# Patient Record
Sex: Female | Born: 1945
Health system: Southern US, Community
[De-identification: ages and names within clinical notes are randomized; demographics above are authoritative.]

## PROBLEM LIST (undated history)

## (undated) DIAGNOSIS — M109 Gout, unspecified: Secondary | ICD-10-CM

## (undated) DIAGNOSIS — K219 Gastro-esophageal reflux disease without esophagitis: Secondary | ICD-10-CM

## (undated) DIAGNOSIS — G629 Polyneuropathy, unspecified: Secondary | ICD-10-CM

## (undated) DIAGNOSIS — M797 Fibromyalgia: Secondary | ICD-10-CM

## (undated) DIAGNOSIS — F329 Major depressive disorder, single episode, unspecified: Secondary | ICD-10-CM

## (undated) DIAGNOSIS — E785 Hyperlipidemia, unspecified: Secondary | ICD-10-CM

## (undated) DIAGNOSIS — N289 Disorder of kidney and ureter, unspecified: Secondary | ICD-10-CM

## (undated) DIAGNOSIS — M4800 Spinal stenosis, site unspecified: Secondary | ICD-10-CM

## (undated) DIAGNOSIS — E039 Hypothyroidism, unspecified: Secondary | ICD-10-CM

## (undated) DIAGNOSIS — F32A Depression, unspecified: Secondary | ICD-10-CM

## (undated) DIAGNOSIS — I10 Essential (primary) hypertension: Secondary | ICD-10-CM

## (undated) DIAGNOSIS — M1990 Unspecified osteoarthritis, unspecified site: Secondary | ICD-10-CM

## (undated) DIAGNOSIS — F419 Anxiety disorder, unspecified: Secondary | ICD-10-CM

## (undated) DIAGNOSIS — K760 Fatty (change of) liver, not elsewhere classified: Secondary | ICD-10-CM

## (undated) HISTORY — PX: LUMBAR FUSION: SHX111

## (undated) HISTORY — DX: Anxiety disorder, unspecified: F41.9

## (undated) HISTORY — DX: Gout, unspecified: M10.9

## (undated) HISTORY — DX: Disorder of kidney and ureter, unspecified: N28.9

## (undated) HISTORY — PX: COLONOSCOPY: SHX174

## (undated) HISTORY — DX: Spinal stenosis, site unspecified: M48.00

## (undated) HISTORY — DX: Gastro-esophageal reflux disease without esophagitis: K21.9

## (undated) HISTORY — DX: Hyperlipidemia, unspecified: E78.5

## (undated) HISTORY — DX: Hypothyroidism, unspecified: E03.9

---

## 1997-04-12 ENCOUNTER — Ambulatory Visit (HOSPITAL_COMMUNITY): Admission: RE | Admit: 1997-04-12 | Discharge: 1997-04-12 | Payer: Self-pay | Admitting: Obstetrics and Gynecology

## 1997-10-18 ENCOUNTER — Ambulatory Visit (HOSPITAL_COMMUNITY): Admission: RE | Admit: 1997-10-18 | Discharge: 1997-10-18 | Payer: Self-pay | Admitting: *Deleted

## 1998-03-23 ENCOUNTER — Other Ambulatory Visit: Admission: RE | Admit: 1998-03-23 | Discharge: 1998-03-23 | Payer: Self-pay | Admitting: *Deleted

## 1998-07-12 ENCOUNTER — Ambulatory Visit (HOSPITAL_COMMUNITY): Admission: RE | Admit: 1998-07-12 | Discharge: 1998-07-12 | Payer: Self-pay | Admitting: *Deleted

## 1998-07-12 ENCOUNTER — Encounter: Payer: Self-pay | Admitting: *Deleted

## 1999-04-25 ENCOUNTER — Other Ambulatory Visit: Admission: RE | Admit: 1999-04-25 | Discharge: 1999-04-25 | Payer: Self-pay | Admitting: Obstetrics and Gynecology

## 1999-07-31 ENCOUNTER — Encounter: Payer: Self-pay | Admitting: *Deleted

## 1999-07-31 ENCOUNTER — Ambulatory Visit (HOSPITAL_COMMUNITY): Admission: RE | Admit: 1999-07-31 | Discharge: 1999-07-31 | Payer: Self-pay | Admitting: *Deleted

## 2000-03-15 ENCOUNTER — Other Ambulatory Visit: Admission: RE | Admit: 2000-03-15 | Discharge: 2000-03-15 | Payer: Self-pay | Admitting: *Deleted

## 2000-06-11 ENCOUNTER — Ambulatory Visit (HOSPITAL_COMMUNITY): Admission: RE | Admit: 2000-06-11 | Discharge: 2000-06-11 | Payer: Self-pay | Admitting: *Deleted

## 2000-08-07 ENCOUNTER — Encounter: Payer: Self-pay | Admitting: *Deleted

## 2000-08-07 ENCOUNTER — Ambulatory Visit (HOSPITAL_COMMUNITY): Admission: RE | Admit: 2000-08-07 | Discharge: 2000-08-07 | Payer: Self-pay | Admitting: *Deleted

## 2001-04-09 ENCOUNTER — Other Ambulatory Visit: Admission: RE | Admit: 2001-04-09 | Discharge: 2001-04-09 | Payer: Self-pay | Admitting: *Deleted

## 2001-09-11 ENCOUNTER — Ambulatory Visit (HOSPITAL_COMMUNITY): Admission: RE | Admit: 2001-09-11 | Discharge: 2001-09-11 | Payer: Self-pay | Admitting: *Deleted

## 2001-09-11 ENCOUNTER — Encounter: Payer: Self-pay | Admitting: *Deleted

## 2001-10-07 ENCOUNTER — Encounter: Payer: Self-pay | Admitting: Family Medicine

## 2001-10-07 ENCOUNTER — Ambulatory Visit (HOSPITAL_COMMUNITY): Admission: RE | Admit: 2001-10-07 | Discharge: 2001-10-07 | Payer: Self-pay | Admitting: Family Medicine

## 2002-05-27 ENCOUNTER — Other Ambulatory Visit: Admission: RE | Admit: 2002-05-27 | Discharge: 2002-05-27 | Payer: Self-pay | Admitting: *Deleted

## 2002-09-28 ENCOUNTER — Encounter: Payer: Self-pay | Admitting: *Deleted

## 2002-09-28 ENCOUNTER — Ambulatory Visit (HOSPITAL_COMMUNITY): Admission: RE | Admit: 2002-09-28 | Discharge: 2002-09-28 | Payer: Self-pay | Admitting: *Deleted

## 2003-06-16 ENCOUNTER — Other Ambulatory Visit: Admission: RE | Admit: 2003-06-16 | Discharge: 2003-06-16 | Payer: Self-pay | Admitting: *Deleted

## 2003-10-08 ENCOUNTER — Ambulatory Visit (HOSPITAL_COMMUNITY): Admission: RE | Admit: 2003-10-08 | Discharge: 2003-10-08 | Payer: Self-pay | Admitting: *Deleted

## 2004-07-18 ENCOUNTER — Other Ambulatory Visit: Admission: RE | Admit: 2004-07-18 | Discharge: 2004-07-18 | Payer: Self-pay | Admitting: *Deleted

## 2004-07-20 ENCOUNTER — Encounter: Admission: RE | Admit: 2004-07-20 | Discharge: 2004-07-20 | Payer: Self-pay | Admitting: Orthopedic Surgery

## 2004-12-07 ENCOUNTER — Ambulatory Visit (HOSPITAL_COMMUNITY): Admission: RE | Admit: 2004-12-07 | Discharge: 2004-12-07 | Payer: Self-pay | Admitting: *Deleted

## 2005-08-09 ENCOUNTER — Other Ambulatory Visit: Admission: RE | Admit: 2005-08-09 | Discharge: 2005-08-09 | Payer: Self-pay | Admitting: Obstetrics & Gynecology

## 2005-08-31 ENCOUNTER — Ambulatory Visit (HOSPITAL_COMMUNITY): Admission: RE | Admit: 2005-08-31 | Discharge: 2005-08-31 | Payer: Self-pay | Admitting: Obstetrics & Gynecology

## 2005-10-05 LAB — HM COLONOSCOPY

## 2005-12-20 ENCOUNTER — Ambulatory Visit (HOSPITAL_COMMUNITY): Admission: RE | Admit: 2005-12-20 | Discharge: 2005-12-20 | Payer: Self-pay | Admitting: Obstetrics & Gynecology

## 2006-08-22 ENCOUNTER — Other Ambulatory Visit: Admission: RE | Admit: 2006-08-22 | Discharge: 2006-08-22 | Payer: Self-pay | Admitting: Obstetrics & Gynecology

## 2006-12-24 ENCOUNTER — Ambulatory Visit (HOSPITAL_COMMUNITY): Admission: RE | Admit: 2006-12-24 | Discharge: 2006-12-24 | Payer: Self-pay | Admitting: Obstetrics & Gynecology

## 2007-08-28 ENCOUNTER — Other Ambulatory Visit: Admission: RE | Admit: 2007-08-28 | Discharge: 2007-08-28 | Payer: Self-pay | Admitting: Obstetrics & Gynecology

## 2007-10-27 ENCOUNTER — Other Ambulatory Visit: Admission: RE | Admit: 2007-10-27 | Discharge: 2007-10-27 | Payer: Self-pay | Admitting: Obstetrics & Gynecology

## 2007-12-25 ENCOUNTER — Ambulatory Visit (HOSPITAL_COMMUNITY): Admission: RE | Admit: 2007-12-25 | Discharge: 2007-12-25 | Payer: Self-pay | Admitting: Family Medicine

## 2008-02-16 ENCOUNTER — Encounter: Admission: RE | Admit: 2008-02-16 | Discharge: 2008-02-16 | Payer: Self-pay | Admitting: Specialist

## 2008-02-19 ENCOUNTER — Encounter: Admission: RE | Admit: 2008-02-19 | Discharge: 2008-02-19 | Payer: Self-pay | Admitting: Specialist

## 2008-03-19 ENCOUNTER — Other Ambulatory Visit: Admission: RE | Admit: 2008-03-19 | Discharge: 2008-03-19 | Payer: Self-pay | Admitting: Obstetrics & Gynecology

## 2008-06-16 ENCOUNTER — Other Ambulatory Visit: Admission: RE | Admit: 2008-06-16 | Discharge: 2008-06-16 | Payer: Self-pay | Admitting: Obstetrics & Gynecology

## 2008-07-23 ENCOUNTER — Encounter: Admission: RE | Admit: 2008-07-23 | Discharge: 2008-07-23 | Payer: Self-pay | Admitting: Orthopedic Surgery

## 2008-09-27 ENCOUNTER — Observation Stay (HOSPITAL_COMMUNITY): Admission: EM | Admit: 2008-09-27 | Discharge: 2008-09-27 | Payer: Self-pay | Admitting: Emergency Medicine

## 2008-12-03 HISTORY — PX: CERVICAL BIOPSY  W/ LOOP ELECTRODE EXCISION: SUR135

## 2008-12-17 ENCOUNTER — Ambulatory Visit (HOSPITAL_COMMUNITY): Admission: RE | Admit: 2008-12-17 | Discharge: 2008-12-17 | Payer: Self-pay | Admitting: Obstetrics & Gynecology

## 2008-12-28 ENCOUNTER — Ambulatory Visit (HOSPITAL_COMMUNITY): Admission: RE | Admit: 2008-12-28 | Discharge: 2008-12-28 | Payer: Self-pay | Admitting: Obstetrics & Gynecology

## 2009-08-24 ENCOUNTER — Encounter: Admission: RE | Admit: 2009-08-24 | Discharge: 2009-08-24 | Payer: Self-pay | Admitting: Nurse Practitioner

## 2009-11-11 ENCOUNTER — Ambulatory Visit (HOSPITAL_COMMUNITY): Admission: RE | Admit: 2009-11-11 | Discharge: 2009-11-11 | Payer: Self-pay | Admitting: Endocrinology

## 2009-11-23 ENCOUNTER — Encounter: Admission: RE | Admit: 2009-11-23 | Discharge: 2009-11-23 | Payer: Self-pay | Admitting: Endocrinology

## 2009-11-23 ENCOUNTER — Other Ambulatory Visit: Admission: RE | Admit: 2009-11-23 | Discharge: 2009-11-23 | Payer: Self-pay | Admitting: Interventional Radiology

## 2009-12-29 ENCOUNTER — Ambulatory Visit (HOSPITAL_COMMUNITY): Admission: RE | Admit: 2009-12-29 | Discharge: 2009-12-29 | Payer: Self-pay | Admitting: Obstetrics & Gynecology

## 2010-03-25 ENCOUNTER — Other Ambulatory Visit: Payer: Self-pay | Admitting: Endocrinology

## 2010-03-25 DIAGNOSIS — E041 Nontoxic single thyroid nodule: Secondary | ICD-10-CM

## 2010-06-11 LAB — URINALYSIS, ROUTINE W REFLEX MICROSCOPIC
Bilirubin Urine: NEGATIVE
Glucose, UA: NEGATIVE mg/dL
Hgb urine dipstick: NEGATIVE
Ketones, ur: NEGATIVE mg/dL
Nitrite: NEGATIVE
Protein, ur: NEGATIVE mg/dL
Specific Gravity, Urine: 1.019 (ref 1.005–1.030)
Urobilinogen, UA: 0.2 mg/dL (ref 0.0–1.0)
pH: 5.5 (ref 5.0–8.0)

## 2010-06-11 LAB — CBC
HCT: 36.4 % (ref 36.0–46.0)
Hemoglobin: 12.8 g/dL (ref 12.0–15.0)
MCHC: 35.1 g/dL (ref 30.0–36.0)
MCV: 92.4 fL (ref 78.0–100.0)
Platelets: 200 10*3/uL (ref 150–400)
RBC: 3.94 MIL/uL (ref 3.87–5.11)
RDW: 13.2 % (ref 11.5–15.5)
WBC: 3.3 10*3/uL — ABNORMAL LOW (ref 4.0–10.5)

## 2010-06-11 LAB — URINE MICROSCOPIC-ADD ON

## 2010-06-11 LAB — DIFFERENTIAL
Basophils Absolute: 0 10*3/uL (ref 0.0–0.1)
Basophils Relative: 1 % (ref 0–1)
Eosinophils Absolute: 0.3 10*3/uL (ref 0.0–0.7)
Eosinophils Relative: 8 % — ABNORMAL HIGH (ref 0–5)
Lymphocytes Relative: 25 % (ref 12–46)
Lymphs Abs: 0.8 10*3/uL (ref 0.7–4.0)
Monocytes Absolute: 0.4 10*3/uL (ref 0.1–1.0)
Monocytes Relative: 12 % (ref 3–12)
Neutro Abs: 1.8 10*3/uL (ref 1.7–7.7)
Neutrophils Relative %: 54 % (ref 43–77)

## 2010-06-11 LAB — POCT I-STAT, CHEM 8
BUN: 15 mg/dL (ref 6–23)
Calcium, Ion: 1.24 mmol/L (ref 1.12–1.32)
Chloride: 103 mEq/L (ref 96–112)
Creatinine, Ser: 1.2 mg/dL (ref 0.4–1.2)
Glucose, Bld: 69 mg/dL — ABNORMAL LOW (ref 70–99)
HCT: 37 % (ref 36.0–46.0)
Hemoglobin: 12.6 g/dL (ref 12.0–15.0)
Potassium: 3.9 mEq/L (ref 3.5–5.1)
Sodium: 139 mEq/L (ref 135–145)
TCO2: 26 mmol/L (ref 0–100)

## 2010-06-11 LAB — CULTURE, BLOOD (ROUTINE X 2)
Culture: NO GROWTH
Culture: NO GROWTH

## 2010-06-11 LAB — URINE CULTURE

## 2010-06-11 LAB — POCT CARDIAC MARKERS
CKMB, poc: <1 ng/mL — ABNORMAL LOW (ref 1.0–8.0)
CKMB, poc: <1 ng/mL — ABNORMAL LOW (ref 1.0–8.0)
Myoglobin, poc: 66.9 ng/mL (ref 12–200)
Myoglobin, poc: 72.3 ng/mL (ref 12–200)
Troponin i, poc: <0.05 ng/mL (ref 0.00–0.09)
Troponin i, poc: <0.05 ng/mL (ref 0.00–0.09)

## 2010-06-11 LAB — D-DIMER, QUANTITATIVE: D-Dimer, Quant: 0.36 ug/mL-FEU (ref 0.00–0.48)

## 2010-06-11 LAB — CK TOTAL AND CKMB (NOT AT ARMC)
CK, MB: 1.6 ng/mL (ref 0.3–4.0)
CK, MB: 1.7 ng/mL (ref 0.3–4.0)
Relative Index: INVALID (ref 0.0–2.5)
Relative Index: INVALID (ref 0.0–2.5)
Total CK: 67 U/L (ref 7–177)
Total CK: 70 U/L (ref 7–177)

## 2010-06-11 LAB — TROPONIN I
Troponin I: 0.01 ng/mL (ref 0.00–0.06)
Troponin I: <0.01 ng/mL (ref 0.00–0.06)

## 2010-06-11 LAB — HEPATIC FUNCTION PANEL
ALT: 48 U/L — ABNORMAL HIGH (ref 0–35)
AST: 48 U/L — ABNORMAL HIGH (ref 0–37)
Albumin: 4 g/dL (ref 3.5–5.2)
Alkaline Phosphatase: 50 U/L (ref 39–117)
Bilirubin, Direct: 0.2 mg/dL (ref 0.0–0.3)
Indirect Bilirubin: 0.8 mg/dL (ref 0.3–0.9)
Total Bilirubin: 1 mg/dL (ref 0.3–1.2)
Total Protein: 6.3 g/dL (ref 6.0–8.3)

## 2010-06-11 LAB — CARDIAC PANEL(CRET KIN+CKTOT+MB+TROPI)
CK, MB: 1.3 ng/mL (ref 0.3–4.0)
Relative Index: INVALID (ref 0.0–2.5)
Total CK: 55 U/L (ref 7–177)
Troponin I: 0.01 ng/mL (ref 0.00–0.06)

## 2010-06-11 LAB — TSH: TSH: 1.886 u[IU]/mL (ref 0.350–4.500)

## 2010-06-11 LAB — GLUCOSE, CAPILLARY: Glucose-Capillary: 101 mg/dL — ABNORMAL HIGH (ref 70–99)

## 2010-07-18 NOTE — H&P (Signed)
NAME:  Patricia Parks, Patricia Parks NO.:  [id number]   MEDICAL RECORD NO.:  [id number]          PATIENT TYPE:  EMS   LOCATION:  [facility]                         FACILITY:  [facility]   PHYSICIAN:  Hollice Espy, M.D.DATE OF BIRTH:  [birth year]   DATE OF ADMISSION:  09/27/2008  DATE OF DISCHARGE:                              HISTORY & PHYSICAL   PRIMARY CARE PHYSICIAN:  Robert A. Nicholos Johns, MD   CHIEF COMPLAINT:  Chest pain.   HISTORY OF PRESENT ILLNESS:  Patricia Parks is a 65 year old white female  with past medical history of hypertension, fibromyalgia, and depression  who presented to her primary care physician's office this morning with  complaints of chest pain.  The patient reports she awoke this morning  with dull pressure-like sensation in the midsternal region.  Pain was  worse with deep inspiration.  Pain was also associated with shortness of  breath and diaphoresis which has now resolved.  The patient denies any  nausea.  She does report fatigue and myalgias x1 week which she has  attributed to her fibromyalgia.  The patient's chest pressure did mildly  improve with sublingual nitroglycerin given in the emergency room.  The  patient will be admitted at this time for further evaluation and  treatment.   PAST MEDICAL HISTORY:  1. Fibromyalgia.  2. Depression, followed by Dr. Evelene Croon.  3. Hypertension.  4. Spinal stenosis followed by Dr. Jillyn Hidden.   MEDICATIONS:  1. Wellbutrin SR 150 mg tablets 3 times p.o. q.a.m.  2. Cymbalta 60 mg p.o. q.a.m.  3. Lyrica 100 mg p.o. b.i.d.  4. Toprol-XL 50 mg p.o. daily.  5. Maxzide 37.5-25 mg p.o. daily.   ALLERGIES:  No known drug allergies.   FAMILY HISTORY:  The patient's mother is alive and 40 years old with  history of hypertension.  The patient's father deceased at 21 years old  with severe Alzheimer dementia.  No family history of CAD per patient  report.   SOCIAL HISTORY:  No history of tobacco use.  The patient reports  occasional EtOH.  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   REVIEW OF SYSTEMS:  As stated in HPI, otherwise negative.   PHYSICAL EXAMINATION:  VITAL SIGNS:  Blood pressure 109/60, heart rate  67, respirations 20, temperature 97.5, and O2 sat is 99% on 2 L.  GENERAL:  This is a well-nourished, well-developed white female in no  acute distress.  HEAD:  Normocephalic and atraumatic.  EYES:  Pupils are equal, round, and reactive to light.  Extraocular  movements are intact.  No scleral icterus or injection.  EARS, NOSE, AND THROAT:  Mucous membranes are moist.  No oropharyngeal  lesions.  NECK:  Supple without any thyromegaly or lymphadenopathy.  No JVD or  carotid bruits.  CHEST:  Symmetrical movement.  Nontender to palpation.  Note, unable to  reproduce midsternal chest pain.  CARDIOVASCULAR:  S1-S2.  Regular rate and rhythm.  No murmurs, rubs, or  gallops.  No lower extremity edema.  RESPIRATORY:  Lung sounds are clear to auscultation bilaterally.  No  wheezes, rales, or crackles.  No increased work of breathing.  GI:  Abdomen is obese, soft, nontender, and nondistended with positive  bowel sounds.  No appreciated masses or hepatosplenomegaly.  MUSCULOSKELETAL:  No joint deformity or swelling.  NEUROLOGIC:  The patient is able to move all extremities x4 without any  motor or sensory deficits.  PSYCHOLOGIC:  The patient is alert and oriented x3 with normal mood and  affect.  SKIN:  Without any suspicious rashes or lesions.   LABORATORY WORK AND X-RAYS:  CBC and BMET within normal limits.  Urinalysis pending at time of admission.  Cardiac point-of-care markers  are negative x2.  D-dimer within normal limits.  Chest x-ray with no  acute findings.   EKG:  Normal sinus rhythm at 66 beats per minute with no signs of  ischemia, however, there are no old available EKGs for comparison.   IMPRESSION AND PLAN:  1. Chest pain.  The patient with both typical and atypical features,       however, given the patient's age and history of hypertension, we      will admit overnight to Telemetry Unit to rule out myocardial      infarction.  We will cycle cardiac enzymes x3, check TSH, and we      will check the patient's fasting lipid profile in the morning for      risk stratification.  If inpatient workup is negative, we would      arrange for outpatient Cardiolite study per primary care physician.      As stated above, the patient's D-dimer, chest x-ray, and EKG are      all within normal limits.  2. Fibromyalgia.  We will continue the patient's home medications,      possible contributing factor to chest pain.  3. Depression.  Stable.  We will continue home medications.  4. Hypertension.  Stable.  We will continue home medications.  5. Prophylaxis.  We will order for PPI and DVT prophylaxis with      unfractionated heparin.      Cordelia Pen, NP      Hollice Espy, M.D.  Electronically Signed    LE/MEDQ  D:  09/27/2008  T:  09/28/2008  Job:  [id number]   cc:   Molly Maduro A. Nicholos Johns, M.D.

## 2010-08-16 ENCOUNTER — Ambulatory Visit
Admission: RE | Admit: 2010-08-16 | Discharge: 2010-08-16 | Disposition: A | Discharge status: Home / Self Care | Payer: PRIVATE HEALTH INSURANCE | Source: Ambulatory Visit | Attending: Endocrinology | Admitting: Endocrinology

## 2010-08-16 DIAGNOSIS — E041 Nontoxic single thyroid nodule: Secondary | ICD-10-CM

## 2010-08-21 ENCOUNTER — Other Ambulatory Visit: Payer: Self-pay | Admitting: Family Medicine

## 2010-08-21 DIAGNOSIS — R071 Chest pain on breathing: Secondary | ICD-10-CM

## 2010-08-28 ENCOUNTER — Ambulatory Visit
Admission: RE | Admit: 2010-08-28 | Discharge: 2010-08-28 | Disposition: A | Discharge status: Home / Self Care | Payer: PRIVATE HEALTH INSURANCE | Source: Ambulatory Visit | Attending: Family Medicine | Admitting: Family Medicine

## 2010-08-28 DIAGNOSIS — R071 Chest pain on breathing: Secondary | ICD-10-CM

## 2010-12-26 ENCOUNTER — Other Ambulatory Visit: Payer: Self-pay | Admitting: Neurology

## 2010-12-26 DIAGNOSIS — F441 Dissociative fugue: Secondary | ICD-10-CM

## 2011-01-01 ENCOUNTER — Other Ambulatory Visit: Payer: Self-pay | Admitting: Obstetrics & Gynecology

## 2011-01-01 DIAGNOSIS — Z1231 Encounter for screening mammogram for malignant neoplasm of breast: Secondary | ICD-10-CM

## 2011-01-03 ENCOUNTER — Ambulatory Visit
Admission: RE | Admit: 2011-01-03 | Discharge: 2011-01-03 | Disposition: A | Discharge status: Home / Self Care | Payer: PRIVATE HEALTH INSURANCE | Source: Ambulatory Visit | Attending: Neurology | Admitting: Neurology

## 2011-01-03 DIAGNOSIS — F441 Dissociative fugue: Secondary | ICD-10-CM

## 2011-01-03 MED ORDER — GADOBENATE DIMEGLUMINE 529 MG/ML IV SOLN
20.0000 mL | Freq: Once | INTRAVENOUS | Status: AC | PRN
  Administered 2011-01-03: 20 mL via INTRAVENOUS

## 2011-01-26 ENCOUNTER — Ambulatory Visit (HOSPITAL_COMMUNITY)
Admission: RE | Admit: 2011-01-26 | Discharge: 2011-01-26 | Disposition: A | Discharge status: Home / Self Care | Payer: PRIVATE HEALTH INSURANCE | Source: Ambulatory Visit | Attending: Obstetrics & Gynecology | Admitting: Obstetrics & Gynecology

## 2011-01-26 DIAGNOSIS — Z1231 Encounter for screening mammogram for malignant neoplasm of breast: Secondary | ICD-10-CM

## 2011-05-18 ENCOUNTER — Emergency Department (HOSPITAL_COMMUNITY)
Admission: EM | Admit: 2011-05-18 | Discharge: 2011-05-19 | Disposition: A | Discharge status: Home / Self Care | Payer: PRIVATE HEALTH INSURANCE | Attending: Emergency Medicine | Admitting: Emergency Medicine

## 2011-05-18 ENCOUNTER — Encounter (HOSPITAL_COMMUNITY): Payer: Self-pay | Admitting: *Deleted

## 2011-05-18 DIAGNOSIS — F3289 Other specified depressive episodes: Secondary | ICD-10-CM | POA: Insufficient documentation

## 2011-05-18 DIAGNOSIS — Z79899 Other long term (current) drug therapy: Secondary | ICD-10-CM | POA: Insufficient documentation

## 2011-05-18 DIAGNOSIS — I10 Essential (primary) hypertension: Secondary | ICD-10-CM | POA: Insufficient documentation

## 2011-05-18 DIAGNOSIS — E039 Hypothyroidism, unspecified: Secondary | ICD-10-CM | POA: Insufficient documentation

## 2011-05-18 DIAGNOSIS — F329 Major depressive disorder, single episode, unspecified: Secondary | ICD-10-CM | POA: Insufficient documentation

## 2011-05-18 DIAGNOSIS — IMO0001 Reserved for inherently not codable concepts without codable children: Secondary | ICD-10-CM | POA: Insufficient documentation

## 2011-05-18 DIAGNOSIS — M7989 Other specified soft tissue disorders: Secondary | ICD-10-CM

## 2011-05-18 HISTORY — DX: Essential (primary) hypertension: I10

## 2011-05-18 HISTORY — DX: Fibromyalgia: M79.7

## 2011-05-18 HISTORY — DX: Depression, unspecified: F32.A

## 2011-05-18 HISTORY — DX: Major depressive disorder, single episode, unspecified: F32.9

## 2011-05-18 NOTE — ED Notes (Signed)
The patient awoke this morning with swelling in her right toes that has turned into right leg tingling through the course of the day.  Pt went to [facility] after hours clinic and was instructed to come to [facility] for possible blood clot.  Pt has no history of the same and upon palpation, her legs are of equal circumference and temperature.

## 2011-05-18 NOTE — Progress Notes (Signed)
Pt here with c/o right foot and right leg swelling. Pt says to the best of her knowledge, both leg and foot were normal yesterday. Today she noticed tingling and swelling to the top of her toes on the right foot, and that her right leg was slightly bigger that the Left. Pt report no pain with symptoms.

## 2011-05-18 NOTE — Discharge Instructions (Signed)
Peripheral Edema You have swelling in your legs (peripheral edema). This swelling is due to excess accumulation of salt and water in your body. Edema may be a sign of heart, kidney or liver disease, or a side effect of a medication. It may also be due to problems in the leg veins. Elevating your legs and using special support stockings may be very helpful, if the cause of the swelling is due to poor venous circulation. Avoid long periods of standing, whatever the cause. Treatment of edema depends on identifying the cause. Chips, pretzels, pickles and other salty foods should be avoided. Restricting salt in your diet is almost always needed. Water pills (diuretics) are often used to remove the excess salt and water from your body via urine. These medicines prevent the kidney from reabsorbing sodium. This increases urine flow. Diuretic treatment may also result in lowering of potassium levels in your body. Potassium supplements may be needed if you have to use diuretics daily. Daily weights can help you keep track of your progress in clearing your edema. You should call your caregiver for follow up care as recommended. SEEK IMMEDIATE MEDICAL CARE IF:   You have increased swelling, pain, redness, or heat in your legs.   You develop shortness of breath, especially when lying down.   You develop chest or abdominal pain, weakness, or fainting.   You have a fever.  Document Released: 03/29/2004 Document Revised: 02/08/2011 Document Reviewed: 03/09/2009 ExitCare Patient Information 2012 [employer]. 

## 2011-05-18 NOTE — ED Provider Notes (Signed)
History     CSN: [id number]  Arrival date & time 05/18/11  2039   First MD Initiated Contact with Patient 05/18/11 2300      Chief Complaint  Patient presents with  . Leg Swelling    possible blood clot    (Consider location/radiation/quality/duration/timing/severity/associated sxs/prior treatment) HPI Patient is a 66 year old female who presents today complaining of right lower extremity swelling.  Patient was seen at an urgent care clinic due to concerns over a sinus infection and was then referred here. Apparently at that visit the patient had a right calf that measured 2 cm greater in diameter the left calf. Patient has no history of recent immobilization. She is normally able to worry. She's not a smoker and is not on any exogenous hormones. She has no history of cancer. Here she and her husband both feel that the right lower extremity is swollen but there is no pitting edema and the right lower extremity is warm and well perfused. Patient has no pain with this and no calf tenderness. She feels that the swelling is notable over the dorsum of the right foot and at the ankle only. I appreciate a minimal difference which may be physiologic between the legs. She has no history of DVT or PE. She has no family history of this. Patient feels that the swelling may be better at this time. There's been a recent injury. There are no other associated or modifying factors.   Past Medical History  Diagnosis Date  . Thyroid disease     hypothyroidism  . Hypertension   . Depression   . Fibromyalgia     History reviewed. No pertinent past surgical history.  History reviewed. No pertinent family history.  History  Substance Use Topics  . Smoking status: Not on file  . Smokeless tobacco: Not on file  . Alcohol Use: No    OB History    Grav Para Term Preterm Abortions TAB SAB Ect Mult Living                  Review of Systems  Constitutional: Negative.   HENT: Negative.   Eyes:  Negative.   Respiratory: Negative.   Cardiovascular: Positive for leg swelling.  Gastrointestinal: Negative.   Genitourinary: Negative.   Musculoskeletal: Negative.   Skin: Negative.   Neurological: Negative.   Hematological: Negative.   Psychiatric/Behavioral: Negative.   All other systems reviewed and are negative.    Allergies  Review of patient's allergies indicates no known allergies.  Home Medications   Current Outpatient Rx  Name Route Sig Dispense Refill  . DULOXETINE HCL 60 MG PO CPEP Oral Take 60 mg by mouth daily.    Marland Kitchen HYDROCODONE-ACETAMINOPHEN 5-325 MG PO TABS Oral Take 1 tablet by mouth every 6 (six) hours as needed. PAIN    . LEVOTHYROXINE SODIUM 75 MCG PO TABS Oral Take 75 mcg by mouth daily.    Marland Kitchen METOPROLOL TARTRATE 50 MG PO TABS Oral Take 50 mg by mouth daily.     Marland Kitchen PREGABALIN 100 MG PO CAPS Oral Take 100 mg by mouth 2 (two) times daily.    . TRIAMTERENE-HCTZ 37.5-25 MG PO CAPS Oral Take 1 capsule by mouth every morning.    Marland Kitchen VITAMIN D (ERGOCALCIFEROL) 50000 UNITS PO CAPS Oral Take 50,000 Units by mouth every 7 (seven) days. SUNDAYS      BP 148/91  Pulse 77  Temp(Src) 98.1 F (36.7 C) (Oral)  Resp 19  SpO2 100%  Physical Exam  Nursing note and vitals reviewed. GEN: Well-developed, well-nourished female in no distress HEENT: Atraumatic, normocephalic.  EYES: PERRLA BL, no scleral icterus. NECK: Trachea midline, full ROM CV: regular rate and rhythm.  PULM: No respiratory distress.   Neuro: cranial nerves 2-12 intact, no abnormalities of strength or sensation, A and O x 3 MSK: Patient moves all 4 extremities symmetrically, no deformity, edema, or injury noted Skin: No rashes petechiae, purpura, or jaundice Psych: no abnormality of mood   ED Course  Procedures (including critical care time)  Labs Reviewed - No data to display No results found.   1. Leg swelling       MDM  Patient was evaluated by myself. Patient had no risks for DVT via  Well's DVT decision rule.  She also did not have exam consistent with DVT on my valuation. Patient is hemodynamically stable. She has really no chest pain or shortness of breath. At this time there was no ultrasound available for lower extremity Doppler. Given the very low risk of the patient's having this diagnosis I did not feel that a d-dimer was appropriate. My reasoning for this was that if it had been positive I did not think the benefit of being on Lovenox overnight outweigh the risks. The patient was offered to be scheduled for an ultrasound in the morning if she and her husband felt that she needed this. I also offered the possibility of plain film if patient felt she may have had an injury that she cannot recall. Patient and family declined both of these and stated that there was nothing else that they needed this evening. Patient was discharged home in good condition and told to followup with her primary care physician if her symptoms persist or change.        Cyndra Numbers, MD 05/18/11 [phone]

## 2011-12-18 LAB — HM PAP SMEAR: HM Pap smear: NEGATIVE

## 2012-02-11 ENCOUNTER — Other Ambulatory Visit: Payer: Self-pay | Admitting: Obstetrics & Gynecology

## 2012-02-11 DIAGNOSIS — Z1231 Encounter for screening mammogram for malignant neoplasm of breast: Secondary | ICD-10-CM

## 2012-03-04 ENCOUNTER — Ambulatory Visit (HOSPITAL_COMMUNITY)
Admission: RE | Admit: 2012-03-04 | Discharge: 2012-03-04 | Disposition: A | Discharge status: Home / Self Care | Payer: PRIVATE HEALTH INSURANCE | Source: Ambulatory Visit | Attending: Obstetrics & Gynecology | Admitting: Obstetrics & Gynecology

## 2012-03-04 DIAGNOSIS — Z1231 Encounter for screening mammogram for malignant neoplasm of breast: Secondary | ICD-10-CM

## 2012-03-11 ENCOUNTER — Other Ambulatory Visit: Payer: Self-pay | Admitting: Obstetrics & Gynecology

## 2012-03-11 DIAGNOSIS — R928 Other abnormal and inconclusive findings on diagnostic imaging of breast: Secondary | ICD-10-CM

## 2012-03-17 ENCOUNTER — Ambulatory Visit
Admission: RE | Admit: 2012-03-17 | Discharge: 2012-03-17 | Disposition: A | Discharge status: Home / Self Care | Payer: PRIVATE HEALTH INSURANCE | Source: Ambulatory Visit | Attending: Obstetrics & Gynecology | Admitting: Obstetrics & Gynecology

## 2012-03-17 DIAGNOSIS — R928 Other abnormal and inconclusive findings on diagnostic imaging of breast: Secondary | ICD-10-CM

## 2012-03-17 LAB — HM MAMMOGRAPHY: HM Mammogram: ABNORMAL

## 2012-05-14 ENCOUNTER — Other Ambulatory Visit: Payer: Self-pay | Admitting: Endocrinology

## 2012-05-14 DIAGNOSIS — E049 Nontoxic goiter, unspecified: Secondary | ICD-10-CM

## 2012-05-20 ENCOUNTER — Other Ambulatory Visit: Payer: PRIVATE HEALTH INSURANCE

## 2012-07-14 ENCOUNTER — Ambulatory Visit
Admission: RE | Admit: 2012-07-14 | Discharge: 2012-07-14 | Disposition: A | Discharge status: Home / Self Care | Payer: PRIVATE HEALTH INSURANCE | Source: Ambulatory Visit | Attending: Endocrinology | Admitting: Endocrinology

## 2012-07-14 DIAGNOSIS — E049 Nontoxic goiter, unspecified: Secondary | ICD-10-CM

## 2012-08-11 ENCOUNTER — Other Ambulatory Visit: Payer: Self-pay | Admitting: Obstetrics & Gynecology

## 2012-08-11 ENCOUNTER — Ambulatory Visit (INDEPENDENT_AMBULATORY_CARE_PROVIDER_SITE_OTHER): Payer: PRIVATE HEALTH INSURANCE | Admitting: Nurse Practitioner

## 2012-08-11 ENCOUNTER — Encounter: Payer: Self-pay | Admitting: Nurse Practitioner

## 2012-08-11 VITALS — BP 130/60 | HR 74 | Temp 97.8°F | Resp 14 | Ht 65.0 in | Wt 208.8 lb

## 2012-08-11 DIAGNOSIS — N390 Urinary tract infection, site not specified: Secondary | ICD-10-CM

## 2012-08-11 DIAGNOSIS — N63 Unspecified lump in unspecified breast: Secondary | ICD-10-CM

## 2012-08-11 LAB — POCT URINALYSIS DIPSTICK
Leukocytes, UA: NEGATIVE
Spec Grav, UA: 1.02
Urobilinogen, UA: NEGATIVE
pH, UA: 6

## 2012-08-11 MED ORDER — CIPROFLOXACIN HCL 500 MG PO TABS: 500.0000 mg | ORAL_TABLET | Freq: Two times a day (BID) | ORAL | Status: DC

## 2012-08-11 NOTE — Progress Notes (Signed)
Subjective:     [name], female   DOB: 1945/11/17, 67 y.o.   MRN: [id number]  HPI  This 67 yo WM Fe complains of strong foul odor to urine on and off for past several days.  She denies vaginal discharge. Some suprapubic pressure but no flank pain. No fever or chills.  She had a bad case of diarrhea a few weeks ago related to flare of IBS.  No association with sexual activity. She feels well otherwise.   Review of Systems  Constitutional: Negative for appetite change and fatigue.  Respiratory: Negative.   Cardiovascular: Negative.   Gastrointestinal:       Diarrhea a few weeks ago. History of IBS.  Genitourinary: Positive for urgency and frequency. Negative for dysuria, flank pain, vaginal bleeding, vaginal discharge, vaginal pain and dyspareunia.       Strong urinary odor. No vaginal discharge. Not sexually active.  Musculoskeletal: Positive for arthralgias.       History of fibromyalgia.  Neurological: Positive for dizziness.       Ongoing problem.  Hematological: Negative.   Psychiatric/Behavioral: Negative.        Objective:   Physical Exam  Constitutional: She is oriented to person, place, and time. She appears well-developed and well-nourished.  Cardiovascular: Normal rate.   Pulmonary/Chest: Effort normal.  Abdominal: Soft. She exhibits no distension and no mass. There is no rebound and no guarding.  Genitourinary:  No vaginal discharge or odor. Atrophic vaginal changes. Urethra is normal.  Still on Vagifem.  Neurological: She is alert and oriented to person, place, and time.  Psychiatric: She has a normal mood and affect. Her behavior is normal. Judgment and thought content normal.       Assessment:     R/O UTI History of same in September 2013 and did not do well on Macrobid and repeat C & S changed to Cipro.     Plan:     Cipro 500 mg bid pending urine culture Increase po fluids If symptoms worsens to CB Increase po fluids

## 2012-08-11 NOTE — Patient Instructions (Signed)
Urinary Tract Infection  Urinary tract infections (UTIs) can develop anywhere along your urinary tract. Your urinary tract is your body's drainage system for removing wastes and extra water. Your urinary tract includes two kidneys, two ureters, a bladder, and a urethra. Your kidneys are a pair of bean-shaped organs. Each kidney is about the size of your fist. They are located below your ribs, one on each side of your spine.  CAUSES  Infections are caused by microbes, which are microscopic organisms, including fungi, viruses, and bacteria. These organisms are so small that they can only be seen through a microscope. Bacteria are the microbes that most commonly cause UTIs.  SYMPTOMS   Symptoms of UTIs may vary by age and gender of the patient and by the location of the infection. Symptoms in young women typically include a frequent and intense urge to urinate and a painful, burning feeling in the bladder or urethra during urination. Older women and men are more likely to be tired, shaky, and weak and have muscle aches and abdominal pain. A fever may mean the infection is in your kidneys. Other symptoms of a kidney infection include pain in your back or sides below the ribs, nausea, and vomiting.  DIAGNOSIS  To diagnose a UTI, your caregiver will ask you about your symptoms. Your caregiver also will ask to provide a urine sample. The urine sample will be tested for bacteria and white blood cells. White blood cells are made by your body to help fight infection.  TREATMENT   Typically, UTIs can be treated with medication. Because most UTIs are caused by a bacterial infection, they usually can be treated with the use of antibiotics. The choice of antibiotic and length of treatment depend on your symptoms and the type of bacteria causing your infection.  HOME CARE INSTRUCTIONS   If you were prescribed antibiotics, take them exactly as your caregiver instructs you. Finish the medication even if you feel better after you  have only taken some of the medication.   Drink enough water and fluids to keep your urine clear or pale yellow.   Avoid caffeine, tea, and carbonated beverages. They tend to irritate your bladder.   Empty your bladder often. Avoid holding urine for long periods of time.   Empty your bladder before and after sexual intercourse.   After a bowel movement, women should cleanse from front to back. Use each tissue only once.  SEEK MEDICAL CARE IF:    You have back pain.   You develop a fever.   Your symptoms do not begin to resolve within 3 days.  SEEK IMMEDIATE MEDICAL CARE IF:    You have severe back pain or lower abdominal pain.   You develop chills.   You have nausea or vomiting.   You have continued burning or discomfort with urination.  MAKE SURE YOU:    Understand these instructions.   Will watch your condition.   Will get help right away if you are not doing well or get worse.  Document Released: 11/29/2004 Document Revised: 08/21/2011 Document Reviewed: 03/30/2011  ExitCare Patient Information 2014 [employer].

## 2012-08-12 NOTE — Progress Notes (Signed)
Reviewed personally.  M. Suzanne Dennison Mcdaid, MD.  

## 2012-08-13 LAB — URINE CULTURE
Colony Count: NO GROWTH
Organism ID, Bacteria: NO GROWTH

## 2012-08-14 ENCOUNTER — Telehealth: Payer: Self-pay | Admitting: *Deleted

## 2012-08-14 NOTE — Telephone Encounter (Signed)
Message copied by Osie Bond on Thu Aug 14, 2012 10:19 AM ------      Message from: Ria Comment R      Created: Wed Aug 13, 2012  6:13 PM       Let patient know urine culture is negative.  Finish 3 days of med's then stop. No TOC is needed.  Have her to still increase po fluids. ------

## 2012-08-14 NOTE — Telephone Encounter (Signed)
LVM for pt to return my call in regards to urine culture results.

## 2012-08-18 ENCOUNTER — Telehealth: Payer: Self-pay | Admitting: *Deleted

## 2012-08-18 NOTE — Telephone Encounter (Signed)
Pt is aware of negative urine culture results and is agreeable to continue 3 days of meds and then stop. Pt states she feels good and deny's and other symptoms.

## 2012-08-18 NOTE — Telephone Encounter (Signed)
Patient returning Tiffany's call.

## 2012-08-18 NOTE — Telephone Encounter (Signed)
Message copied by Osie Bond on Mon Aug 18, 2012  2:56 PM ------      Message from: Ria Comment R      Created: Wed Aug 13, 2012  6:13 PM       Let patient know urine culture is negative.  Finish 3 days of med's then stop. No TOC is needed.  Have her to still increase po fluids. ------

## 2012-09-15 ENCOUNTER — Ambulatory Visit
Admission: RE | Admit: 2012-09-15 | Discharge: 2012-09-15 | Disposition: A | Discharge status: Home / Self Care | Payer: PRIVATE HEALTH INSURANCE | Source: Ambulatory Visit | Attending: Obstetrics & Gynecology | Admitting: Obstetrics & Gynecology

## 2012-09-15 DIAGNOSIS — N63 Unspecified lump in unspecified breast: Secondary | ICD-10-CM

## 2012-09-16 NOTE — Progress Notes (Signed)
Athena recall completed and new recall in EPIC for 03-05-2013.

## 2012-12-25 ENCOUNTER — Other Ambulatory Visit: Payer: Self-pay | Admitting: Obstetrics & Gynecology

## 2012-12-25 ENCOUNTER — Encounter: Payer: Self-pay | Admitting: Obstetrics & Gynecology

## 2012-12-25 ENCOUNTER — Ambulatory Visit (INDEPENDENT_AMBULATORY_CARE_PROVIDER_SITE_OTHER): Payer: PRIVATE HEALTH INSURANCE | Admitting: Obstetrics & Gynecology

## 2012-12-25 VITALS — BP 138/80 | HR 64 | Resp 12 | Ht 65.5 in | Wt 213.2 lb

## 2012-12-25 DIAGNOSIS — N63 Unspecified lump in unspecified breast: Secondary | ICD-10-CM

## 2012-12-25 DIAGNOSIS — Z01419 Encounter for gynecological examination (general) (routine) without abnormal findings: Secondary | ICD-10-CM

## 2012-12-25 DIAGNOSIS — M899 Disorder of bone, unspecified: Secondary | ICD-10-CM

## 2012-12-25 DIAGNOSIS — M8580 Other specified disorders of bone density and structure, unspecified site: Secondary | ICD-10-CM

## 2012-12-25 DIAGNOSIS — Z124 Encounter for screening for malignant neoplasm of cervix: Secondary | ICD-10-CM

## 2012-12-25 DIAGNOSIS — Z0000 Encounter for general adult medical examination without abnormal findings: Secondary | ICD-10-CM

## 2012-12-25 LAB — POCT URINALYSIS DIPSTICK
Bilirubin, UA: NEGATIVE
Blood, UA: NEGATIVE
Glucose, UA: NEGATIVE
Ketones, UA: NEGATIVE
Nitrite, UA: NEGATIVE
Protein, UA: NEGATIVE
Urobilinogen, UA: NEGATIVE
pH, UA: 7

## 2012-12-25 NOTE — Patient Instructions (Signed)

## 2012-12-25 NOTE — Addendum Note (Signed)
Addended by: Jerene Bears on: 12/25/2012 11:41 AM   Modules accepted: Orders

## 2012-12-25 NOTE — Progress Notes (Signed)
67 y.o. [id number] MarriedCaucasianF here for annual exam.  No vaginal bleeding.  Needs scheduling for MMG.  Being followed for 5mm nodule on right.  Also needs BMD.  Patient saw Dr. Nicholos Johns recently.  Newly on cholesterol medication.  Has appt again in December.     Patient's last menstrual period was 03/05/2000.          Sexually active: no  The current method of family planning is none.    Exercising: yes  cardio machines and floor stretching Smoker:  no  Health Maintenance: Pap:  12/18/11 WNL/negative HR HPV History of abnormal Pap:  yes MMG:  03/04/12-MMG, 03/17/12 right diag and [place], 09/15/12 6 month f/u right diag and us-bilateral diag due 1/15 Colonoscopy:  10/05/05 repeat in 10 years BMD:   10/10 TDaP:  9/10 Screening Labs: PCP, Hb today: PCP, Urine today: WBC-1+, PH-7.0   reports that she has never smoked. She has never used smokeless tobacco. She reports that she drinks about 0.5 ounces of alcohol per week. She reports that she does not use illicit drugs.  Past Medical History  Diagnosis Date  . Thyroid disease     hypothyroidism  . Hypertension   . Depression   . Fibromyalgia   . Anxiety   . GERD (gastroesophageal reflux disease)   . Spinal stenosis     Past Surgical History  Procedure Laterality Date  . Cervical biopsy  w/ loop electrode excision  12/2008    hx CIN II    Current Outpatient Prescriptions  Medication Sig Dispense Refill  . clonazePAM (KLONOPIN) 1 MG tablet 1 mg as needed.      . DULoxetine (CYMBALTA) 30 MG capsule Take 30 mg by mouth. 4 times daily      . fish oil-omega-3 fatty acids 1000 MG capsule Take 2 g by mouth daily.      Marland Kitchen levothyroxine (SYNTHROID, LEVOTHROID) 75 MCG tablet Take 75 mcg by mouth daily.      . metoprolol (LOPRESSOR) 50 MG tablet Take 50 mg by mouth daily.       . NON FORMULARY Sockeye salmon 50mg       . pravastatin (PRAVACHOL) 40 MG tablet 40 mg daily.      . pregabalin (LYRICA) 100 MG capsule Take 100 mg by mouth 2 (two) times  daily.      Marland Kitchen triamterene-hydrochlorothiazide (DYAZIDE) 37.5-25 MG per capsule Take 1 capsule by mouth every morning.       No current facility-administered medications for this visit.    Family History  Problem Relation Age of Onset  . Osteoporosis Mother   . Hypertension Mother   . Uterine cancer Mother   . Breast cancer Sister   . Osteoporosis Sister   . Diabetes Father     ROS:  Pertinent items are noted in HPI.  Otherwise, a comprehensive ROS was negative.  Exam:   BP 138/80  Pulse 64  Resp 12  Ht 5' 5.5" (1.664 m)  Wt 213 lb 3.2 oz (96.707 kg)  BMI 34.93 kg/m2  LMP 03/05/2000    Height: 5' 5.5" (166.4 cm)  Ht Readings from Last 3 Encounters:  12/25/12 5' 5.5" (1.664 m)  08/11/12 5\' 5"  (1.651 m)    General appearance: alert, cooperative and appears stated age Head: Normocephalic, without obvious abnormality, atraumatic Neck: no adenopathy, supple, symmetrical, trachea midline and thyroid normal to inspection and palpation Lungs: clear to auscultation bilaterally Breasts: normal appearance, no masses or tenderness Heart: regular rate and rhythm Abdomen: soft,  non-tender; bowel sounds normal; no masses,  no organomegaly Extremities: extremities normal, atraumatic, no cyanosis or edema Skin: Skin color, texture, turgor normal. No rashes or lesions Lymph nodes: Cervical, supraclavicular, and axillary nodes normal. No abnormal inguinal nodes palpated Neurologic: Grossly normal   Pelvic: External genitalia:  no lesions              Urethra:  normal appearing urethra with no masses, tenderness or lesions              Bartholins and Skenes: normal                 Vagina: normal appearing vagina with normal color and discharge, no lesions              Cervix: no lesions              Pap taken: yes Bimanual Exam:  Uterus:  normal size, contour, position, consistency, mobility, non-tender              Adnexa: normal adnexa and no mass, fullness, tenderness                Rectovaginal: Confirms               Anus:  normal sphincter tone, no lesions  A:  Well Woman with normal exam H/o CIN 2, LEEP 2010.  Yearly pap smears indicated. Hypertension Elevated lipids  P:   Mammogram yearly.  Scheduled January 2015 follow-up. pap smear today.  H/O CIN 2. Sees Dr. Nicholos Johns.  Labs with him.  Has follow-up in December after newly starting cholesterol medication. return annually or prn  An After Visit Summary was printed and given to the patient.

## 2012-12-26 LAB — IPS PAP SMEAR ONLY

## 2012-12-29 ENCOUNTER — Ambulatory Visit (HOSPITAL_COMMUNITY)
Admission: RE | Admit: 2012-12-29 | Discharge: 2012-12-29 | Disposition: A | Discharge status: Home / Self Care | Payer: PRIVATE HEALTH INSURANCE | Source: Ambulatory Visit | Attending: Obstetrics & Gynecology | Admitting: Obstetrics & Gynecology

## 2012-12-29 DIAGNOSIS — Z13820 Encounter for screening for osteoporosis: Secondary | ICD-10-CM | POA: Insufficient documentation

## 2012-12-29 DIAGNOSIS — Z780 Asymptomatic menopausal state: Secondary | ICD-10-CM | POA: Insufficient documentation

## 2012-12-29 DIAGNOSIS — M8580 Other specified disorders of bone density and structure, unspecified site: Secondary | ICD-10-CM

## 2013-01-11 ENCOUNTER — Other Ambulatory Visit: Payer: Self-pay | Admitting: Gynecology

## 2013-01-12 ENCOUNTER — Other Ambulatory Visit: Payer: Self-pay

## 2013-01-12 ENCOUNTER — Telehealth: Payer: Self-pay

## 2013-01-12 NOTE — Telephone Encounter (Signed)
lmtcb-to discuss BMD results. 

## 2013-02-23 ENCOUNTER — Telehealth: Payer: Self-pay | Admitting: Obstetrics & Gynecology

## 2013-02-23 NOTE — Telephone Encounter (Signed)
Patient left message Friday trying to call kelly back. No message in system.

## 2013-03-16 NOTE — Telephone Encounter (Signed)
Patient notified of BMD results.

## 2013-04-13 ENCOUNTER — Other Ambulatory Visit: Payer: Self-pay | Admitting: Obstetrics & Gynecology

## 2013-04-13 ENCOUNTER — Ambulatory Visit
Admission: RE | Admit: 2013-04-13 | Discharge: 2013-04-13 | Disposition: A | Discharge status: Home / Self Care | Payer: Medicare Other | Source: Ambulatory Visit | Attending: Obstetrics & Gynecology | Admitting: Obstetrics & Gynecology

## 2013-04-13 DIAGNOSIS — N63 Unspecified lump in unspecified breast: Secondary | ICD-10-CM

## 2013-08-20 ENCOUNTER — Other Ambulatory Visit: Payer: Self-pay | Admitting: Endocrinology

## 2013-08-20 DIAGNOSIS — E049 Nontoxic goiter, unspecified: Secondary | ICD-10-CM

## 2013-08-21 ENCOUNTER — Ambulatory Visit
Admission: RE | Admit: 2013-08-21 | Discharge: 2013-08-21 | Disposition: A | Discharge status: Home / Self Care | Payer: Medicare Other | Source: Ambulatory Visit | Attending: Endocrinology | Admitting: Endocrinology

## 2013-08-21 DIAGNOSIS — E049 Nontoxic goiter, unspecified: Secondary | ICD-10-CM

## 2013-08-28 ENCOUNTER — Emergency Department (HOSPITAL_COMMUNITY): Payer: Medicare Other

## 2013-08-28 ENCOUNTER — Observation Stay (HOSPITAL_COMMUNITY)
Admission: EM | Admit: 2013-08-28 | Discharge: 2013-08-29 | Disposition: A | Discharge status: Home / Self Care | Payer: Medicare Other | Attending: Internal Medicine | Admitting: Internal Medicine

## 2013-08-28 ENCOUNTER — Encounter (HOSPITAL_COMMUNITY): Payer: Self-pay | Admitting: Emergency Medicine

## 2013-08-28 DIAGNOSIS — E785 Hyperlipidemia, unspecified: Secondary | ICD-10-CM | POA: Insufficient documentation

## 2013-08-28 DIAGNOSIS — F329 Major depressive disorder, single episode, unspecified: Secondary | ICD-10-CM | POA: Insufficient documentation

## 2013-08-28 DIAGNOSIS — R0789 Other chest pain: Principal | ICD-10-CM | POA: Insufficient documentation

## 2013-08-28 DIAGNOSIS — Z79899 Other long term (current) drug therapy: Secondary | ICD-10-CM | POA: Insufficient documentation

## 2013-08-28 DIAGNOSIS — F3289 Other specified depressive episodes: Secondary | ICD-10-CM | POA: Insufficient documentation

## 2013-08-28 DIAGNOSIS — R079 Chest pain, unspecified: Secondary | ICD-10-CM | POA: Diagnosis present

## 2013-08-28 DIAGNOSIS — F411 Generalized anxiety disorder: Secondary | ICD-10-CM

## 2013-08-28 DIAGNOSIS — I10 Essential (primary) hypertension: Secondary | ICD-10-CM | POA: Insufficient documentation

## 2013-08-28 DIAGNOSIS — IMO0001 Reserved for inherently not codable concepts without codable children: Secondary | ICD-10-CM | POA: Insufficient documentation

## 2013-08-28 DIAGNOSIS — E039 Hypothyroidism, unspecified: Secondary | ICD-10-CM | POA: Insufficient documentation

## 2013-08-28 DIAGNOSIS — F419 Anxiety disorder, unspecified: Secondary | ICD-10-CM

## 2013-08-28 DIAGNOSIS — R9431 Abnormal electrocardiogram [ECG] [EKG]: Secondary | ICD-10-CM | POA: Insufficient documentation

## 2013-08-28 LAB — CBC WITH DIFFERENTIAL/PLATELET
Basophils Absolute: 0.1 10*3/uL (ref 0.0–0.1)
Basophils Relative: 1 % (ref 0–1)
Eosinophils Absolute: 0.5 10*3/uL (ref 0.0–0.7)
Eosinophils Relative: 7 % — ABNORMAL HIGH (ref 0–5)
HCT: 43.4 % (ref 36.0–46.0)
Hemoglobin: 15.3 g/dL — ABNORMAL HIGH (ref 12.0–15.0)
Lymphocytes Relative: 35 % (ref 12–46)
Lymphs Abs: 2.3 10*3/uL (ref 0.7–4.0)
MCH: 31 pg (ref 26.0–34.0)
MCHC: 35.3 g/dL (ref 30.0–36.0)
MCV: 88 fL (ref 78.0–100.0)
Monocytes Absolute: 0.5 10*3/uL (ref 0.1–1.0)
Monocytes Relative: 7 % (ref 3–12)
Neutro Abs: 3.4 10*3/uL (ref 1.7–7.7)
Neutrophils Relative %: 50 % (ref 43–77)
Platelets: 296 10*3/uL (ref 150–400)
RBC: 4.93 MIL/uL (ref 3.87–5.11)
RDW: 13.5 % (ref 11.5–15.5)
WBC: 6.7 10*3/uL (ref 4.0–10.5)

## 2013-08-28 LAB — CBC
HCT: 41.7 % (ref 36.0–46.0)
Hemoglobin: 14.7 g/dL (ref 12.0–15.0)
MCH: 31 pg (ref 26.0–34.0)
MCHC: 35.3 g/dL (ref 30.0–36.0)
MCV: 88 fL (ref 78.0–100.0)
Platelets: 239 10*3/uL (ref 150–400)
RBC: 4.74 MIL/uL (ref 3.87–5.11)
RDW: 13.5 % (ref 11.5–15.5)
WBC: 5.6 10*3/uL (ref 4.0–10.5)

## 2013-08-28 LAB — TROPONIN I
Troponin I: <0.3 ng/mL (ref <0.30)
Troponin I: <0.3 ng/mL (ref <0.30)

## 2013-08-28 LAB — CREATININE, SERUM
Creatinine, Ser: 1.01 mg/dL (ref 0.50–1.10)
GFR calc Af Amer: 66 mL/min — ABNORMAL LOW (ref >90)
GFR calc non Af Amer: 57 mL/min — ABNORMAL LOW (ref >90)

## 2013-08-28 LAB — I-STAT TROPONIN, ED: Troponin i, poc: 0 ng/mL (ref 0.00–0.08)

## 2013-08-28 LAB — COMPREHENSIVE METABOLIC PANEL
ALT: 31 U/L (ref 0–35)
AST: 25 U/L (ref 0–37)
Albumin: 4.3 g/dL (ref 3.5–5.2)
Alkaline Phosphatase: 73 U/L (ref 39–117)
BUN: 26 mg/dL — ABNORMAL HIGH (ref 6–23)
CO2: 23 mEq/L (ref 19–32)
Calcium: 9.9 mg/dL (ref 8.4–10.5)
Chloride: 99 mEq/L (ref 96–112)
Creatinine, Ser: 1.03 mg/dL (ref 0.50–1.10)
GFR calc Af Amer: 64 mL/min — ABNORMAL LOW (ref >90)
GFR calc non Af Amer: 55 mL/min — ABNORMAL LOW (ref >90)
Glucose, Bld: 100 mg/dL — ABNORMAL HIGH (ref 70–99)
Potassium: 3.8 mEq/L (ref 3.7–5.3)
Sodium: 139 mEq/L (ref 137–147)
Total Bilirubin: 0.8 mg/dL (ref 0.3–1.2)
Total Protein: 7.4 g/dL (ref 6.0–8.3)

## 2013-08-28 MED ORDER — MORPHINE SULFATE 4 MG/ML IJ SOLN
4.0000 mg | Freq: Once | INTRAMUSCULAR | Status: AC
  Administered 2013-08-28: 4 mg via INTRAVENOUS
  Filled 2013-08-28: qty 1

## 2013-08-28 MED ORDER — ENOXAPARIN SODIUM 30 MG/0.3ML ~~LOC~~ SOLN
30.0000 mg | SUBCUTANEOUS | Status: DC
  Filled 2013-08-28: qty 0.3

## 2013-08-28 MED ORDER — TRIAMTERENE-HCTZ 37.5-25 MG PO CAPS
1.0000 | ORAL_CAPSULE | Freq: Every day | ORAL | Status: DC
  Filled 2013-08-28: qty 1

## 2013-08-28 MED ORDER — PREGABALIN 50 MG PO CAPS
100.0000 mg | ORAL_CAPSULE | Freq: Every day | ORAL | Status: DC
  Administered 2013-08-28 – 2013-08-29 (×2): 100 mg via ORAL
  Filled 2013-08-28 (×2): qty 2

## 2013-08-28 MED ORDER — LEVOTHYROXINE SODIUM 150 MCG PO TABS
150.0000 ug | ORAL_TABLET | Freq: Every day | ORAL | Status: DC
  Administered 2013-08-29: 150 ug via ORAL
  Filled 2013-08-28 (×2): qty 1

## 2013-08-28 MED ORDER — ENOXAPARIN SODIUM 40 MG/0.4ML ~~LOC~~ SOLN
40.0000 mg | SUBCUTANEOUS | Status: DC
  Filled 2013-08-28: qty 0.4

## 2013-08-28 MED ORDER — ONDANSETRON HCL 4 MG/2ML IJ SOLN: 4.0000 mg | Freq: Four times a day (QID) | INTRAMUSCULAR | Status: DC | PRN

## 2013-08-28 MED ORDER — TRAZODONE HCL 50 MG PO TABS
50.0000 mg | ORAL_TABLET | Freq: Every evening | ORAL | Status: DC | PRN
  Administered 2013-08-28: 50 mg via ORAL
  Filled 2013-08-28: qty 1

## 2013-08-28 MED ORDER — MORPHINE SULFATE 2 MG/ML IJ SOLN: 2.0000 mg | INTRAMUSCULAR | Status: DC | PRN

## 2013-08-28 MED ORDER — DULOXETINE HCL 60 MG PO CPEP
90.0000 mg | ORAL_CAPSULE | Freq: Every day | ORAL | Status: DC
  Administered 2013-08-28 – 2013-08-29 (×2): 90 mg via ORAL
  Filled 2013-08-28 (×2): qty 1

## 2013-08-28 MED ORDER — METOPROLOL SUCCINATE ER 50 MG PO TB24
50.0000 mg | ORAL_TABLET | Freq: Every day | ORAL | Status: DC
  Administered 2013-08-28 – 2013-08-29 (×2): 50 mg via ORAL
  Filled 2013-08-28 (×2): qty 1

## 2013-08-28 MED ORDER — HISTAMINE DIHYDROCHLORIDE 0.025 % EX CREA
1.0000 | TOPICAL_CREAM | Freq: Every day | CUTANEOUS | Status: DC | PRN
Start: 2013-08-28 — End: 2013-08-29
  Filled 2013-08-28: qty 59

## 2013-08-28 MED ORDER — ASPIRIN 325 MG PO TABS
325.0000 mg | ORAL_TABLET | Freq: Once | ORAL | Status: AC
  Administered 2013-08-28: 325 mg via ORAL
  Filled 2013-08-28: qty 1

## 2013-08-28 MED ORDER — ONDANSETRON HCL 4 MG/2ML IJ SOLN
4.0000 mg | Freq: Once | INTRAMUSCULAR | Status: AC
  Administered 2013-08-28: 4 mg via INTRAVENOUS
  Filled 2013-08-28: qty 2

## 2013-08-28 MED ORDER — TRIAMTERENE-HCTZ 37.5-25 MG PO TABS
1.0000 | ORAL_TABLET | Freq: Every day | ORAL | Status: DC
  Administered 2013-08-28 – 2013-08-29 (×2): 1 via ORAL
  Filled 2013-08-28 (×2): qty 1

## 2013-08-28 MED ORDER — ACETAMINOPHEN 325 MG PO TABS: 650.0000 mg | ORAL_TABLET | ORAL | Status: DC | PRN

## 2013-08-28 MED ORDER — ASPIRIN EC 81 MG PO TBEC
81.0000 mg | DELAYED_RELEASE_TABLET | Freq: Every day | ORAL | Status: DC
  Administered 2013-08-28 – 2013-08-29 (×2): 81 mg via ORAL
  Filled 2013-08-28 (×2): qty 1

## 2013-08-28 NOTE — Progress Notes (Signed)
Agree with previous RN's assessment. Will continue to monitor patient. Setzer, Patricia Parks  

## 2013-08-28 NOTE — ED Notes (Signed)
Difficulty with EKG capture.  Delayed processing to computer.  Pt on monitor within 5 min of arrival.

## 2013-08-28 NOTE — ED Notes (Signed)
Pt got up this morning and took meds as normal.  Pt went back to bed and woke up with chest pain at around 1 hour ago..  Pain in center of chest.  No pain in jaw or arm.  No cardiac hx.  No reflux hx.

## 2013-08-28 NOTE — ED Notes (Signed)
Pt reports "thyroid level really low", so per her endocrinologist she took double dose of her thyroid meds this morning. Pt reports she then started feeling chest pain and "feeling kinda weird"

## 2013-08-28 NOTE — ED Provider Notes (Signed)
CSN: [id number]     Arrival date & time 08/28/13  0930 History   First MD Initiated Contact with Patient 08/28/13 [phone]     Chief Complaint  Patient presents with  . Chest Pain     (Consider location/radiation/quality/duration/timing/severity/associated sxs/prior Treatment) HPI Comments: Patient is a 68 year old female with history of hypothyroidism, hypertension, fibromyalgia, hyperlipidemia who presents today with chest pain. She reports that the chest pain began one hour ago. It is in her left and central chest. It feels like "I got kicked in the chest". She denies associated shortness of breath, nausea, vomiting. She had an episode of diaphoresis last night which was not associated with chest pain. She saw a cardiologist to 5 years ago when she was having palpitations. She reports that she was told she was fine and that she did not need any medication. She was feeling well when she woke up this morning. Today was the first day that she doubled her dose of levothyroxine. She does not smoke. She denies family history of early heart disease.  The history is provided by the patient. No language interpreter was used.    Past Medical History  Diagnosis Date  . Hypothyroidism        . Hypertension   . Depression   . Fibromyalgia   . Anxiety   . GERD (gastroesophageal reflux disease)   . Spinal stenosis   . Hyperlipidemia    Past Surgical History  Procedure Laterality Date  . Cervical biopsy  w/ loop electrode excision  12/2008    hx CIN II   Family History  Problem Relation Age of Onset  . Osteoporosis Mother   . Hypertension Mother   . Uterine cancer Mother   . Breast cancer Sister   . Osteoporosis Sister   . Diabetes Father    History  Substance Use Topics  . Smoking status: Never Smoker   . Smokeless tobacco: Never Used  . Alcohol Use: 0.5 oz/week    1 drink(s) per week   OB History   Grav Para Term Preterm Abortions TAB SAB Ect Mult Living   2 2 2       2       Review of Systems  Constitutional: Positive for diaphoresis. Negative for fever and chills.  Cardiovascular: Positive for chest pain.  Gastrointestinal: Negative for nausea, vomiting and abdominal pain.  All other systems reviewed and are negative.     Allergies  Review of patient's allergies indicates no known allergies.  Home Medications   Prior to Admission medications   Medication Sig Start Date End Date Taking? Authorizing Provider  clonazePAM (KLONOPIN) 1 MG tablet 1 mg as needed. 11/25/12   Historical Provider, MD  DULoxetine (CYMBALTA) 30 MG capsule Take 30 mg by mouth. 4 times daily    Historical Provider, MD  fish oil-omega-3 fatty acids 1000 MG capsule Take 2 g by mouth daily.    Historical Provider, MD  levothyroxine (SYNTHROID, LEVOTHROID) 75 MCG tablet Take 75 mcg by mouth daily.    Historical Provider, MD  metoprolol (LOPRESSOR) 50 MG tablet Take 50 mg by mouth daily.     Historical Provider, MD  NON FORMULARY Sockeye salmon 50mg     Historical Provider, MD  pravastatin (PRAVACHOL) 40 MG tablet 40 mg daily. 12/05/12   Historical Provider, MD  pregabalin (LYRICA) 100 MG capsule Take 100 mg by mouth 2 (two) times daily.    Historical Provider, MD  triamterene-hydrochlorothiazide (DYAZIDE) 37.5-25 MG per capsule Take 1 capsule by  mouth every morning.    Historical Provider, MD   BP 171/96  Pulse 79  Temp(Src) 97.9 F (36.6 C) (Oral)  Resp 12  SpO2 97%  LMP 03/05/2000 Physical Exam  Nursing note and vitals reviewed. Constitutional: She is oriented to person, place, and time. She appears well-developed and well-nourished. No distress.  HENT:  Head: Normocephalic and atraumatic.  Right Ear: External ear normal.  Left Ear: External ear normal.  Nose: Nose normal.  Mouth/Throat: Oropharynx is clear and moist.  Eyes: Conjunctivae are normal.  Neck: Normal range of motion.  Cardiovascular: Normal rate, regular rhythm, normal heart sounds, intact distal pulses and  normal pulses.   Pulses:      Radial pulses are 2+ on the right side, and 2+ on the left side.  No leg swelling  Pulmonary/Chest: Effort normal and breath sounds normal. No stridor. No respiratory distress. She has no wheezes. She has no rales.  Abdominal: Soft. She exhibits no distension.  Musculoskeletal: Normal range of motion.  Neurological: She is alert and oriented to person, place, and time. She has normal strength.  Skin: Skin is warm and dry. She is not diaphoretic. No erythema.  Psychiatric: She has a normal mood and affect. Her behavior is normal.    ED Course  Procedures (including critical care time) Labs Review Labs Reviewed  CBC WITH DIFFERENTIAL - Abnormal; Notable for the following:    Hemoglobin 15.3 (*)    Eosinophils Relative 7 (*)    All other components within normal limits  COMPREHENSIVE METABOLIC PANEL - Abnormal; Notable for the following:    Glucose, Bld 100 (*)    BUN 26 (*)    GFR calc non Af Amer 55 (*)    GFR calc Af Amer 64 (*)    All other components within normal limits  Randolm Idol, ED    Imaging Review Dg Chest 2 View  08/28/2013   CLINICAL DATA:  Chest pain.  EXAM: CHEST  2 VIEW  COMPARISON:  None.  FINDINGS: The heart size and mediastinal contours are within normal limits. Both lungs are clear. Remainder the exam is unchanged.  IMPRESSION: No active cardiopulmonary disease.   Electronically Signed   By: Marin Olp M.D.   On: 08/28/2013 10:53     EKG Interpretation   Date/Time:  Friday August 28 2013 09:50:03 EDT Ventricular Rate:  80 PR Interval:  155 QRS Duration: 80 QT Interval:  373 QTC Calculation: 430 R Axis:   -39 Text Interpretation:  Sinus rhythm Left atrial enlargement Left axis  deviation Abnormal R-wave progression, late transition Nonspecific T wave  abnormality Lateral leads Baseline wander When compared with ECG of  09/27/2008 Nonspecific T wave abnormality is now Present Confirmed by  [facility]  MD, Nunzio Cory  [phone]) on 08/28/2013 10:04:03 AM      MDM   Final diagnoses:  Chest pain, unspecified chest pain type    Concern for cardiac etiology of Chest Pain. Pt does not meet criteria for CP protocol and a further evaluation is recommended. Pt has been re-evaluated prior to consult and VSS, NAD, heart RRR, lungs CTAB. First round of cardiac enzymes negative. EKG shows nonspecific T wave abnormality.  This case was discussed with Dr. Thurnell Garbe who has seen the patient and agrees with plan to admit.     Elwyn Lade, PA-C 08/28/13 1240

## 2013-08-28 NOTE — H&P (Signed)
[facility] History and Physical  ARTA STUMP IWP:[id number] DOB: [birth year] DOA: 08/28/2013  Referring physician: ER physician PCP: Vena Austria, MD   Chief Complaint: chest pain   HPI:  68 year old female with past medical history of hypothyroidism, hypertension, fibromyalgia, dyslipidemia but stopped taking statins because of muscle aches. Patient presented to [facility] ED 08/28/2013 with reports of midsternal chest pain, 7/10 in intensity, nonradiating, started about one hour prior to this admission. Patient reported chest pain is sharp and feels as if someone has "punched her in the chest". She reported she tube extra dose of Synthroid this morning because she was told by her doctor her thyroid hormone level was low. No reports of shortness of breath, nausea or vomiting. No blood in the stool or urine. No lightheadedness or dizziness or loss of consciousness.  In ED, vital signs are stable. Blood work is essentially unremarkable. Chest x-ray showed no active cardiopulmonary disease. 12-lead EKG showed normal sinus rhythm.  Assessment & Plan    Active Problems:   Chest pain - Rule out ACS. Cycle cardiac enzymes for total of 3 sets. Heart score 4. - 12-lead EKG showed normal sinus rhythm - Obtain 2-D echo, lipid panel   Hypertension - Continue metoprolol 50 mg daily and Maxide daily   Hypothyroidism - Check TSH. Continue Synthroid.  DVT prophylaxis: Lovenox subcutaneous while patient is in hospital  Radiological Exams on Admission: Dg Chest 2 View 08/28/2013   IMPRESSION: No active cardiopulmonary disease.   Electronically Signed   By: Marin Olp M.D.   On: 08/28/2013 10:53    Code Status: Full Family Communication: Plan of care discussed with the patient  Disposition Plan: Admit for further evaluation  Leisa Lenz, MD  [facility] Pager [phone]  Review of Systems:  Constitutional: Negative for fever, chills and malaise/fatigue. Negative for  diaphoresis.  HENT: Negative for hearing loss, ear pain, nosebleeds, congestion, sore throat, neck pain, tinnitus and ear discharge.   Eyes: Negative for blurred vision, double vision, photophobia, pain, discharge and redness.  Respiratory: Negative for cough, hemoptysis, sputum production, shortness of breath, wheezing and stridor.   Cardiovascular: per HPI.  Gastrointestinal: Negative for nausea, vomiting and abdominal pain. Negative for heartburn, constipation, blood in stool and melena.  Genitourinary: Negative for dysuria, urgency, frequency, hematuria and flank pain.  Musculoskeletal: Negative for myalgias, back pain, joint pain and falls.  Skin: Negative for itching and rash.  Neurological: Negative for dizziness and weakness. Negative for tingling, tremors, sensory change, speech change, focal weakness, loss of consciousness and headaches.  Endo/Heme/Allergies: Negative for environmental allergies and polydipsia. Does not bruise/bleed easily.  Psychiatric/Behavioral: Negative for suicidal ideas. The patient is not nervous/anxious.      Past Medical History  Diagnosis Date  . Hypothyroidism        . Hypertension   . Depression   . Fibromyalgia   . Anxiety   . GERD (gastroesophageal reflux disease)   . Spinal stenosis   . Hyperlipidemia    Past Surgical History  Procedure Laterality Date  . Cervical biopsy  w/ loop electrode excision  12/2008    hx CIN II   Social History:  reports that she has never smoked. She has never used smokeless tobacco. She reports that she drinks about .5 ounces of alcohol per week. She reports that she does not use illicit drugs.  No Known Allergies  Family History:  Family History  Problem Relation Age of Onset  . Osteoporosis Mother   . Hypertension Mother   .  Uterine cancer Mother   . Breast cancer Sister   . Osteoporosis Sister   . Diabetes Father      Prior to Admission medications   Medication Sig Start Date End Date Taking?  Authorizing Provider  DULoxetine (CYMBALTA) 30 MG capsule Take 90 mg by mouth daily.    Yes Historical Provider, MD  Histamine Dihydrochloride (AUSTRALIAN DREAM ARTHRITIS) 0.025 % CREA Apply 1 application topically daily as needed (for pain).   Yes Historical Provider, MD  levothyroxine (SYNTHROID, LEVOTHROID) 75 MCG tablet Take 150 mcg by mouth daily after breakfast.    Yes Historical Provider, MD  metoprolol succinate (TOPROL-XL) 50 MG 24 hr tablet Take 50 mg by mouth daily. Take with or immediately following a meal.   Yes Historical Provider, MD  OVER THE COUNTER MEDICATION Take 50 mg by mouth 2 (two) times daily. Sockeye Salmon Oil Tablets   Yes Historical Provider, MD  predniSONE (DELTASONE) 50 MG tablet Take 50 mg by mouth daily with breakfast. For 5 days 08/25/13  Yes Historical Provider, MD  pregabalin (LYRICA) 100 MG capsule Take 100 mg by mouth daily.    Yes Historical Provider, MD  triamterene-hydrochlorothiazide (DYAZIDE) 37.5-25 MG per capsule Take 1 capsule by mouth every morning.   Yes Historical Provider, MD   Physical Exam: Filed Vitals:   08/28/13 0939 08/28/13 1215  BP: 171/96 138/71  Pulse: 79 59  Temp: 97.9 F (36.6 C)   TempSrc: Oral   Resp: 12 11  SpO2: 97% 96%    Physical Exam  Constitutional: Appears well-developed and well-nourished. No distress.  HENT: Normocephalic. No tonsillar erythema or exudates Eyes: Conjunctivae and EOM are normal. PERRLA, no scleral icterus.  Neck: Normal ROM. Neck supple. No JVD. No tracheal deviation. No thyromegaly.  CVS: RRR, S1/S2 +, no murmurs, no gallops, no carotid bruit.  Pulmonary: Effort and breath sounds normal, no stridor, rhonchi, wheezes, rales.  Abdominal: Soft. BS +,  no distension, tenderness, rebound or guarding.  Musculoskeletal: Normal range of motion. No edema and no tenderness.  Lymphadenopathy: No lymphadenopathy noted, cervical, inguinal. Neuro: Alert. Normal reflexes, muscle tone coordination. No focal  neurologic deficits. Skin: Skin is warm and dry. No rash noted. Not diaphoretic. No erythema. No pallor.  Psychiatric: Normal mood and affect. Behavior, judgment, thought content normal.   Labs on Admission:  Basic Metabolic Panel:  Recent Labs Lab 08/28/13 1008  NA 139  K 3.8  CL 99  CO2 23  GLUCOSE 100*  BUN 26*  CREATININE 1.03  CALCIUM 9.9   Liver Function Tests:  Recent Labs Lab 08/28/13 1008  AST 25  ALT 31  ALKPHOS 73  BILITOT 0.8  PROT 7.4  ALBUMIN 4.3   No results found for this basename: LIPASE, AMYLASE,  in the last 168 hours No results found for this basename: AMMONIA,  in the last 168 hours CBC:  Recent Labs Lab 08/28/13 1008  WBC 6.7  NEUTROABS 3.4  HGB 15.3*  HCT 43.4  MCV 88.0  PLT 296   Cardiac Enzymes: No results found for this basename: CKTOTAL, CKMB, CKMBINDEX, TROPONINI,  in the last 168 hours BNP: No components found with this basename: POCBNP,  CBG: No results found for this basename: GLUCAP,  in the last 168 hours  If 7PM-7AM, please contact night-coverage [email or web address] Password [facility] 08/28/2013, 12:57 PM

## 2013-08-29 LAB — LIPID PANEL
Cholesterol: 219 mg/dL — ABNORMAL HIGH (ref 0–200)
HDL: 36 mg/dL — ABNORMAL LOW (ref >39)
LDL Cholesterol: UNDETERMINED mg/dL (ref 0–99)
Total CHOL/HDL Ratio: 6.1 RATIO
Triglycerides: 490 mg/dL — ABNORMAL HIGH (ref <150)
VLDL: UNDETERMINED mg/dL (ref 0–40)

## 2013-08-29 LAB — TROPONIN I: Troponin I: <0.3 ng/mL (ref <0.30)

## 2013-08-29 LAB — TSH: TSH: 3.37 u[IU]/mL (ref 0.350–4.500)

## 2013-08-29 MED ORDER — GEMFIBROZIL 600 MG PO TABS: 600.0000 mg | ORAL_TABLET | Freq: Two times a day (BID) | ORAL | Status: DC

## 2013-08-29 MED ORDER — ASPIRIN 81 MG PO TBEC: 81.0000 mg | DELAYED_RELEASE_TABLET | Freq: Every day | ORAL | Status: DC

## 2013-08-29 MED ORDER — GEMFIBROZIL 600 MG PO TABS
600.0000 mg | ORAL_TABLET | Freq: Two times a day (BID) | ORAL | Status: DC
  Filled 2013-08-29 (×2): qty 1

## 2013-08-29 NOTE — Progress Notes (Signed)
Echocardiogram 2D Echocardiogram has been performed.  Patricia Parks 08/29/2013, 8:36 AM

## 2013-08-29 NOTE — Discharge Summary (Signed)
Physician Discharge Summary  DRU LAUREL [id number]:[id number] DOB: 1945-05-31 DOA: 08/28/2013  PCP: Vena Austria, MD  Admit date: 08/28/2013 Discharge date: 08/29/2013  Time spent: 30 minutes  Recommendations for Outpatient Follow-up:  1. Follow up with PCP in one week.   Discharge Diagnoses:  Active Problems:   Chest pain  Hypertension Hyperlipidemia  Discharge Condition: improved.   Diet recommendation: lowsodium diet.   Filed Weights   08/28/13 1314  Weight: 96.616 kg (213 lb)    History of present illness:  68 year old female with past medical history of hypothyroidism, hypertension, fibromyalgia, dyslipidemia but stopped taking statins because of muscle aches. Patient presented to [facility] ED 08/28/2013 with reports of midsternal chest pain, 7/10 in intensity, nonradiating, started about one hour prior to this admission. Patient reported chest pain is sharp and feels as if someone has "punched her in the chest". She reported she tube extra dose of Synthroid this morning because she was told by her doctor her thyroid hormone level was low. No reports of shortness of breath, nausea or vomiting. No blood in the stool or urine. No lightheadedness or dizziness or loss of consciousness.  In ED, vital signs are stable. Blood work is essentially unremarkable. Chest x-ray showed no active cardiopulmonary disease. 12-lead EKG showed normal sinus rhythm   Hospital Course:  Atypical Chest pain : Admitted to telemetry and ACS ruled out.  - echocardiogram done.  - 12-lead EKG showed normal sinus rhythm  - she is chest pain free.  - recommend outpatient follow up with cardiology for possible stress test.  Hypertension  - Continue metoprolol 50 mg daily and Maxide daily  Hypothyroidism   Continue Synthroid.   Procedures:  echo  Consultations:  none  Discharge Exam: Filed Vitals:   08/29/13 1300  BP: 155/80  Pulse: 67  Temp: 97.7 F (36.5 C)  Resp: 18    General: alert  afebrile comfortable Cardiovascular: s1s2 Respiratory: ctab  Discharge Instructions You were cared for by a hospitalist during your hospital stay. If you have any questions about your discharge medications or the care you received while you were in the hospital after you are discharged, you can call the unit and asked to speak with the hospitalist on call if the hospitalist that took care of you is not available. Once you are discharged, your primary care physician will handle any further medical issues. Please note that NO REFILLS for any discharge medications will be authorized once you are discharged, as it is imperative that you return to your primary care physician (or establish a relationship with a primary care physician if you do not have one) for your aftercare needs so that they can reassess your need for medications and monitor your lab values.  Discharge Instructions   Diet - low sodium heart healthy    Complete by:  As directed      Discharge instructions    Complete by:  As directed   Follow up with cardiology as recommended.            Medication List         aspirin 81 MG EC tablet  Take 1 tablet (81 mg total) by mouth daily.     AUSTRALIAN DREAM ARTHRITIS 0.025 % Crea  Generic drug:  Histamine Dihydrochloride  Apply 1 application topically daily as needed (for pain).     DULoxetine 30 MG capsule  Commonly known as:  CYMBALTA  Take 90 mg by mouth daily.     gemfibrozil 600  MG tablet  Commonly known as:  LOPID  Take 1 tablet (600 mg total) by mouth 2 (two) times daily before a meal.     levothyroxine 75 MCG tablet  Commonly known as:  SYNTHROID, LEVOTHROID  Take 150 mcg by mouth daily after breakfast.     metoprolol succinate 50 MG 24 hr tablet  Commonly known as:  TOPROL-XL  Take 50 mg by mouth daily. Take with or immediately following a meal.     OVER THE COUNTER MEDICATION  Take 50 mg by mouth 2 (two) times daily. Sockeye Salmon Oil Tablets      predniSONE 50 MG tablet  Commonly known as:  DELTASONE  Take 50 mg by mouth daily with breakfast. For 5 days     pregabalin 100 MG capsule  Commonly known as:  LYRICA  Take 100 mg by mouth daily.     triamterene-hydrochlorothiazide 37.5-25 MG per capsule  Commonly known as:  DYAZIDE  Take 1 capsule by mouth every morning.       No Known Allergies     Follow-up Information   Follow up with Vena Austria, MD. Schedule an appointment as soon as possible for a visit in 1 week.   Specialty:  Family Medicine   Contact information:   [address] [place] [address] [phone]       Follow up with [facility]. Schedule an appointment as soon as possible for a visit in 1 week.   Specialty:  Cardiology   Contact information:   [address] [place] [address] [phone]       The results of significant diagnostics from this hospitalization (including imaging, microbiology, ancillary and laboratory) are listed below for reference.    Significant Diagnostic Studies: Dg Chest 2 View  08/28/2013   CLINICAL DATA:  Chest pain.  EXAM: CHEST  2 VIEW  COMPARISON:  None.  FINDINGS: The heart size and mediastinal contours are within normal limits. Both lungs are clear. Remainder the exam is unchanged.  IMPRESSION: No active cardiopulmonary disease.   Electronically Signed   By: Marin Olp M.D.   On: 08/28/2013 10:53   US Soft Tissue Head/neck  08/21/2013   CLINICAL DATA:  GOITER, previous FNA biopsy of right isthmic nodule 11/23/2009.  EXAM: THYROID ULTRASOUND  TECHNIQUE: Ultrasound examination of the thyroid gland and adjacent soft tissues was performed.  COMPARISON:  07/14/2012  FINDINGS: Right thyroid lobe  Measurements: 45 x 26 x 19 mm. There is a 13 x 13 x 11 mm hypoechoic solid nodule, inferior pole (previously 12 x 11 x 14). There is a more superficial 12 x 11 x 10 mm poorly marginated nodule medially.  Left  thyroid lobe  Measurements: 39 x 19 x 14 mm. 14 x 11 x 12 mm hypoechoic solid nodule, mid lobe (previously 12 x8 x 12). Smaller 5 x 4 x 4 mm hypoechoic nodule, inferior pole.  Isthmus  Thickness: 2 mm.  No nodules visualized.  Lymphadenopathy  None visualized.  IMPRESSION: 1. Normal-sized thyroid with bilateral small nodules. Findings do not meet current consensus criteria for biopsy. Follow-up by clinical exam is recommended. If patient has known risk factors for thyroid carcinoma, consider follow-up ultrasound in 12 months. If patient is clinically hyperthyroid, consider nuclear medicine thyroid uptake and scan. This recommendation follows the consensus statement: Management of Thyroid Nodules Detected as [place]: Society of Radiologists in [place]. Radiology 2005; [id number].   Electronically  Signed   By: Arne Cleveland M.D.   On: 08/21/2013 15:36    Microbiology: No results found for this or any previous visit (from the past 240 hour(s)).   Labs: Basic Metabolic Panel:  Recent Labs Lab 08/28/13 1008 08/28/13 1424  NA 139  --   K 3.8  --   CL 99  --   CO2 23  --   GLUCOSE 100*  --   BUN 26*  --   CREATININE 1.03 1.01  CALCIUM 9.9  --    Liver Function Tests:  Recent Labs Lab 08/28/13 1008  AST 25  ALT 31  ALKPHOS 73  BILITOT 0.8  PROT 7.4  ALBUMIN 4.3   No results found for this basename: LIPASE, AMYLASE,  in the last 168 hours No results found for this basename: AMMONIA,  in the last 168 hours CBC:  Recent Labs Lab 08/28/13 1008 08/28/13 1424  WBC 6.7 5.6  NEUTROABS 3.4  --   HGB 15.3* 14.7  HCT 43.4 41.7  MCV 88.0 88.0  PLT 296 239   Cardiac Enzymes:  Recent Labs Lab 08/28/13 1424 08/28/13 1855 08/29/13 0102  TROPONINI <0.30 <0.30 <0.30   BNP: BNP (last 3 results) No results found for this basename: PROBNP,  in the last 8760 hours CBG: No results found for this basename: GLUCAP,  in the last 168  hours     Signed:  Marrion Finan  [facility] 08/29/2013, 4:22 PM

## 2013-08-29 NOTE — ED Provider Notes (Signed)
Medical screening examination/treatment/procedure(s) were performed by non-physician practitioner and as supervising physician I was immediately available for consultation/collaboration.   EKG Interpretation   Date/Time:  Friday August 28 2013 09:50:03 EDT Ventricular Rate:  80 PR Interval:  155 QRS Duration: 80 QT Interval:  373 QTC Calculation: 430 R Axis:   -39 Text Interpretation:  Sinus rhythm Left atrial enlargement Left axis  deviation Abnormal R-wave progression, late transition Nonspecific T wave  abnormality Lateral leads Baseline wander When compared with ECG of  09/27/2008 Nonspecific T wave abnormality is now Present Confirmed by  [facility]  MD, Nunzio Cory ([id number]) on 08/28/2013 10:04:03 AM        Patricia Feller, DO 08/29/13 2158

## 2013-08-29 NOTE — Progress Notes (Signed)
UR Completed.  Ciel Chervenak Jane [phone] 08/29/2013  

## 2013-09-10 ENCOUNTER — Ambulatory Visit (INDEPENDENT_AMBULATORY_CARE_PROVIDER_SITE_OTHER): Payer: Medicare Other | Admitting: Cardiology

## 2013-09-10 ENCOUNTER — Encounter: Payer: Self-pay | Admitting: *Deleted

## 2013-09-10 ENCOUNTER — Encounter: Payer: Self-pay | Admitting: Cardiology

## 2013-09-10 VITALS — BP 151/91 | HR 80 | Ht 66.0 in | Wt 220.4 lb

## 2013-09-10 DIAGNOSIS — R079 Chest pain, unspecified: Secondary | ICD-10-CM

## 2013-09-10 DIAGNOSIS — R072 Precordial pain: Secondary | ICD-10-CM

## 2013-09-10 DIAGNOSIS — I10 Essential (primary) hypertension: Secondary | ICD-10-CM

## 2013-09-10 NOTE — Assessment & Plan Note (Signed)
Blood pressure is mildly elevated today. I have asked her to purchase a blood pressure cuff and follow her blood pressure at home. It runs high her medications can be adjusted by primary care. She does have some left ventricular hypertrophy on her echocardiogram.

## 2013-09-10 NOTE — Progress Notes (Signed)
HPI: 68 year old female for evaluation of chest pain. Admitted in June of 2015 with chest pain. Echocardiogram showed normal LV function, moderate to severe left ventricular hypertrophy, grade 1 diastolic dysfunction, mild left atrial enlargement and small pericardial effusion. Chest x-ray negative. Enzymes negative. Hemoglobin normal. On the day of admission the patient states she had pain in the left sternal area. It was as if "someone hit her in the chest". No radiation. There was no shortness of breath, nausea there was diaphoresis. The pain was not pleuritic, positional or exertional. Lasted 10 minutes and resolved. She had more at the hospital relieved with pain medicine. She has had no chest pain since discharge and denies exertional chest pain. There is no dyspnea on exertion, orthopnea, PND, pedal edema or syncope.  Current Outpatient Prescriptions  Medication Sig Dispense Refill  . aspirin EC 81 MG EC tablet Take 1 tablet (81 mg total) by mouth daily.  30 tablet  1  . DULoxetine (CYMBALTA) 30 MG capsule Take 90 mg by mouth daily.       Marland Kitchen gemfibrozil (LOPID) 600 MG tablet Take 1 tablet (600 mg total) by mouth 2 (two) times daily before a meal.  60 tablet  0  . Histamine Dihydrochloride (AUSTRALIAN DREAM ARTHRITIS) 0.025 % CREA Apply 1 application topically daily as needed (for pain).      Marland Kitchen levothyroxine (SYNTHROID, LEVOTHROID) 75 MCG tablet Take 150 mcg by mouth daily after breakfast.       . metoprolol succinate (TOPROL-XL) 50 MG 24 hr tablet Take 50 mg by mouth daily. Take with or immediately following a meal.      . OVER THE COUNTER MEDICATION Take 50 mg by mouth 2 (two) times daily. Sockeye Salmon Oil Tablets      . pregabalin (LYRICA) 100 MG capsule Take 100 mg by mouth daily.       . traZODone (DESYREL) 50 MG tablet Take 50 mg by mouth at bedtime.      . triamterene-hydrochlorothiazide (DYAZIDE) 37.5-25 MG per capsule Take 1 capsule by mouth every morning.       No current  facility-administered medications for this visit.    No Known Allergies  Past Medical History  Diagnosis Date  . Hypothyroidism        . Hypertension   . Depression   . Fibromyalgia   . Anxiety   . GERD (gastroesophageal reflux disease)   . Spinal stenosis   . Hyperlipidemia     Past Surgical History  Procedure Laterality Date  . Cervical biopsy  w/ loop electrode excision  12/2008    hx CIN II    History   Social History  . Marital Status: Married    Spouse Name: N/A    Number of Children: 2  . Years of Education: N/A   Occupational History  .      Retired   Social History Main Topics  . Smoking status: Former Research scientist (life sciences)  . Smokeless tobacco: Never Used  . Alcohol Use: 0.5 oz/week    1 drink(s) per week     Comment: Occasional  . Drug Use: No  . Sexual Activity: No   Other Topics Concern  . Not on file   Social History Narrative  . No narrative on file    Family History  Problem Relation Age of Onset  . Osteoporosis Mother   . Hypertension Mother   . Uterine cancer Mother   . Breast cancer Sister   . Osteoporosis Sister   .  Diabetes Father   . Heart disease Mother     Atrial fibrillation    ROS: no fevers or chills, productive cough, hemoptysis, dysphasia, odynophagia, melena, hematochezia, dysuria, hematuria, rash, seizure activity, orthopnea, PND, pedal edema, claudication. Remaining systems are negative.  Physical Exam:   Blood pressure 151/91, pulse 80, height 5\' 6"  (1.676 m), weight 220 lb 6.4 oz (99.973 kg), last menstrual period 03/05/2000.  General:  Well developed/obese in NAD Skin warm/dry Patient not depressed No peripheral clubbing Back-normal HEENT-normal/normal eyelids Neck supple/normal carotid upstroke bilaterally; no bruits; no JVD; no thyromegaly chest - CTA/ normal expansion CV - RRR/normal S1 and S2; no murmurs, rubs or gallops;  PMI nondisplaced Abdomen -NT/ND, no HSM, no mass, + bowel sounds, no bruit 2+ femoral  pulses, no bruits Ext-no edema, chords, 2+ DP Neuro-grossly nonfocal  ECG NSR, nonspecific ST changes

## 2013-09-10 NOTE — Patient Instructions (Signed)
Your physician recommends that you schedule a follow-up appointment in: AS NEEDED PENDING TEST RESULTS  Your physician has requested that you have a stress echocardiogram. For further information please visit [email or web address]. Please follow instruction sheet as given.

## 2013-09-10 NOTE — Assessment & Plan Note (Signed)
Symptoms are atypical. Schedule stress echocardiogram for risk stratification.

## 2013-09-22 ENCOUNTER — Encounter (HOSPITAL_COMMUNITY): Payer: Medicare Other

## 2013-09-25 ENCOUNTER — Encounter (HOSPITAL_COMMUNITY): Payer: Medicare Other

## 2013-09-25 ENCOUNTER — Ambulatory Visit (HOSPITAL_COMMUNITY): Payer: Medicare Other | Attending: Cardiology | Admitting: Cardiology

## 2013-09-25 DIAGNOSIS — R072 Precordial pain: Secondary | ICD-10-CM | POA: Insufficient documentation

## 2013-09-25 DIAGNOSIS — R079 Chest pain, unspecified: Secondary | ICD-10-CM

## 2013-09-25 DIAGNOSIS — Z87891 Personal history of nicotine dependence: Secondary | ICD-10-CM | POA: Insufficient documentation

## 2013-09-25 DIAGNOSIS — E669 Obesity, unspecified: Secondary | ICD-10-CM | POA: Insufficient documentation

## 2013-09-25 DIAGNOSIS — I10 Essential (primary) hypertension: Secondary | ICD-10-CM | POA: Diagnosis not present

## 2013-09-25 DIAGNOSIS — E785 Hyperlipidemia, unspecified: Secondary | ICD-10-CM | POA: Insufficient documentation

## 2013-09-25 NOTE — Progress Notes (Signed)
Echo performed. 

## 2013-10-02 ENCOUNTER — Encounter: Payer: Self-pay | Admitting: Cardiology

## 2013-10-02 NOTE — Telephone Encounter (Signed)
This encounter was created in error - please disregard.

## 2013-10-02 NOTE — Telephone Encounter (Signed)
New message     Question about her stress echo results

## 2014-01-04 ENCOUNTER — Encounter: Payer: Self-pay | Admitting: Cardiology

## 2014-01-15 ENCOUNTER — Telehealth: Payer: Self-pay | Admitting: Obstetrics & Gynecology

## 2014-01-15 ENCOUNTER — Ambulatory Visit: Payer: PRIVATE HEALTH INSURANCE | Admitting: Obstetrics & Gynecology

## 2014-01-15 NOTE — Telephone Encounter (Signed)
Pt has [place] [employer] and will need prior authorization for her annual

## 2014-01-18 NOTE — Telephone Encounter (Signed)
Records printed and given to [facility] in billing.

## 2014-01-18 NOTE — Telephone Encounter (Signed)
Patricia Parks- Her insurance company is requesting previous records. Will you provide the office notes from her aex on 12/25/12? Dr Sabra Heck saw her on that date.  Thanks

## 2014-03-25 ENCOUNTER — Other Ambulatory Visit: Payer: Self-pay

## 2014-03-25 DIAGNOSIS — Z1231 Encounter for screening mammogram for malignant neoplasm of breast: Secondary | ICD-10-CM

## 2014-04-15 ENCOUNTER — Ambulatory Visit
Admission: RE | Admit: 2014-04-15 | Discharge: 2014-04-15 | Disposition: A | Discharge status: Home / Self Care | Payer: Medicare Other | Source: Ambulatory Visit

## 2014-04-15 DIAGNOSIS — Z1231 Encounter for screening mammogram for malignant neoplasm of breast: Secondary | ICD-10-CM

## 2014-09-29 ENCOUNTER — Other Ambulatory Visit: Payer: Self-pay | Admitting: Endocrinology

## 2014-09-29 DIAGNOSIS — E049 Nontoxic goiter, unspecified: Secondary | ICD-10-CM

## 2014-10-06 ENCOUNTER — Ambulatory Visit
Admission: RE | Admit: 2014-10-06 | Discharge: 2014-10-06 | Disposition: A | Discharge status: Home / Self Care | Payer: Medicare Other | Source: Ambulatory Visit | Attending: Endocrinology | Admitting: Endocrinology

## 2014-10-06 DIAGNOSIS — E049 Nontoxic goiter, unspecified: Secondary | ICD-10-CM

## 2014-10-11 ENCOUNTER — Other Ambulatory Visit: Payer: Self-pay | Admitting: Endocrinology

## 2014-10-11 DIAGNOSIS — E041 Nontoxic single thyroid nodule: Secondary | ICD-10-CM

## 2014-11-02 ENCOUNTER — Other Ambulatory Visit (HOSPITAL_COMMUNITY)
Admission: RE | Admit: 2014-11-02 | Discharge: 2014-11-02 | Disposition: A | Discharge status: Home / Self Care | Payer: Medicare Other | Source: Ambulatory Visit | Attending: Endocrinology | Admitting: Endocrinology

## 2014-11-02 ENCOUNTER — Ambulatory Visit
Admission: RE | Admit: 2014-11-02 | Discharge: 2014-11-02 | Disposition: A | Discharge status: Home / Self Care | Payer: Medicare Other | Source: Ambulatory Visit | Attending: Endocrinology | Admitting: Endocrinology

## 2014-11-02 DIAGNOSIS — E041 Nontoxic single thyroid nodule: Secondary | ICD-10-CM

## 2014-11-02 NOTE — Procedures (Signed)
   US guided Rt thyroid nodule biopsy 4  25g samples  Pt tolerated well Path pending

## 2014-12-30 ENCOUNTER — Encounter: Payer: Self-pay | Admitting: Obstetrics & Gynecology

## 2014-12-30 ENCOUNTER — Ambulatory Visit (INDEPENDENT_AMBULATORY_CARE_PROVIDER_SITE_OTHER): Payer: Medicare Other | Admitting: Obstetrics & Gynecology

## 2014-12-30 VITALS — BP 136/80 | HR 64 | Resp 16 | Ht 65.5 in | Wt 214.0 lb

## 2014-12-30 DIAGNOSIS — E785 Hyperlipidemia, unspecified: Secondary | ICD-10-CM | POA: Diagnosis not present

## 2014-12-30 DIAGNOSIS — N871 Moderate cervical dysplasia: Secondary | ICD-10-CM

## 2014-12-30 DIAGNOSIS — N3000 Acute cystitis without hematuria: Secondary | ICD-10-CM

## 2014-12-30 DIAGNOSIS — Z01419 Encounter for gynecological examination (general) (routine) without abnormal findings: Secondary | ICD-10-CM | POA: Diagnosis not present

## 2014-12-30 DIAGNOSIS — Z87410 Personal history of cervical dysplasia: Secondary | ICD-10-CM

## 2014-12-30 DIAGNOSIS — B372 Candidiasis of skin and nail: Secondary | ICD-10-CM | POA: Diagnosis not present

## 2014-12-30 DIAGNOSIS — Z0000 Encounter for general adult medical examination without abnormal findings: Secondary | ICD-10-CM | POA: Diagnosis not present

## 2014-12-30 DIAGNOSIS — Z124 Encounter for screening for malignant neoplasm of cervix: Secondary | ICD-10-CM | POA: Diagnosis not present

## 2014-12-30 LAB — POCT URINALYSIS DIPSTICK
Bilirubin, UA: NEGATIVE
Blood, UA: NEGATIVE
Glucose, UA: NEGATIVE
Ketones, UA: NEGATIVE
Nitrite, UA: POSITIVE
Protein, UA: NEGATIVE
Urobilinogen, UA: NEGATIVE
pH, UA: 5

## 2014-12-30 MED ORDER — SULFAMETHOXAZOLE-TRIMETHOPRIM 800-160 MG PO TABS: 1.0000 | ORAL_TABLET | Freq: Two times a day (BID) | ORAL | Status: DC

## 2014-12-30 MED ORDER — NYSTATIN 100000 UNIT/GM EX CREA: 1.0000 "application " | TOPICAL_CREAM | Freq: Two times a day (BID) | CUTANEOUS | Status: DC

## 2014-12-30 NOTE — Addendum Note (Signed)
Addended by: Megan Salon on: 12/30/2014 03:15 PM   Modules accepted: Orders, SmartSet

## 2014-12-30 NOTE — Patient Instructions (Signed)
replens vaginal moisturizer twice weekly.

## 2014-12-30 NOTE — Progress Notes (Addendum)
69 y.o. [name] MarriedCaucasianF here for annual exam.  Doing well.  Wonders if she has a UTI today.  Has been feeling "uncomfortable for several days".  Denies hematuria, back pain, fevers.  Complains today of vaginal dryness.  Would like some recommendations.    PCP:  Dr. Alyson Parks.  Had blood work done last week.  Doesn't know results.  Has done the pneumovax, zostavax vaccines.  Also got her flu shot last week.    Patient's last menstrual period was 03/05/2000.          Sexually active: No.  The current method of family planning is none.    Exercising: Yes.    weights and bike/treadmill/rower Smoker:  Former smoker-years ago  Health Maintenance: Pap:  12/25/12 WNL History of abnormal Pap:  Yes h/o HGSIL, CIN II-s/p LEEP 2010 MMG:  04/15/14 BiRads 1-negative Colonoscopy:  10/05/05-repeat in 10 years BMD:   12/28/14 TDaP:  9/10 Screening Labs: PCP, Hb today: PCP, Urine today: WBC-+3, NITRITES-positive   reports that she has quit smoking. She has never used smokeless tobacco. She reports that she drinks about 0.6 oz of alcohol per week. She reports that she does not use illicit drugs.  Past Medical History  Diagnosis Date  . Hypothyroidism        . Hypertension   . Depression   . Fibromyalgia   . Anxiety   . GERD (gastroesophageal reflux disease)   . Spinal stenosis   . Hyperlipidemia   . Low kidney function     followed by Dr Patricia Parks    Past Surgical History  Procedure Laterality Date  . Cervical biopsy  w/ loop electrode excision  12/2008    hx CIN II    Current Outpatient Prescriptions  Medication Sig Dispense Refill  . aspirin EC 81 MG EC tablet Take 1 tablet (81 mg total) by mouth daily. 30 tablet 1  . buPROPion (WELLBUTRIN XL) 300 MG 24 hr tablet Take 300 mg by mouth every morning.  4  . clonazePAM (KLONOPIN) 1 MG tablet Take 1 mg by mouth as needed for anxiety.    . DULoxetine (CYMBALTA) 30 MG capsule Take 90 mg by mouth daily.     Marland Kitchen gabapentin (NEURONTIN) 300 MG  capsule TAKE ONE CAPSULE BY MOUTH TWICE A DAY FOR TREATMENT OF FIBROMYALGIA  12  . gemfibrozil (LOPID) 600 MG tablet Take 1 tablet (600 mg total) by mouth 2 (two) times daily before a meal. 60 tablet 0  . levothyroxine (SYNTHROID, LEVOTHROID) 75 MCG tablet Take 150 mcg by mouth daily after breakfast.     . metoprolol succinate (TOPROL-XL) 50 MG 24 hr tablet Take 50 mg by mouth daily. Take with or immediately following a meal.    . OVER THE COUNTER MEDICATION Take 50 mg by mouth 2 (two) times daily. Sockeye Salmon Oil Tablets    . traZODone (DESYREL) 50 MG tablet Take 50 mg by mouth at bedtime.    . triamterene-hydrochlorothiazide (DYAZIDE) 37.5-25 MG per capsule Take 1 capsule by mouth every morning.     No current facility-administered medications for this visit.    Family History  Problem Relation Age of Onset  . Osteoporosis Mother   . Hypertension Mother   . Uterine cancer Mother   . Breast cancer Sister   . Osteoporosis Sister   . Diabetes Father   . Heart disease Mother     Atrial fibrillation    ROS:  Pertinent items are noted in HPI.  Otherwise, a comprehensive  ROS was negative.  Exam:   General appearance: alert, cooperative and appears stated age Head: Normocephalic, without obvious abnormality, atraumatic Neck: no adenopathy, supple, symmetrical, trachea midline and thyroid normal to inspection and palpation Lungs: clear to auscultation bilaterally Breasts: normal appearance, no masses or tenderness Heart: regular rate and rhythm Abdomen: soft, non-tender; bowel sounds normal; no masses,  no organomegaly Extremities: extremities normal, atraumatic, no cyanosis or edema Skin: Skin color, texture, turgor normal. No rashes or lesions Lymph nodes: Cervical, supraclavicular, and axillary nodes normal. No abnormal inguinal nodes palpated Neurologic: Grossly normal   Pelvic: External genitalia:  no lesions              Urethra:  normal appearing urethra with no masses,  tenderness or lesions              Bartholins and Skenes: normal                 Vagina: normal appearing vagina with normal color and discharge, no lesions              Cervix: no lesions              Pap taken: Yes.   Bimanual Exam:  Uterus:  normal size, contour, position, consistency, mobility, non-tender              Adnexa: normal adnexa and no mass, fullness, tenderness               Rectovaginal: Confirms               Anus:  normal sphincter tone, no lesions  Chaperone was present for exam.  A:  Well Woman with normal exam H/o CIN 2, LEEP 2010.  Pt had neg pap with neg HR HPV 2013, neg pap 2014.  Pt was not seen last year as she cancelled appt due to insurance change Hypertension Elevated lipids UTI Skin candida beneath breasts  P: Mammogram yearly.  pap smear with HR HPV today. H/O CIN 2.  S/p LEEP 2010. Sees Dr. Alyson Parks. Labs with him and recent BMD done.  Release will be signed for these. Bactrim DS bid x 5 days.  Urine culture pending. Nystatin cream bid for up to 7 days for skin changes. return annually or prn.  (pt asks if she can be seen every other year due to medicare.  I do not fee comfortable with this so recommend yearly examinations.)

## 2015-01-01 LAB — URINE CULTURE: Colony Count: >=100000

## 2015-01-04 LAB — IPS PAP TEST WITH HPV

## 2015-01-31 ENCOUNTER — Telehealth: Payer: Self-pay | Admitting: *Deleted

## 2015-01-31 NOTE — Telephone Encounter (Signed)
Notes Recorded by Elroy Channel, CMA on 01/31/2015 at 11:28 AM LM for pt to call back. Notes Recorded by Megan Salon, MD on 01/07/2015 at 9:58 AM Inform pt her pap and HR HPV are negative. I would recommend yearly physical exams due to her history of moderate dysplasia of the cervix. 08 recall for next year.

## 2015-02-02 NOTE — Telephone Encounter (Signed)
Notes Recorded by Susy Manor, CMA on 02/01/2015 at 4:39 PM Patient notified of normal results as written by provider. aex was already scheduled for 2017 & 08 recall in.

## 2015-03-08 DIAGNOSIS — H34832 Tributary (branch) retinal vein occlusion, left eye, with macular edema: Secondary | ICD-10-CM | POA: Diagnosis not present

## 2015-03-23 DIAGNOSIS — R69 Illness, unspecified: Secondary | ICD-10-CM | POA: Diagnosis not present

## 2015-03-23 DIAGNOSIS — Z1389 Encounter for screening for other disorder: Secondary | ICD-10-CM | POA: Diagnosis not present

## 2015-03-23 DIAGNOSIS — H6592 Unspecified nonsuppurative otitis media, left ear: Secondary | ICD-10-CM | POA: Diagnosis not present

## 2015-03-23 DIAGNOSIS — R400 Somnolence: Secondary | ICD-10-CM | POA: Diagnosis not present

## 2015-03-23 DIAGNOSIS — R5383 Other fatigue: Secondary | ICD-10-CM | POA: Diagnosis not present

## 2015-03-30 ENCOUNTER — Other Ambulatory Visit: Payer: Self-pay

## 2015-03-30 DIAGNOSIS — Z1231 Encounter for screening mammogram for malignant neoplasm of breast: Secondary | ICD-10-CM

## 2015-04-27 ENCOUNTER — Ambulatory Visit
Admission: RE | Admit: 2015-04-27 | Discharge: 2015-04-27 | Disposition: A | Discharge status: Home / Self Care | Payer: Medicare HMO | Source: Ambulatory Visit

## 2015-04-27 DIAGNOSIS — Z1231 Encounter for screening mammogram for malignant neoplasm of breast: Secondary | ICD-10-CM | POA: Diagnosis not present

## 2015-05-03 DIAGNOSIS — H34832 Tributary (branch) retinal vein occlusion, left eye, with macular edema: Secondary | ICD-10-CM | POA: Diagnosis not present

## 2015-05-17 DIAGNOSIS — E039 Hypothyroidism, unspecified: Secondary | ICD-10-CM | POA: Diagnosis not present

## 2015-05-17 DIAGNOSIS — I10 Essential (primary) hypertension: Secondary | ICD-10-CM | POA: Diagnosis not present

## 2015-05-17 DIAGNOSIS — E559 Vitamin D deficiency, unspecified: Secondary | ICD-10-CM | POA: Diagnosis not present

## 2015-05-17 DIAGNOSIS — E049 Nontoxic goiter, unspecified: Secondary | ICD-10-CM | POA: Diagnosis not present

## 2015-06-14 DIAGNOSIS — E7800 Pure hypercholesterolemia, unspecified: Secondary | ICD-10-CM | POA: Diagnosis not present

## 2015-06-14 DIAGNOSIS — I129 Hypertensive chronic kidney disease with stage 1 through stage 4 chronic kidney disease, or unspecified chronic kidney disease: Secondary | ICD-10-CM | POA: Diagnosis not present

## 2015-06-14 DIAGNOSIS — M797 Fibromyalgia: Secondary | ICD-10-CM | POA: Diagnosis not present

## 2015-06-14 DIAGNOSIS — R69 Illness, unspecified: Secondary | ICD-10-CM | POA: Diagnosis not present

## 2015-06-14 DIAGNOSIS — N183 Chronic kidney disease, stage 3 (moderate): Secondary | ICD-10-CM | POA: Diagnosis not present

## 2015-06-28 DIAGNOSIS — H34832 Tributary (branch) retinal vein occlusion, left eye, with macular edema: Secondary | ICD-10-CM | POA: Diagnosis not present

## 2015-07-11 DIAGNOSIS — R69 Illness, unspecified: Secondary | ICD-10-CM | POA: Diagnosis not present

## 2015-07-26 DIAGNOSIS — R7309 Other abnormal glucose: Secondary | ICD-10-CM | POA: Diagnosis not present

## 2015-07-27 ENCOUNTER — Ambulatory Visit (HOSPITAL_COMMUNITY): Payer: Medicare HMO | Admitting: Psychiatry

## 2015-08-02 DIAGNOSIS — R69 Illness, unspecified: Secondary | ICD-10-CM | POA: Diagnosis not present

## 2015-08-08 DIAGNOSIS — R69 Illness, unspecified: Secondary | ICD-10-CM | POA: Diagnosis not present

## 2015-08-16 DIAGNOSIS — H34832 Tributary (branch) retinal vein occlusion, left eye, with macular edema: Secondary | ICD-10-CM | POA: Diagnosis not present

## 2015-08-26 DIAGNOSIS — H25813 Combined forms of age-related cataract, bilateral: Secondary | ICD-10-CM | POA: Diagnosis not present

## 2015-08-26 DIAGNOSIS — H1132 Conjunctival hemorrhage, left eye: Secondary | ICD-10-CM | POA: Diagnosis not present

## 2015-09-21 DIAGNOSIS — E039 Hypothyroidism, unspecified: Secondary | ICD-10-CM | POA: Diagnosis not present

## 2015-09-21 DIAGNOSIS — E559 Vitamin D deficiency, unspecified: Secondary | ICD-10-CM | POA: Diagnosis not present

## 2015-09-21 DIAGNOSIS — R7301 Impaired fasting glucose: Secondary | ICD-10-CM | POA: Diagnosis not present

## 2015-09-28 DIAGNOSIS — I10 Essential (primary) hypertension: Secondary | ICD-10-CM | POA: Diagnosis not present

## 2015-09-28 DIAGNOSIS — E039 Hypothyroidism, unspecified: Secondary | ICD-10-CM | POA: Diagnosis not present

## 2015-09-28 DIAGNOSIS — E049 Nontoxic goiter, unspecified: Secondary | ICD-10-CM | POA: Diagnosis not present

## 2015-09-28 DIAGNOSIS — R7301 Impaired fasting glucose: Secondary | ICD-10-CM | POA: Diagnosis not present

## 2015-09-30 DIAGNOSIS — H524 Presbyopia: Secondary | ICD-10-CM | POA: Diagnosis not present

## 2015-09-30 DIAGNOSIS — H25013 Cortical age-related cataract, bilateral: Secondary | ICD-10-CM | POA: Diagnosis not present

## 2015-09-30 DIAGNOSIS — H2513 Age-related nuclear cataract, bilateral: Secondary | ICD-10-CM | POA: Diagnosis not present

## 2015-09-30 DIAGNOSIS — H25043 Posterior subcapsular polar age-related cataract, bilateral: Secondary | ICD-10-CM | POA: Diagnosis not present

## 2015-10-04 DIAGNOSIS — H34832 Tributary (branch) retinal vein occlusion, left eye, with macular edema: Secondary | ICD-10-CM | POA: Diagnosis not present

## 2015-10-25 DIAGNOSIS — H25041 Posterior subcapsular polar age-related cataract, right eye: Secondary | ICD-10-CM | POA: Diagnosis not present

## 2015-10-25 DIAGNOSIS — H52201 Unspecified astigmatism, right eye: Secondary | ICD-10-CM | POA: Diagnosis not present

## 2015-10-25 DIAGNOSIS — H25811 Combined forms of age-related cataract, right eye: Secondary | ICD-10-CM | POA: Diagnosis not present

## 2015-10-25 DIAGNOSIS — H2511 Age-related nuclear cataract, right eye: Secondary | ICD-10-CM | POA: Diagnosis not present

## 2015-10-25 DIAGNOSIS — H25011 Cortical age-related cataract, right eye: Secondary | ICD-10-CM | POA: Diagnosis not present

## 2015-10-25 HISTORY — PX: CATARACT EXTRACTION W/ INTRAOCULAR LENS IMPLANT: SHX1309

## 2015-11-22 DIAGNOSIS — Z1211 Encounter for screening for malignant neoplasm of colon: Secondary | ICD-10-CM | POA: Diagnosis not present

## 2015-11-22 DIAGNOSIS — R1310 Dysphagia, unspecified: Secondary | ICD-10-CM | POA: Diagnosis not present

## 2015-11-22 DIAGNOSIS — E669 Obesity, unspecified: Secondary | ICD-10-CM | POA: Diagnosis not present

## 2015-11-23 ENCOUNTER — Other Ambulatory Visit: Payer: Self-pay | Admitting: Gastroenterology

## 2015-11-23 DIAGNOSIS — R1310 Dysphagia, unspecified: Secondary | ICD-10-CM

## 2015-11-29 DIAGNOSIS — R69 Illness, unspecified: Secondary | ICD-10-CM | POA: Diagnosis not present

## 2015-11-29 DIAGNOSIS — H34832 Tributary (branch) retinal vein occlusion, left eye, with macular edema: Secondary | ICD-10-CM | POA: Diagnosis not present

## 2015-12-05 ENCOUNTER — Other Ambulatory Visit: Payer: Medicare HMO

## 2015-12-05 DIAGNOSIS — L718 Other rosacea: Secondary | ICD-10-CM | POA: Diagnosis not present

## 2015-12-06 DIAGNOSIS — H2512 Age-related nuclear cataract, left eye: Secondary | ICD-10-CM | POA: Diagnosis not present

## 2015-12-06 DIAGNOSIS — H25812 Combined forms of age-related cataract, left eye: Secondary | ICD-10-CM | POA: Diagnosis not present

## 2015-12-15 ENCOUNTER — Ambulatory Visit
Admission: RE | Admit: 2015-12-15 | Discharge: 2015-12-15 | Disposition: A | Discharge status: Home / Self Care | Payer: Medicare HMO | Source: Ambulatory Visit | Attending: Gastroenterology | Admitting: Gastroenterology

## 2015-12-15 DIAGNOSIS — R1310 Dysphagia, unspecified: Secondary | ICD-10-CM

## 2015-12-28 DIAGNOSIS — Z1211 Encounter for screening for malignant neoplasm of colon: Secondary | ICD-10-CM | POA: Diagnosis not present

## 2016-01-02 ENCOUNTER — Ambulatory Visit (INDEPENDENT_AMBULATORY_CARE_PROVIDER_SITE_OTHER): Payer: Medicare HMO | Admitting: Obstetrics & Gynecology

## 2016-01-02 ENCOUNTER — Encounter: Payer: Self-pay | Admitting: Obstetrics & Gynecology

## 2016-01-02 VITALS — BP 128/62 | HR 58 | Resp 16 | Ht 65.0 in | Wt 207.2 lb

## 2016-01-02 DIAGNOSIS — Z01419 Encounter for gynecological examination (general) (routine) without abnormal findings: Secondary | ICD-10-CM | POA: Diagnosis not present

## 2016-01-02 DIAGNOSIS — Z205 Contact with and (suspected) exposure to viral hepatitis: Secondary | ICD-10-CM | POA: Diagnosis not present

## 2016-01-02 DIAGNOSIS — E2839 Other primary ovarian failure: Secondary | ICD-10-CM

## 2016-01-02 NOTE — Patient Instructions (Addendum)
Please plan to do a bone density with your next mammogram around 2/18.    Betsy [place]ique at [facility]

## 2016-01-02 NOTE — Progress Notes (Signed)
70 y.o. [id number] MarriedCaucasianF here for annual exam.  Doing well.  Denies vaginal bleeding.     PCP     Patient's last menstrual period was 03/05/2000.          Sexually active: No.  The current method of family planning is post menopausal status.    Exercising: Yes.    exercise machines Smoker:  Former smoker  Health Maintenance: Pap:  12/30/14 negative, HR HPV negative  History of abnormal Pap:  yes MMG:  04/27/15 BIRADS 1 negative  Colonoscopy:  12/28/15 with Dr. Collene Mares.  Clear per pt.  Follow up 10 years. BMD:   12/29/12 spine stable, hips with osteopenia- repeat ~3 years   TDaP:  2010  Pneumonia vaccine(s):  PCP Zostavax:   PCP Hep C testing: draw at end of appointment Screening Labs: PCP, Hb today: PCP, Urine today: PCP   reports that she has quit smoking. She has never used smokeless tobacco. She reports that she drinks about 0.6 oz of alcohol per week . She reports that she does not use drugs.  Past Medical History:  Diagnosis Date  . Anxiety   . Depression   . Fibromyalgia   . GERD (gastroesophageal reflux disease)   . Hyperlipidemia   . Hypertension   . Hypothyroidism       . Low kidney function    followed by Dr Alyson Ingles  . Spinal stenosis     Past Surgical History:  Procedure Laterality Date  . CERVICAL BIOPSY  W/ LOOP ELECTRODE EXCISION  12/2008   hx CIN II    Current Outpatient Prescriptions  Medication Sig Dispense Refill  . aspirin EC 81 MG EC tablet Take 1 tablet (81 mg total) by mouth daily. 30 tablet 1  . FLUoxetine (PROZAC) 20 MG capsule TK 1 C PO QD  0  . gabapentin (NEURONTIN) 300 MG capsule TAKE ONE CAPSULE BY MOUTH TWICE A DAY FOR TREATMENT OF FIBROMYALGIA  12  . gemfibrozil (LOPID) 600 MG tablet Take 1 tablet (600 mg total) by mouth 2 (two) times daily before a meal. 60 tablet 0  . levothyroxine (SYNTHROID, LEVOTHROID) 75 MCG tablet Take 150 mcg by mouth daily after breakfast.     . LYRICA 100 MG capsule TK 1 C PO BID  0  . metoprolol  succinate (TOPROL-XL) 50 MG 24 hr tablet Take 50 mg by mouth daily. Take with or immediately following a meal.    . metroNIDAZOLE (METROGEL) 0.75 % gel   2  . nystatin cream (MYCOSTATIN) Apply 1 application topically 2 (two) times daily. Apply to affected area BID for up to 7 days. 30 g 1  . OVER THE COUNTER MEDICATION Take 50 mg by mouth 2 (two) times daily. Sockeye Salmon Oil Tablets    . raloxifene (EVISTA) 60 MG tablet TK 1 T PO QD  8  . traZODone (DESYREL) 50 MG tablet Take 50 mg by mouth at bedtime.    . triamterene-hydrochlorothiazide (DYAZIDE) 37.5-25 MG per capsule Take 1 capsule by mouth every morning.     No current facility-administered medications for this visit.     Family History  Problem Relation Age of Onset  . Osteoporosis Mother   . Hypertension Mother   . Uterine cancer Mother   . Heart disease Mother     Atrial fibrillation  . Breast cancer Sister   . Osteoporosis Sister   . Diabetes Father     ROS:  Pertinent items are noted in HPI.  Otherwise, a  comprehensive ROS was negative.  Exam:   BP 128/62 (BP Location: Right Arm, Patient Position: Sitting, Cuff Size: Large)   Pulse (!) 58   Resp 16   Ht 5\' 5"  (1.651 m)   Wt 207 lb 3.2 oz (94 kg)   LMP 03/05/2000   BMI 34.48 kg/m   Weight change: -7#   Height: 5\' 5"  (165.1 cm)  Ht Readings from Last 3 Encounters:  01/02/16 5\' 5"  (1.651 m)  12/30/14 5' 5.5" (1.664 m)  09/10/13 5\' 6"  (1.676 m)   General appearance: alert, cooperative and appears stated age Head: Normocephalic, without obvious abnormality, atraumatic Neck: no adenopathy, supple, symmetrical, trachea midline and thyroid normal to inspection and palpation Lungs: clear to auscultation bilaterally Breasts: normal appearance, no masses or tenderness Heart: regular rate and rhythm Abdomen: soft, non-tender; bowel sounds normal; no masses,  no organomegaly Extremities: extremities normal, atraumatic, no cyanosis or edema Skin: Skin color, texture,  turgor normal. No rashes or lesions Lymph nodes: Cervical, supraclavicular, and axillary nodes normal. No abnormal inguinal nodes palpated Neurologic: Grossly normal   Pelvic: External genitalia:  no lesions              Urethra:  normal appearing urethra with no masses, tenderness or lesions              Bartholins and Skenes: normal                 Vagina: normal appearing vagina with normal color and discharge, no lesions              Cervix: no cervical motion tenderness              Pap taken: No. Bimanual Exam:  Uterus:  normal size, contour, position, consistency, mobility, non-tender              Adnexa: normal adnexa and no mass, fullness, tenderness               Rectovaginal: Confirms               Anus:  normal sphincter tone, no lesions  Chaperone was present for exam.  A:  Well Woman with normal exam H/o CIN 2, LEEP 2010.  Pt had neg pap with neg HR HPV 2013, neg pap 2014, neg pap with neg HR HPV 2016 Hypertension Elevated lipids Hypothyroidism and h/o nodule.  Followed by Dr. Chalmers Cater.  P: Mammogram yearly.  Plan BMD with MMG 2/18.  Order placed.  pap smear with HR HPV 2016. H/O CIN 2.  S/p LEEP 2010.  No pap smear today. Lab work and vaccines with PCP Hep C antibody today AEX 1 year or follow up prn.

## 2016-01-03 LAB — HEPATITIS C ANTIBODY: HCV Ab: NEGATIVE

## 2016-01-17 DIAGNOSIS — H34832 Tributary (branch) retinal vein occlusion, left eye, with macular edema: Secondary | ICD-10-CM | POA: Diagnosis not present

## 2016-02-06 ENCOUNTER — Telehealth: Payer: Self-pay | Admitting: *Deleted

## 2016-02-06 DIAGNOSIS — R69 Illness, unspecified: Secondary | ICD-10-CM | POA: Diagnosis not present

## 2016-02-06 NOTE — Telephone Encounter (Signed)
Patient in 08 recall for 01/2016. Patient seen  01/02/16 for AEX. No PAP collected. Please advise of recall status -eh

## 2016-02-14 DIAGNOSIS — R69 Illness, unspecified: Secondary | ICD-10-CM | POA: Diagnosis not present

## 2016-02-20 NOTE — Telephone Encounter (Signed)
She does not need to be in pap recall.  Ok to remove.

## 2016-02-21 DIAGNOSIS — R69 Illness, unspecified: Secondary | ICD-10-CM | POA: Diagnosis not present

## 2016-02-21 DIAGNOSIS — H34832 Tributary (branch) retinal vein occlusion, left eye, with macular edema: Secondary | ICD-10-CM | POA: Diagnosis not present

## 2016-02-21 NOTE — Telephone Encounter (Signed)
Removed from recall -eh 

## 2016-03-12 DIAGNOSIS — R69 Illness, unspecified: Secondary | ICD-10-CM | POA: Diagnosis not present

## 2016-03-20 DIAGNOSIS — H34832 Tributary (branch) retinal vein occlusion, left eye, with macular edema: Secondary | ICD-10-CM | POA: Diagnosis not present

## 2016-04-02 DIAGNOSIS — L308 Other specified dermatitis: Secondary | ICD-10-CM | POA: Diagnosis not present

## 2016-04-02 DIAGNOSIS — L918 Other hypertrophic disorders of the skin: Secondary | ICD-10-CM | POA: Diagnosis not present

## 2016-04-12 ENCOUNTER — Other Ambulatory Visit: Payer: Self-pay | Admitting: Obstetrics & Gynecology

## 2016-04-12 DIAGNOSIS — Z1231 Encounter for screening mammogram for malignant neoplasm of breast: Secondary | ICD-10-CM

## 2016-04-17 DIAGNOSIS — H34832 Tributary (branch) retinal vein occlusion, left eye, with macular edema: Secondary | ICD-10-CM | POA: Diagnosis not present

## 2016-04-23 DIAGNOSIS — R69 Illness, unspecified: Secondary | ICD-10-CM | POA: Diagnosis not present

## 2016-04-27 ENCOUNTER — Ambulatory Visit
Admission: RE | Admit: 2016-04-27 | Discharge: 2016-04-27 | Disposition: A | Discharge status: Home / Self Care | Payer: Medicare HMO | Source: Ambulatory Visit | Attending: Obstetrics & Gynecology | Admitting: Obstetrics & Gynecology

## 2016-04-27 DIAGNOSIS — Z780 Asymptomatic menopausal state: Secondary | ICD-10-CM | POA: Diagnosis not present

## 2016-04-27 DIAGNOSIS — M85851 Other specified disorders of bone density and structure, right thigh: Secondary | ICD-10-CM | POA: Diagnosis not present

## 2016-04-27 DIAGNOSIS — E2839 Other primary ovarian failure: Secondary | ICD-10-CM

## 2016-04-27 DIAGNOSIS — Z1231 Encounter for screening mammogram for malignant neoplasm of breast: Secondary | ICD-10-CM | POA: Diagnosis not present

## 2016-04-30 ENCOUNTER — Telehealth: Payer: Self-pay | Admitting: *Deleted

## 2016-04-30 ENCOUNTER — Other Ambulatory Visit: Payer: Self-pay | Admitting: Obstetrics and Gynecology

## 2016-04-30 MED ORDER — RALOXIFENE HCL 60 MG PO TABS: ORAL_TABLET | ORAL | 7 refills | Status: DC

## 2016-04-30 NOTE — Telephone Encounter (Signed)
Prescription for Evista 60 mg daily sent to patient's pharmacy until annual exam is due in October 2018 with Dr. Sabra Heck.  Encounter closed.

## 2016-04-30 NOTE — Telephone Encounter (Signed)
-----   Message from Megan Salon, MD sent at 04/28/2016  9:49 PM EST ----- Please let pt know her BMD showed osteopenia in her hip and normal findings in the spine.  No treatment needed at this time.  Repeat 3-4 years.

## 2016-04-30 NOTE — Telephone Encounter (Signed)
Call to patient. BMD results reviewed with patient and she verbalized understanding. Patient questioning if she should continue taking her Evista because her PCP (Dr. Alyson Ingles) will not refill. RN advised this message would be sent to Dr. Ammie Ferrier covering provider this week. Patient agreeable and aware office will call with additional recommendations.   Routing to provider for review.   Cc Dr. Sabra Heck

## 2016-04-30 NOTE — Telephone Encounter (Signed)
Call to patient. Message given as seen below from Dr. Quincy Simmonds. Patient agreeable. States she uses [facility] on [address] and [facility] as preferred pharmacy.   Routing to provider for review.

## 2016-04-30 NOTE — Telephone Encounter (Signed)
Dr. Sabra Heck has ordered the last couple of bone densities.  Her bone density is difficult to compare as two different machines were used with the last 2 bone density studies. We are happy to take over the prescription for the Evista 60 mg daily until her annual exam in October 2018 with Dr. Sabra Heck.

## 2016-04-30 NOTE — Telephone Encounter (Signed)
Help me to understand why the PCP will not refill the Evista please.

## 2016-04-30 NOTE — Telephone Encounter (Signed)
Message left to return call to Pheng Prokop at [phone].    

## 2016-04-30 NOTE — Telephone Encounter (Signed)
Patient states that PCP was unaware she was having a BMD scan on Friday 04/27/16. She states he has been the one prescribing and doing refills. Should she contact his office? Please advise.   Routing to provider for review.

## 2016-05-07 DIAGNOSIS — R69 Illness, unspecified: Secondary | ICD-10-CM | POA: Diagnosis not present

## 2016-05-17 DIAGNOSIS — H34832 Tributary (branch) retinal vein occlusion, left eye, with macular edema: Secondary | ICD-10-CM | POA: Diagnosis not present

## 2016-05-21 DIAGNOSIS — R69 Illness, unspecified: Secondary | ICD-10-CM | POA: Diagnosis not present

## 2016-06-04 DIAGNOSIS — R69 Illness, unspecified: Secondary | ICD-10-CM | POA: Diagnosis not present

## 2016-06-18 DIAGNOSIS — R69 Illness, unspecified: Secondary | ICD-10-CM | POA: Diagnosis not present

## 2016-06-21 DIAGNOSIS — H34832 Tributary (branch) retinal vein occlusion, left eye, with macular edema: Secondary | ICD-10-CM | POA: Diagnosis not present

## 2016-07-02 DIAGNOSIS — R69 Illness, unspecified: Secondary | ICD-10-CM | POA: Diagnosis not present

## 2016-07-06 ENCOUNTER — Telehealth: Payer: Self-pay | Admitting: Obstetrics & Gynecology

## 2016-07-06 NOTE — Telephone Encounter (Signed)
Evista's risk is for formation of a clot which I am not sure she has.  However, there is no issue with switching medications.  I think she needs to bring in the information about whatever is going on with her eye and let's discuss options for her.

## 2016-07-06 NOTE — Telephone Encounter (Signed)
Spoke with patient. Advised of message as seen below from [place]. Patient is agreeable and verbalizes understanding. Will obtain information from her eye doctor and return call to schedule an appointment with [place].  Routing to provider for final review. Patient agreeable to disposition. Will close encounter.

## 2016-07-06 NOTE — Telephone Encounter (Signed)
Left message to call India Jolin at [phone]. 

## 2016-07-06 NOTE — Telephone Encounter (Signed)
Patient has some questions about a medication she is taking and want to speak to a nurse.

## 2016-07-06 NOTE — Telephone Encounter (Signed)
Spoke with patient. Patient states she is taking Evista 60 mg daily. Reports she has an occulusion in her eye that she is receiving injections for. Reports even with injections she is still having "blood" in her eye and is unsure why the shots are not working well. Reports she has been doing research and would like to know if Dr.Miller feels stopping Evista could be beneficial or switching to an alternative. Advised will review with MD and return call with further information. Patient is agreeable. Next injection scheduled for 07/26/2016.

## 2016-07-16 DIAGNOSIS — R69 Illness, unspecified: Secondary | ICD-10-CM | POA: Diagnosis not present

## 2016-07-18 ENCOUNTER — Other Ambulatory Visit: Payer: Self-pay | Admitting: Endocrinology

## 2016-07-18 DIAGNOSIS — E049 Nontoxic goiter, unspecified: Secondary | ICD-10-CM

## 2016-07-26 DIAGNOSIS — H34832 Tributary (branch) retinal vein occlusion, left eye, with macular edema: Secondary | ICD-10-CM | POA: Diagnosis not present

## 2016-07-27 DIAGNOSIS — S30861A Insect bite (nonvenomous) of abdominal wall, initial encounter: Secondary | ICD-10-CM | POA: Diagnosis not present

## 2016-07-27 DIAGNOSIS — L255 Unspecified contact dermatitis due to plants, except food: Secondary | ICD-10-CM | POA: Diagnosis not present

## 2016-07-29 DIAGNOSIS — S61411A Laceration without foreign body of right hand, initial encounter: Secondary | ICD-10-CM | POA: Diagnosis not present

## 2016-08-06 DIAGNOSIS — R69 Illness, unspecified: Secondary | ICD-10-CM | POA: Diagnosis not present

## 2016-08-22 DIAGNOSIS — R69 Illness, unspecified: Secondary | ICD-10-CM | POA: Diagnosis not present

## 2016-08-29 DIAGNOSIS — R69 Illness, unspecified: Secondary | ICD-10-CM | POA: Diagnosis not present

## 2016-09-06 DIAGNOSIS — H34832 Tributary (branch) retinal vein occlusion, left eye, with macular edema: Secondary | ICD-10-CM | POA: Diagnosis not present

## 2016-09-10 DIAGNOSIS — R69 Illness, unspecified: Secondary | ICD-10-CM | POA: Diagnosis not present

## 2016-09-19 DIAGNOSIS — H34832 Tributary (branch) retinal vein occlusion, left eye, with macular edema: Secondary | ICD-10-CM | POA: Diagnosis not present

## 2016-09-19 DIAGNOSIS — H43813 Vitreous degeneration, bilateral: Secondary | ICD-10-CM | POA: Diagnosis not present

## 2016-09-20 ENCOUNTER — Ambulatory Visit
Admission: RE | Admit: 2016-09-20 | Discharge: 2016-09-20 | Disposition: A | Discharge status: Home / Self Care | Payer: Medicare HMO | Source: Ambulatory Visit | Attending: Endocrinology | Admitting: Endocrinology

## 2016-09-20 DIAGNOSIS — E042 Nontoxic multinodular goiter: Secondary | ICD-10-CM | POA: Diagnosis not present

## 2016-09-20 DIAGNOSIS — R7301 Impaired fasting glucose: Secondary | ICD-10-CM | POA: Diagnosis not present

## 2016-09-20 DIAGNOSIS — E039 Hypothyroidism, unspecified: Secondary | ICD-10-CM | POA: Diagnosis not present

## 2016-09-20 DIAGNOSIS — E049 Nontoxic goiter, unspecified: Secondary | ICD-10-CM

## 2016-09-20 DIAGNOSIS — E559 Vitamin D deficiency, unspecified: Secondary | ICD-10-CM | POA: Diagnosis not present

## 2016-09-24 DIAGNOSIS — R69 Illness, unspecified: Secondary | ICD-10-CM | POA: Diagnosis not present

## 2016-09-26 DIAGNOSIS — H34832 Tributary (branch) retinal vein occlusion, left eye, with macular edema: Secondary | ICD-10-CM | POA: Diagnosis not present

## 2016-09-27 DIAGNOSIS — E049 Nontoxic goiter, unspecified: Secondary | ICD-10-CM | POA: Diagnosis not present

## 2016-09-27 DIAGNOSIS — E559 Vitamin D deficiency, unspecified: Secondary | ICD-10-CM | POA: Diagnosis not present

## 2016-09-27 DIAGNOSIS — I10 Essential (primary) hypertension: Secondary | ICD-10-CM | POA: Diagnosis not present

## 2016-09-27 DIAGNOSIS — E039 Hypothyroidism, unspecified: Secondary | ICD-10-CM | POA: Diagnosis not present

## 2016-10-05 DIAGNOSIS — M1812 Unilateral primary osteoarthritis of first carpometacarpal joint, left hand: Secondary | ICD-10-CM | POA: Diagnosis not present

## 2016-10-05 DIAGNOSIS — M25532 Pain in left wrist: Secondary | ICD-10-CM | POA: Diagnosis not present

## 2016-10-05 DIAGNOSIS — M65312 Trigger thumb, left thumb: Secondary | ICD-10-CM | POA: Diagnosis not present

## 2016-10-05 DIAGNOSIS — M654 Radial styloid tenosynovitis [de Quervain]: Secondary | ICD-10-CM | POA: Diagnosis not present

## 2016-10-08 DIAGNOSIS — R69 Illness, unspecified: Secondary | ICD-10-CM | POA: Diagnosis not present

## 2016-10-22 DIAGNOSIS — R69 Illness, unspecified: Secondary | ICD-10-CM | POA: Diagnosis not present

## 2016-10-24 DIAGNOSIS — H43813 Vitreous degeneration, bilateral: Secondary | ICD-10-CM | POA: Diagnosis not present

## 2016-10-24 DIAGNOSIS — H34832 Tributary (branch) retinal vein occlusion, left eye, with macular edema: Secondary | ICD-10-CM | POA: Diagnosis not present

## 2016-11-12 DIAGNOSIS — R69 Illness, unspecified: Secondary | ICD-10-CM | POA: Diagnosis not present

## 2016-11-19 DIAGNOSIS — E7800 Pure hypercholesterolemia, unspecified: Secondary | ICD-10-CM | POA: Diagnosis not present

## 2016-11-19 DIAGNOSIS — R69 Illness, unspecified: Secondary | ICD-10-CM | POA: Diagnosis not present

## 2016-11-19 DIAGNOSIS — I129 Hypertensive chronic kidney disease with stage 1 through stage 4 chronic kidney disease, or unspecified chronic kidney disease: Secondary | ICD-10-CM | POA: Diagnosis not present

## 2016-11-19 DIAGNOSIS — E669 Obesity, unspecified: Secondary | ICD-10-CM | POA: Diagnosis not present

## 2016-11-19 DIAGNOSIS — E039 Hypothyroidism, unspecified: Secondary | ICD-10-CM | POA: Diagnosis not present

## 2016-11-19 DIAGNOSIS — N183 Chronic kidney disease, stage 3 (moderate): Secondary | ICD-10-CM | POA: Diagnosis not present

## 2016-11-19 DIAGNOSIS — M797 Fibromyalgia: Secondary | ICD-10-CM | POA: Diagnosis not present

## 2016-11-19 DIAGNOSIS — I10 Essential (primary) hypertension: Secondary | ICD-10-CM | POA: Diagnosis not present

## 2016-11-21 DIAGNOSIS — H34832 Tributary (branch) retinal vein occlusion, left eye, with macular edema: Secondary | ICD-10-CM | POA: Diagnosis not present

## 2016-11-21 DIAGNOSIS — H43813 Vitreous degeneration, bilateral: Secondary | ICD-10-CM | POA: Diagnosis not present

## 2016-11-26 DIAGNOSIS — R69 Illness, unspecified: Secondary | ICD-10-CM | POA: Diagnosis not present

## 2016-11-28 DIAGNOSIS — M722 Plantar fascial fibromatosis: Secondary | ICD-10-CM | POA: Diagnosis not present

## 2016-12-01 DIAGNOSIS — R69 Illness, unspecified: Secondary | ICD-10-CM | POA: Diagnosis not present

## 2016-12-04 ENCOUNTER — Telehealth: Payer: Self-pay | Admitting: Obstetrics & Gynecology

## 2016-12-04 NOTE — Telephone Encounter (Signed)
Patient called to find out when she is due for her nect TDap. Please advise.

## 2016-12-04 NOTE — Telephone Encounter (Signed)
Spoke with patient. Advised per our records last Tdap was given in 2010. Will be due for another Tdap in 2020. Patient verbalizes understanding.  Routing to provider for final review. Patient agreeable to disposition. Will close encounter.

## 2016-12-06 DIAGNOSIS — M6530 Trigger finger, unspecified finger: Secondary | ICD-10-CM | POA: Diagnosis not present

## 2016-12-06 DIAGNOSIS — M5416 Radiculopathy, lumbar region: Secondary | ICD-10-CM | POA: Diagnosis not present

## 2016-12-06 DIAGNOSIS — M19032 Primary osteoarthritis, left wrist: Secondary | ICD-10-CM | POA: Diagnosis not present

## 2016-12-06 DIAGNOSIS — M722 Plantar fascial fibromatosis: Secondary | ICD-10-CM | POA: Diagnosis not present

## 2016-12-10 DIAGNOSIS — R69 Illness, unspecified: Secondary | ICD-10-CM | POA: Diagnosis not present

## 2016-12-19 DIAGNOSIS — H34832 Tributary (branch) retinal vein occlusion, left eye, with macular edema: Secondary | ICD-10-CM | POA: Diagnosis not present

## 2016-12-19 DIAGNOSIS — H43813 Vitreous degeneration, bilateral: Secondary | ICD-10-CM | POA: Diagnosis not present

## 2016-12-23 ENCOUNTER — Other Ambulatory Visit: Payer: Self-pay | Admitting: Obstetrics and Gynecology

## 2016-12-24 DIAGNOSIS — R69 Illness, unspecified: Secondary | ICD-10-CM | POA: Diagnosis not present

## 2016-12-24 NOTE — Telephone Encounter (Signed)
Medication refill request: Evista  Last AEX:  01-02-16  Next AEX: 04-23-17  Last MMG (if hormonal medication request): 04-30-16 WNL Refill authorized: please advise

## 2016-12-26 ENCOUNTER — Other Ambulatory Visit: Payer: Self-pay | Admitting: Obstetrics and Gynecology

## 2016-12-26 NOTE — Telephone Encounter (Signed)
Can you please call this pt.  Her BMD earlier this year just showed osteopenia.  I think she should stop this and have a "drug holiday".  Will repeat the bone density in two or three years and make sure it is stable.  Medication not refilled.  Thanks.

## 2016-12-26 NOTE — Telephone Encounter (Signed)
Left message to call regarding refill request

## 2017-01-01 ENCOUNTER — Ambulatory Visit: Payer: Medicare Other | Admitting: Obstetrics & Gynecology

## 2017-01-10 DIAGNOSIS — H34832 Tributary (branch) retinal vein occlusion, left eye, with macular edema: Secondary | ICD-10-CM | POA: Diagnosis not present

## 2017-01-10 DIAGNOSIS — Z961 Presence of intraocular lens: Secondary | ICD-10-CM | POA: Diagnosis not present

## 2017-01-10 DIAGNOSIS — H5212 Myopia, left eye: Secondary | ICD-10-CM | POA: Diagnosis not present

## 2017-01-14 ENCOUNTER — Other Ambulatory Visit: Payer: Self-pay | Admitting: Obstetrics and Gynecology

## 2017-01-14 NOTE — Telephone Encounter (Signed)
Medication refill request: Evista  Last AEX:  01-02-16  Next AEX: 04-23-17  Last MMG (if hormonal medication request): 04-27-16 WNL  Refill authorized: please advise

## 2017-01-21 DIAGNOSIS — R69 Illness, unspecified: Secondary | ICD-10-CM | POA: Diagnosis not present

## 2017-01-30 DIAGNOSIS — H34832 Tributary (branch) retinal vein occlusion, left eye, with macular edema: Secondary | ICD-10-CM | POA: Diagnosis not present

## 2017-01-30 DIAGNOSIS — H43813 Vitreous degeneration, bilateral: Secondary | ICD-10-CM | POA: Diagnosis not present

## 2017-02-04 DIAGNOSIS — R69 Illness, unspecified: Secondary | ICD-10-CM | POA: Diagnosis not present

## 2017-02-11 ENCOUNTER — Ambulatory Visit: Payer: Medicare HMO | Admitting: Podiatry

## 2017-02-13 ENCOUNTER — Ambulatory Visit: Payer: Medicare HMO | Admitting: Podiatry

## 2017-02-18 DIAGNOSIS — R69 Illness, unspecified: Secondary | ICD-10-CM | POA: Diagnosis not present

## 2017-02-20 ENCOUNTER — Encounter: Payer: Self-pay | Admitting: Podiatry

## 2017-02-20 ENCOUNTER — Ambulatory Visit: Payer: Medicare HMO | Admitting: Podiatry

## 2017-02-20 ENCOUNTER — Ambulatory Visit (INDEPENDENT_AMBULATORY_CARE_PROVIDER_SITE_OTHER): Payer: Medicare HMO

## 2017-02-20 VITALS — BP 131/81 | HR 79

## 2017-02-20 DIAGNOSIS — M79671 Pain in right foot: Secondary | ICD-10-CM

## 2017-02-20 DIAGNOSIS — M7661 Achilles tendinitis, right leg: Secondary | ICD-10-CM

## 2017-02-20 MED ORDER — MELOXICAM 15 MG PO TABS
15.0000 mg | ORAL_TABLET | Freq: Every day | ORAL | 1 refills | Status: AC
Start: 2017-02-20 — End: 2017-03-22

## 2017-02-20 MED ORDER — METHYLPREDNISOLONE 4 MG PO TBPK: ORAL_TABLET | ORAL | 0 refills | Status: DC

## 2017-02-21 ENCOUNTER — Telehealth: Payer: Self-pay | Admitting: Podiatry

## 2017-02-21 NOTE — Telephone Encounter (Signed)
I saw Dr. Amalia Hailey yesterday and he prescribed prednisone and meloxicam for me. I know I'm supposed to be taking the prednisone but should I start the meloxicam also. My phone number is [phone] and you can just leave a message on my voicemail. Thank you. Bye.

## 2017-02-21 NOTE — Telephone Encounter (Signed)
Left message instruction pt to take the prednisone to completion then begin the meloxicam the following day.

## 2017-02-27 NOTE — Progress Notes (Signed)
   HPI: 71 year old female presenting today as a new patient with a complaint of intermittent pain in the right Achilles tendon that has been present for the past month.  She reports associated swelling of the area.  Walking increases the pain.  Icing and taking Tylenol helps alleviate the pain.  She is here for further evaluation and treatment.   Past Medical History:  Diagnosis Date  . Anxiety   . Depression   . Fibromyalgia   . GERD (gastroesophageal reflux disease)   . Hyperlipidemia   . Hypertension   . Hypothyroidism       . Low kidney function    followed by Dr Alyson Ingles  . Spinal stenosis       Physical Exam: General: The patient is alert and oriented x3 in no acute distress.  Dermatology: Skin is warm, dry and supple bilateral lower extremities. Negative for open lesions or macerations.  Vascular: Palpable pedal pulses bilaterally. No edema or erythema noted. Capillary refill within normal limits.  Neurological: Epicritic and protective threshold grossly intact bilaterally.   Musculoskeletal Exam: Pain on palpation noted to the posterior tubercle of the right calcaneus at the insertion of the Achilles tendon consistent with retrocalcaneal bursitis. Range of motion within normal limits. Muscle strength 5/5 in all muscle groups bilateral lower extremities.  Radiographic Exam:  Posterior calcaneal spur noted to the respective calcaneus on lateral view. No fracture or dislocation noted. Normal osseous mineralization noted.     Assessment: 1. Insertional Achilles tendinitis right 2. Retrocalcaneal bursitis right   Plan of Care:  1. Patient was evaluated. Radiographs were reviewed today. 2. Injection of 0.5 mL Celestone Soluspan injected into the retrocalcaneal bursa. Care was taken to avoid direct injection into the Achilles tendon. 3.  Cam boot dispensed.  Weightbearing as tolerated. 4.  Prescription for Medrol Dosepak provided to patient. 5.  Prescription for meloxicam  provided to patient. 6.  Return to clinic after trip to [place].  Going to [place] until January 9.   Edrick Kins, DPM [facility]  Dr. Edrick Kins, [place] [address]                                        [place], [place] [address]                Office [phone]  Fax [phone]

## 2017-03-08 ENCOUNTER — Other Ambulatory Visit: Payer: Self-pay | Admitting: Podiatry

## 2017-03-08 DIAGNOSIS — M7661 Achilles tendinitis, right leg: Secondary | ICD-10-CM

## 2017-03-18 ENCOUNTER — Ambulatory Visit: Payer: Medicare HMO | Admitting: Podiatry

## 2017-03-18 DIAGNOSIS — M7661 Achilles tendinitis, right leg: Secondary | ICD-10-CM

## 2017-03-18 MED ORDER — METHYLPREDNISOLONE 4 MG PO TBPK: ORAL_TABLET | ORAL | 0 refills | Status: DC

## 2017-03-20 DIAGNOSIS — R69 Illness, unspecified: Secondary | ICD-10-CM | POA: Diagnosis not present

## 2017-03-20 NOTE — Progress Notes (Signed)
   HPI: 72 year old female presenting today for follow-up evaluation of Achilles tendinitis of the right lower extremity.  She states her pain has significantly improved.  She reports some continued swelling and intermittent pain. She also reports a blister to the medial aspect of the right foot.  She reports bruising to the toenail of the second digit of the right foot as well.  She has not done anything for treatment.  Patient is here for further evaluation and treatment.   Past Medical History:  Diagnosis Date  . Anxiety   . Depression   . Fibromyalgia   . GERD (gastroesophageal reflux disease)   . Hyperlipidemia   . Hypertension   . Hypothyroidism       . Low kidney function    followed by Dr Alyson Ingles  . Spinal stenosis       Physical Exam: General: The patient is alert and oriented x3 in no acute distress.  Dermatology: Skin is warm, dry and supple bilateral lower extremities. Negative for open lesions or macerations.  Vascular: Palpable pedal pulses bilaterally. No edema or erythema noted. Capillary refill within normal limits.  Neurological: Epicritic and protective threshold grossly intact bilaterally.   Musculoskeletal Exam: Pain on palpation noted to the posterior tubercle of the right calcaneus at the insertion of the Achilles tendon consistent with retrocalcaneal bursitis. Range of motion within normal limits. Muscle strength 5/5 in all muscle groups bilateral lower extremities.  Assessment: 1. Insertional Achilles tendinitis right 2. Stable blister lesion right medial foot -resolved   Plan of Care:  1. Patient was evaluated.  2. Injection of 0.5 mL Celestone Soluspan injected into the retrocalcaneal bursa. Care was taken to avoid direct injection into the Achilles tendon. 3.  Prescription for Medrol Dosepak provided to patient.  Did not continue taking meloxicam afterwards. 4.  Recommended weightbearing in cam boot times 4 weeks. 5.  Return to clinic in 4  weeks.  Has traveled all over the world.   Edrick Kins, DPM [facility]  Dr. Edrick Kins, [address]                                        [place], [place] [address]                Office [phone]  Fax [phone]

## 2017-03-22 ENCOUNTER — Other Ambulatory Visit: Payer: Self-pay | Admitting: Obstetrics & Gynecology

## 2017-03-22 DIAGNOSIS — Z1231 Encounter for screening mammogram for malignant neoplasm of breast: Secondary | ICD-10-CM

## 2017-03-27 DIAGNOSIS — H34832 Tributary (branch) retinal vein occlusion, left eye, with macular edema: Secondary | ICD-10-CM | POA: Diagnosis not present

## 2017-03-27 DIAGNOSIS — H3562 Retinal hemorrhage, left eye: Secondary | ICD-10-CM | POA: Diagnosis not present

## 2017-03-27 DIAGNOSIS — H43813 Vitreous degeneration, bilateral: Secondary | ICD-10-CM | POA: Diagnosis not present

## 2017-03-27 DIAGNOSIS — H3582 Retinal ischemia: Secondary | ICD-10-CM | POA: Diagnosis not present

## 2017-04-05 DIAGNOSIS — M109 Gout, unspecified: Secondary | ICD-10-CM

## 2017-04-05 HISTORY — DX: Gout, unspecified: M10.9

## 2017-04-06 DIAGNOSIS — M5416 Radiculopathy, lumbar region: Secondary | ICD-10-CM | POA: Diagnosis not present

## 2017-04-06 DIAGNOSIS — Z79899 Other long term (current) drug therapy: Secondary | ICD-10-CM | POA: Diagnosis not present

## 2017-04-06 DIAGNOSIS — M79606 Pain in leg, unspecified: Secondary | ICD-10-CM | POA: Diagnosis not present

## 2017-04-06 DIAGNOSIS — I10 Essential (primary) hypertension: Secondary | ICD-10-CM | POA: Diagnosis not present

## 2017-04-06 DIAGNOSIS — M129 Arthropathy, unspecified: Secondary | ICD-10-CM | POA: Diagnosis not present

## 2017-04-06 DIAGNOSIS — M797 Fibromyalgia: Secondary | ICD-10-CM | POA: Diagnosis not present

## 2017-04-09 DIAGNOSIS — R69 Illness, unspecified: Secondary | ICD-10-CM | POA: Diagnosis not present

## 2017-04-12 DIAGNOSIS — M79606 Pain in leg, unspecified: Secondary | ICD-10-CM | POA: Diagnosis not present

## 2017-04-15 ENCOUNTER — Ambulatory Visit: Payer: Medicare HMO | Admitting: Podiatry

## 2017-04-16 ENCOUNTER — Telehealth: Payer: Self-pay | Admitting: *Deleted

## 2017-04-16 MED ORDER — MELOXICAM 15 MG PO TABS: 15.0000 mg | ORAL_TABLET | Freq: Every day | ORAL | 0 refills | Status: DC

## 2017-04-16 NOTE — Telephone Encounter (Signed)
Refill request for #90 meloxicam. Dr. Trinidad Curet the refill.

## 2017-04-19 DIAGNOSIS — M109 Gout, unspecified: Secondary | ICD-10-CM | POA: Diagnosis not present

## 2017-04-19 DIAGNOSIS — M797 Fibromyalgia: Secondary | ICD-10-CM | POA: Diagnosis not present

## 2017-04-19 DIAGNOSIS — M5416 Radiculopathy, lumbar region: Secondary | ICD-10-CM | POA: Diagnosis not present

## 2017-04-19 DIAGNOSIS — M79606 Pain in leg, unspecified: Secondary | ICD-10-CM | POA: Diagnosis not present

## 2017-04-23 ENCOUNTER — Encounter: Payer: Self-pay | Admitting: Obstetrics & Gynecology

## 2017-04-23 ENCOUNTER — Ambulatory Visit (INDEPENDENT_AMBULATORY_CARE_PROVIDER_SITE_OTHER): Payer: Medicare HMO | Admitting: Obstetrics & Gynecology

## 2017-04-23 ENCOUNTER — Other Ambulatory Visit: Payer: Self-pay

## 2017-04-23 VITALS — BP 168/90 | HR 74 | Resp 18 | Ht 65.0 in | Wt 222.0 lb

## 2017-04-23 DIAGNOSIS — Z01419 Encounter for gynecological examination (general) (routine) without abnormal findings: Secondary | ICD-10-CM | POA: Diagnosis not present

## 2017-04-23 DIAGNOSIS — Z124 Encounter for screening for malignant neoplasm of cervix: Secondary | ICD-10-CM | POA: Diagnosis not present

## 2017-04-23 NOTE — Progress Notes (Signed)
72 y.o. [id number] MarriedCaucasianF here for annual exam.  Just got back from [place] with her sister and niece.  Had some achilles tendonitis before she left.  Now having gout and lots of joint pain right now.    Husband had to have another hip replacement.  This is same hip that had a prior hip replacement.  Considering rehab for a few days between.  Very upset that Education officer, [facility] at hospital is not supporting her desires.  Feels she can't care for husband yet.  Tearful today.  Having increased anxiety related to this.   Denies vaginal bleeding.     PCP:  Dr. Inda Castle, MD.  [facility] [address].    Patient's last menstrual period was 03/05/2000.          Sexually active: No.  The current method of family planning is post menopausal status.    Exercising: Yes.    bike, treadmill, weights Smoker:  no  Health Maintenance: Pap:  12/30/14 Neg. HR HPV:neg   12/25/12 Neg  History of abnormal Pap:  Yes, CIN II, LEEP 2010 MMG:  04/30/16 BIRADS1:Neg. Has appt 05/02/17 Colonoscopy:  12/2015 f/u 10 years  BMD:   04/27/16 Osteopenia  TDaP:  2010 Pneumonia vaccine(s):  Done Shingrix: Zostavax done  Hep C testing: 01/02/16 Neg  Screening Labs: PCP   reports that she has quit smoking. she has never used smokeless tobacco. She reports that she drinks about 0.6 oz of alcohol per week. She reports that she does not use drugs.  Past Medical History:  Diagnosis Date  . Anxiety   . Depression   . Fibromyalgia   . GERD (gastroesophageal reflux disease)   . Gout 04/2017  . Hyperlipidemia   . Hypertension   . Hypothyroidism       . Low kidney function    followed by Dr Alyson Ingles  . Spinal stenosis     Past Surgical History:  Procedure Laterality Date  . CATARACT EXTRACTION W/ INTRAOCULAR LENS IMPLANT Bilateral 10/25/2015   Right eye was first.  Left eye done 10/17.  . CERVICAL BIOPSY  W/ LOOP ELECTRODE EXCISION  12/2008   hx CIN II    Current Outpatient Medications  Medication Sig Dispense Refill  .  allopurinol (ZYLOPRIM) 100 MG tablet Take 100 mg by mouth daily.  3  . aspirin EC 81 MG EC tablet Take 1 tablet (81 mg total) by mouth daily. 30 tablet 1  . gemfibrozil (LOPID) 600 MG tablet Take 1 tablet (600 mg total) by mouth 2 (two) times daily before a meal. 60 tablet 0  . HYDROcodone-acetaminophen (NORCO) 10-325 MG tablet 2 (two) times daily as needed.  0  . levothyroxine (SYNTHROID, LEVOTHROID) 75 MCG tablet Take 150 mcg by mouth daily after breakfast.     . LYRICA 100 MG capsule TK 1 C PO BID  0  . meloxicam (MOBIC) 15 MG tablet Take 1 tablet (15 mg total) by mouth daily. 90 tablet 0  . metoprolol succinate (TOPROL-XL) 50 MG 24 hr tablet Take 50 mg by mouth daily. Take with or immediately following a meal.    . raloxifene (EVISTA) 60 MG tablet TAKE 1 TABLET BY MOUTH EVERY DAY 30 tablet 3  . triamterene-hydrochlorothiazide (DYAZIDE) 37.5-25 MG per capsule Take 1 capsule by mouth every morning.     No current facility-administered medications for this visit.     Family History  Problem Relation Age of Onset  . Osteoporosis Mother   . Hypertension Mother   .  Uterine cancer Mother   . Heart disease Mother        Atrial fibrillation  . Breast cancer Sister   . Osteoporosis Sister   . Diabetes Father     ROS:  Pertinent items are noted in HPI.  Otherwise, a comprehensive ROS was negative.  Exam:   BP (!) 168/90 (BP Location: Left Arm, Patient Position: Sitting, Cuff Size: Large)   Pulse 74   Resp 18   Ht 5\' 5"  (1.651 m)   Wt 222 lb (100.7 kg)   LMP 03/05/2000   BMI 36.94 kg/m     Height: 5\' 5"  (165.1 cm)  Ht Readings from Last 3 Encounters:  04/23/17 5\' 5"  (1.651 m)  01/02/16 5\' 5"  (1.651 m)  12/30/14 5' 5.5" (1.664 m)    General appearance: alert, cooperative and appears stated age Head: Normocephalic, without obvious abnormality, atraumatic Neck: no adenopathy, supple, symmetrical, trachea midline and thyroid normal to inspection and palpation Lungs: clear to  auscultation bilaterally Breasts: normal appearance, no masses or tenderness Heart: regular rate and rhythm Abdomen: soft, non-tender; bowel sounds normal; no masses,  no organomegaly Extremities: extremities normal, atraumatic, no cyanosis or edema Skin: Skin color, texture, turgor normal. No rashes or lesions Lymph nodes: Cervical, supraclavicular, and axillary nodes normal. No abnormal inguinal nodes palpated Neurologic: Grossly normal   Pelvic: External genitalia:  no lesions              Urethra:  normal appearing urethra with no masses, tenderness or lesions              Bartholins and Skenes: normal                 Vagina: normal appearing vagina with normal color and discharge, no lesions              Cervix: no lesions              Pap taken: Yes.   Bimanual Exam:  Uterus:  normal size, contour, position, consistency, mobility, non-tender              Adnexa: normal adnexa and no mass, fullness, tenderness               Rectovaginal: Confirms               Anus:  normal sphincter tone, no lesions  Chaperone was present for exam.  A:  Well Woman with normal exam PMP, no HRT H/O CIN 2, s/p LEEP 2010.  Neg pap with neg HR HPV 2013 and 2016.   Hypertension Elevated lipids Hypothyroidism and h/o nodule.  Followed by Dr. Chalmers Cater. Social stressors. Osteopenia  P:   Mammogram guidelines reviewed.  Doing yearly. pap smear obtained today.  Has not been 3 years so no HR HPV obtained today.  As will be over 3 years at next visit, decided to just obtain pap today. Lab work and vaccines are UTD except shingrix.  D/w pt this today. Advised pt to stop Evista.  Will plan to repeat BMD in 2 years. return annually or prn

## 2017-04-24 ENCOUNTER — Other Ambulatory Visit (HOSPITAL_COMMUNITY)
Admission: RE | Admit: 2017-04-24 | Discharge: 2017-04-24 | Disposition: A | Discharge status: Home / Self Care | Payer: Medicare HMO | Source: Ambulatory Visit | Attending: Obstetrics & Gynecology | Admitting: Obstetrics & Gynecology

## 2017-04-24 DIAGNOSIS — Z124 Encounter for screening for malignant neoplasm of cervix: Secondary | ICD-10-CM | POA: Insufficient documentation

## 2017-04-29 LAB — CYTOLOGY - PAP: Diagnosis: NEGATIVE

## 2017-05-02 ENCOUNTER — Ambulatory Visit
Admission: RE | Admit: 2017-05-02 | Discharge: 2017-05-02 | Disposition: A | Discharge status: Home / Self Care | Payer: Medicare HMO | Source: Ambulatory Visit | Attending: Obstetrics & Gynecology | Admitting: Obstetrics & Gynecology

## 2017-05-02 DIAGNOSIS — Z1231 Encounter for screening mammogram for malignant neoplasm of breast: Secondary | ICD-10-CM | POA: Diagnosis not present

## 2017-05-14 ENCOUNTER — Other Ambulatory Visit: Payer: Self-pay | Admitting: Obstetrics & Gynecology

## 2017-05-14 NOTE — Telephone Encounter (Signed)
Opened in error. Patient advised to stop evista by Dr. Sabra Heck per AEX note on 04/23/17.

## 2017-05-16 DIAGNOSIS — R600 Localized edema: Secondary | ICD-10-CM | POA: Diagnosis not present

## 2017-05-16 DIAGNOSIS — M79605 Pain in left leg: Secondary | ICD-10-CM | POA: Diagnosis not present

## 2017-05-16 DIAGNOSIS — M79604 Pain in right leg: Secondary | ICD-10-CM | POA: Diagnosis not present

## 2017-05-28 DIAGNOSIS — M25562 Pain in left knee: Secondary | ICD-10-CM | POA: Insufficient documentation

## 2017-05-30 ENCOUNTER — Telehealth: Payer: Self-pay | Admitting: Obstetrics & Gynecology

## 2017-05-31 NOTE — Telephone Encounter (Signed)
VOID

## 2017-06-04 DIAGNOSIS — M48062 Spinal stenosis, lumbar region with neurogenic claudication: Secondary | ICD-10-CM | POA: Diagnosis not present

## 2017-06-05 DIAGNOSIS — H34832 Tributary (branch) retinal vein occlusion, left eye, with macular edema: Secondary | ICD-10-CM | POA: Diagnosis not present

## 2017-06-05 DIAGNOSIS — H3582 Retinal ischemia: Secondary | ICD-10-CM | POA: Diagnosis not present

## 2017-06-05 DIAGNOSIS — H43813 Vitreous degeneration, bilateral: Secondary | ICD-10-CM | POA: Diagnosis not present

## 2017-06-07 DIAGNOSIS — R600 Localized edema: Secondary | ICD-10-CM | POA: Diagnosis not present

## 2017-06-07 DIAGNOSIS — M79605 Pain in left leg: Secondary | ICD-10-CM | POA: Diagnosis not present

## 2017-06-07 DIAGNOSIS — M79604 Pain in right leg: Secondary | ICD-10-CM | POA: Diagnosis not present

## 2017-06-07 DIAGNOSIS — M797 Fibromyalgia: Secondary | ICD-10-CM | POA: Diagnosis not present

## 2017-07-02 DIAGNOSIS — M48062 Spinal stenosis, lumbar region with neurogenic claudication: Secondary | ICD-10-CM | POA: Diagnosis not present

## 2017-07-08 DIAGNOSIS — M79605 Pain in left leg: Secondary | ICD-10-CM | POA: Diagnosis not present

## 2017-07-08 DIAGNOSIS — M545 Low back pain: Secondary | ICD-10-CM | POA: Diagnosis not present

## 2017-07-08 DIAGNOSIS — M6281 Muscle weakness (generalized): Secondary | ICD-10-CM | POA: Diagnosis not present

## 2017-07-08 DIAGNOSIS — R262 Difficulty in walking, not elsewhere classified: Secondary | ICD-10-CM | POA: Diagnosis not present

## 2017-07-09 DIAGNOSIS — M5416 Radiculopathy, lumbar region: Secondary | ICD-10-CM | POA: Diagnosis not present

## 2017-07-09 DIAGNOSIS — M797 Fibromyalgia: Secondary | ICD-10-CM | POA: Diagnosis not present

## 2017-07-09 DIAGNOSIS — I10 Essential (primary) hypertension: Secondary | ICD-10-CM | POA: Diagnosis not present

## 2017-07-10 DIAGNOSIS — M6281 Muscle weakness (generalized): Secondary | ICD-10-CM | POA: Diagnosis not present

## 2017-07-10 DIAGNOSIS — M545 Low back pain: Secondary | ICD-10-CM | POA: Diagnosis not present

## 2017-07-10 DIAGNOSIS — M79605 Pain in left leg: Secondary | ICD-10-CM | POA: Diagnosis not present

## 2017-07-10 DIAGNOSIS — R262 Difficulty in walking, not elsewhere classified: Secondary | ICD-10-CM | POA: Diagnosis not present

## 2017-07-11 ENCOUNTER — Telehealth: Payer: Self-pay | Admitting: *Deleted

## 2017-07-11 MED ORDER — MELOXICAM 15 MG PO TABS: 15.0000 mg | ORAL_TABLET | Freq: Every day | ORAL | 0 refills | Status: DC

## 2017-07-11 NOTE — Telephone Encounter (Signed)
Refill request for meloxicam. Dr.Evans states refill once and pt needs and appt.

## 2017-07-15 DIAGNOSIS — M79605 Pain in left leg: Secondary | ICD-10-CM | POA: Diagnosis not present

## 2017-07-15 DIAGNOSIS — M6281 Muscle weakness (generalized): Secondary | ICD-10-CM | POA: Diagnosis not present

## 2017-07-15 DIAGNOSIS — M545 Low back pain: Secondary | ICD-10-CM | POA: Diagnosis not present

## 2017-07-15 DIAGNOSIS — R262 Difficulty in walking, not elsewhere classified: Secondary | ICD-10-CM | POA: Diagnosis not present

## 2017-07-17 DIAGNOSIS — M6281 Muscle weakness (generalized): Secondary | ICD-10-CM | POA: Diagnosis not present

## 2017-07-17 DIAGNOSIS — M545 Low back pain: Secondary | ICD-10-CM | POA: Diagnosis not present

## 2017-07-17 DIAGNOSIS — R262 Difficulty in walking, not elsewhere classified: Secondary | ICD-10-CM | POA: Diagnosis not present

## 2017-07-17 DIAGNOSIS — M79605 Pain in left leg: Secondary | ICD-10-CM | POA: Diagnosis not present

## 2017-07-22 DIAGNOSIS — R262 Difficulty in walking, not elsewhere classified: Secondary | ICD-10-CM | POA: Diagnosis not present

## 2017-07-22 DIAGNOSIS — M545 Low back pain: Secondary | ICD-10-CM | POA: Diagnosis not present

## 2017-07-22 DIAGNOSIS — M6281 Muscle weakness (generalized): Secondary | ICD-10-CM | POA: Diagnosis not present

## 2017-07-22 DIAGNOSIS — M79605 Pain in left leg: Secondary | ICD-10-CM | POA: Diagnosis not present

## 2017-07-23 DIAGNOSIS — M5440 Lumbago with sciatica, unspecified side: Secondary | ICD-10-CM | POA: Diagnosis not present

## 2017-08-02 DIAGNOSIS — M6281 Muscle weakness (generalized): Secondary | ICD-10-CM | POA: Diagnosis not present

## 2017-08-02 DIAGNOSIS — R262 Difficulty in walking, not elsewhere classified: Secondary | ICD-10-CM | POA: Diagnosis not present

## 2017-08-02 DIAGNOSIS — M79605 Pain in left leg: Secondary | ICD-10-CM | POA: Diagnosis not present

## 2017-08-02 DIAGNOSIS — M545 Low back pain: Secondary | ICD-10-CM | POA: Diagnosis not present

## 2017-08-06 DIAGNOSIS — R262 Difficulty in walking, not elsewhere classified: Secondary | ICD-10-CM | POA: Diagnosis not present

## 2017-08-06 DIAGNOSIS — M79605 Pain in left leg: Secondary | ICD-10-CM | POA: Diagnosis not present

## 2017-08-06 DIAGNOSIS — M545 Low back pain: Secondary | ICD-10-CM | POA: Diagnosis not present

## 2017-08-06 DIAGNOSIS — M6281 Muscle weakness (generalized): Secondary | ICD-10-CM | POA: Diagnosis not present

## 2017-08-12 DIAGNOSIS — M797 Fibromyalgia: Secondary | ICD-10-CM | POA: Diagnosis not present

## 2017-08-12 DIAGNOSIS — M5416 Radiculopathy, lumbar region: Secondary | ICD-10-CM | POA: Diagnosis not present

## 2017-08-12 DIAGNOSIS — I10 Essential (primary) hypertension: Secondary | ICD-10-CM | POA: Diagnosis not present

## 2017-08-13 DIAGNOSIS — M6281 Muscle weakness (generalized): Secondary | ICD-10-CM | POA: Diagnosis not present

## 2017-08-13 DIAGNOSIS — M545 Low back pain: Secondary | ICD-10-CM | POA: Diagnosis not present

## 2017-08-13 DIAGNOSIS — M79605 Pain in left leg: Secondary | ICD-10-CM | POA: Diagnosis not present

## 2017-08-13 DIAGNOSIS — R262 Difficulty in walking, not elsewhere classified: Secondary | ICD-10-CM | POA: Diagnosis not present

## 2017-08-14 ENCOUNTER — Telehealth: Payer: Self-pay | Admitting: *Deleted

## 2017-08-14 NOTE — Telephone Encounter (Signed)
Request for meloxicam. Dr. Amalia Hailey states pt needs to be seen if continuing to have pain. Return fax denying.

## 2017-08-16 ENCOUNTER — Other Ambulatory Visit: Payer: Self-pay | Admitting: Neurosurgery

## 2017-08-16 ENCOUNTER — Ambulatory Visit
Admission: RE | Admit: 2017-08-16 | Discharge: 2017-08-16 | Disposition: A | Discharge status: Home / Self Care | Payer: Medicare HMO | Source: Ambulatory Visit | Attending: Neurosurgery | Admitting: Neurosurgery

## 2017-08-16 DIAGNOSIS — M5440 Lumbago with sciatica, unspecified side: Secondary | ICD-10-CM

## 2017-08-16 DIAGNOSIS — M48061 Spinal stenosis, lumbar region without neurogenic claudication: Secondary | ICD-10-CM | POA: Diagnosis not present

## 2017-08-20 DIAGNOSIS — H43813 Vitreous degeneration, bilateral: Secondary | ICD-10-CM | POA: Diagnosis not present

## 2017-08-20 DIAGNOSIS — H34832 Tributary (branch) retinal vein occlusion, left eye, with macular edema: Secondary | ICD-10-CM | POA: Diagnosis not present

## 2017-08-20 DIAGNOSIS — H3582 Retinal ischemia: Secondary | ICD-10-CM | POA: Diagnosis not present

## 2017-08-21 DIAGNOSIS — M6281 Muscle weakness (generalized): Secondary | ICD-10-CM | POA: Diagnosis not present

## 2017-08-21 DIAGNOSIS — R262 Difficulty in walking, not elsewhere classified: Secondary | ICD-10-CM | POA: Diagnosis not present

## 2017-08-21 DIAGNOSIS — M79605 Pain in left leg: Secondary | ICD-10-CM | POA: Diagnosis not present

## 2017-08-21 DIAGNOSIS — M545 Low back pain: Secondary | ICD-10-CM | POA: Diagnosis not present

## 2017-09-03 DIAGNOSIS — M4316 Spondylolisthesis, lumbar region: Secondary | ICD-10-CM | POA: Diagnosis not present

## 2017-09-03 DIAGNOSIS — M48062 Spinal stenosis, lumbar region with neurogenic claudication: Secondary | ICD-10-CM | POA: Diagnosis not present

## 2017-09-03 DIAGNOSIS — M5440 Lumbago with sciatica, unspecified side: Secondary | ICD-10-CM | POA: Diagnosis not present

## 2017-09-03 DIAGNOSIS — Z6838 Body mass index (BMI) 38.0-38.9, adult: Secondary | ICD-10-CM | POA: Diagnosis not present

## 2017-09-03 DIAGNOSIS — I10 Essential (primary) hypertension: Secondary | ICD-10-CM | POA: Diagnosis not present

## 2017-09-24 DIAGNOSIS — M48061 Spinal stenosis, lumbar region without neurogenic claudication: Secondary | ICD-10-CM | POA: Diagnosis not present

## 2017-09-24 DIAGNOSIS — M5416 Radiculopathy, lumbar region: Secondary | ICD-10-CM | POA: Diagnosis not present

## 2017-09-24 DIAGNOSIS — M797 Fibromyalgia: Secondary | ICD-10-CM | POA: Diagnosis not present

## 2017-09-24 DIAGNOSIS — Z79899 Other long term (current) drug therapy: Secondary | ICD-10-CM | POA: Diagnosis not present

## 2017-09-26 DIAGNOSIS — R69 Illness, unspecified: Secondary | ICD-10-CM | POA: Diagnosis not present

## 2017-09-30 DIAGNOSIS — R69 Illness, unspecified: Secondary | ICD-10-CM | POA: Diagnosis not present

## 2017-10-01 DIAGNOSIS — Z6838 Body mass index (BMI) 38.0-38.9, adult: Secondary | ICD-10-CM | POA: Diagnosis not present

## 2017-10-01 DIAGNOSIS — I10 Essential (primary) hypertension: Secondary | ICD-10-CM | POA: Diagnosis not present

## 2017-10-01 DIAGNOSIS — M4316 Spondylolisthesis, lumbar region: Secondary | ICD-10-CM | POA: Diagnosis not present

## 2017-10-01 DIAGNOSIS — M48062 Spinal stenosis, lumbar region with neurogenic claudication: Secondary | ICD-10-CM | POA: Diagnosis not present

## 2017-10-10 DIAGNOSIS — M48062 Spinal stenosis, lumbar region with neurogenic claudication: Secondary | ICD-10-CM | POA: Diagnosis not present

## 2017-10-21 ENCOUNTER — Ambulatory Visit: Payer: Medicare HMO | Admitting: Podiatry

## 2017-10-21 ENCOUNTER — Other Ambulatory Visit: Payer: Self-pay

## 2017-10-21 DIAGNOSIS — M659 Synovitis and tenosynovitis, unspecified: Secondary | ICD-10-CM

## 2017-10-21 DIAGNOSIS — M65171 Other infective (teno)synovitis, right ankle and foot: Secondary | ICD-10-CM

## 2017-10-23 DIAGNOSIS — L738 Other specified follicular disorders: Secondary | ICD-10-CM | POA: Diagnosis not present

## 2017-10-23 DIAGNOSIS — D225 Melanocytic nevi of trunk: Secondary | ICD-10-CM | POA: Diagnosis not present

## 2017-10-23 NOTE — Progress Notes (Signed)
   Subjective:  72 year old female presenting today with a chief complaint of right foot and ankle pain that began a few weeks ago. Walking, especially down stairs, increases the pain. She has not done anything for treatment. Patient is here for further evaluation and treatment.   Past Medical History:  Diagnosis Date  . Anxiety   . Depression   . Fibromyalgia   . GERD (gastroesophageal reflux disease)   . Gout 04/2017  . Hyperlipidemia   . Hypertension   . Hypothyroidism       . Low kidney function    followed by Dr Alyson Ingles  . Spinal stenosis      Objective / Physical Exam:  General:  The patient is alert and oriented x3 in no acute distress. Dermatology:  Skin is warm, dry and supple bilateral lower extremities. Negative for open lesions or macerations. Vascular:  Palpable pedal pulses bilaterally. No edema or erythema noted. Capillary refill within normal limits. Neurological:  Epicritic and protective threshold grossly intact bilaterally.  Musculoskeletal Exam:  Pain on palpation to the anterior lateral medial aspects of the patient's right ankle. Mild edema noted. Range of motion within normal limits to all pedal and ankle joints bilateral. Muscle strength 5/5 in all groups bilateral.   Radiographic Exam:  Normal osseous mineralization. Joint spaces preserved. No fracture/dislocation/boney destruction.    Assessment: 1. pain in right ankle 2. synovitis of right ankle  Plan of Care:  1. Patient was evaluated. X-Rays reviewed.  2. injection of 0.5 mL Celestone Soluspan injected in the patient's right ankle. 3. Recommended good shoe gear.  4. ankle brace dispensed 5. patient is to return to clinic in 4 weeks  Has traveled all over the world.   Edrick Kins, DPM [facility]  Dr. Edrick Kins, [address]                                        [place], [place] [address]                Office [phone]  Fax [phone]

## 2017-10-29 DIAGNOSIS — H43813 Vitreous degeneration, bilateral: Secondary | ICD-10-CM | POA: Diagnosis not present

## 2017-10-29 DIAGNOSIS — H34832 Tributary (branch) retinal vein occlusion, left eye, with macular edema: Secondary | ICD-10-CM | POA: Diagnosis not present

## 2017-10-29 DIAGNOSIS — H3582 Retinal ischemia: Secondary | ICD-10-CM | POA: Diagnosis not present

## 2017-10-29 DIAGNOSIS — H3562 Retinal hemorrhage, left eye: Secondary | ICD-10-CM | POA: Diagnosis not present

## 2017-10-31 DIAGNOSIS — I10 Essential (primary) hypertension: Secondary | ICD-10-CM | POA: Diagnosis not present

## 2017-10-31 DIAGNOSIS — Z6838 Body mass index (BMI) 38.0-38.9, adult: Secondary | ICD-10-CM | POA: Diagnosis not present

## 2017-10-31 DIAGNOSIS — M4316 Spondylolisthesis, lumbar region: Secondary | ICD-10-CM | POA: Diagnosis not present

## 2017-10-31 DIAGNOSIS — M48062 Spinal stenosis, lumbar region with neurogenic claudication: Secondary | ICD-10-CM | POA: Diagnosis not present

## 2017-11-01 DIAGNOSIS — I10 Essential (primary) hypertension: Secondary | ICD-10-CM | POA: Diagnosis not present

## 2017-11-01 DIAGNOSIS — M5416 Radiculopathy, lumbar region: Secondary | ICD-10-CM | POA: Diagnosis not present

## 2017-11-01 DIAGNOSIS — M797 Fibromyalgia: Secondary | ICD-10-CM | POA: Diagnosis not present

## 2017-11-18 ENCOUNTER — Ambulatory Visit: Payer: Medicare HMO | Admitting: Podiatry

## 2017-11-25 ENCOUNTER — Ambulatory Visit: Payer: Medicare HMO | Admitting: Podiatry

## 2017-12-06 DIAGNOSIS — M5416 Radiculopathy, lumbar region: Secondary | ICD-10-CM | POA: Diagnosis not present

## 2017-12-06 DIAGNOSIS — M48061 Spinal stenosis, lumbar region without neurogenic claudication: Secondary | ICD-10-CM | POA: Diagnosis not present

## 2017-12-09 ENCOUNTER — Ambulatory Visit: Payer: Medicare HMO | Admitting: Podiatry

## 2017-12-09 DIAGNOSIS — M76821 Posterior tibial tendinitis, right leg: Secondary | ICD-10-CM

## 2017-12-09 MED ORDER — MELOXICAM 15 MG PO TABS: 15.0000 mg | ORAL_TABLET | Freq: Every day | ORAL | 0 refills | Status: AC

## 2017-12-10 NOTE — Progress Notes (Signed)
    HPI: 72 year old female presenting today for follow up evaluation of right ankle pain. She states the pain has not changed since her last visit. She has been using the ankle brace as directed. There are no modifying factors noted. Patient is here for further evaluation and treatment.   Past Medical History:  Diagnosis Date  . Anxiety   . Depression   . Fibromyalgia   . GERD (gastroesophageal reflux disease)   . Gout 04/2017  . Hyperlipidemia   . Hypertension   . Hypothyroidism       . Low kidney function    followed by Dr Alyson Ingles  . Spinal stenosis        Physical Exam: General: The patient is alert and oriented x3 in no acute distress.  Dermatology: Skin is warm, dry and supple bilateral lower extremities. Negative for open lesions or macerations.  Vascular: Palpable pedal pulses bilaterally. No edema or erythema noted. Capillary refill within normal limits.  Neurological: Epicritic and protective threshold grossly intact bilaterally.   Musculoskeletal Exam: Pain on palpation noted to the posterior tibial tendon of the right foot. Range of motion within normal limits. Muscle strength 5/5 in all muscle groups bilateral lower extremities.  Assessment: 1. Posterior tibial tendinitis right   Plan of Care:  1. Patient was evaluated.  2. Injection of 0.5 mL Celestone Soluspan injected into the posterior tibial tendon sheath.  3. Plantar fascial brace dispensed.  4. Recommended good shoe gear.  5. Prescription for Meloxicam provided to patient.  6. Return to clinic in 4 weeks.    Edrick Kins, DPM [facility]  Dr. Edrick Kins, [address]                                        [place], [place] [address]                Office [phone]  Fax [phone]

## 2017-12-16 DIAGNOSIS — R69 Illness, unspecified: Secondary | ICD-10-CM | POA: Diagnosis not present

## 2017-12-16 DIAGNOSIS — B372 Candidiasis of skin and nail: Secondary | ICD-10-CM | POA: Diagnosis not present

## 2017-12-16 DIAGNOSIS — R110 Nausea: Secondary | ICD-10-CM | POA: Diagnosis not present

## 2018-01-03 DIAGNOSIS — R69 Illness, unspecified: Secondary | ICD-10-CM | POA: Diagnosis not present

## 2018-01-09 DIAGNOSIS — M4316 Spondylolisthesis, lumbar region: Secondary | ICD-10-CM | POA: Diagnosis not present

## 2018-01-09 DIAGNOSIS — M48062 Spinal stenosis, lumbar region with neurogenic claudication: Secondary | ICD-10-CM | POA: Diagnosis not present

## 2018-01-10 DIAGNOSIS — M48061 Spinal stenosis, lumbar region without neurogenic claudication: Secondary | ICD-10-CM | POA: Diagnosis not present

## 2018-01-10 DIAGNOSIS — M5416 Radiculopathy, lumbar region: Secondary | ICD-10-CM | POA: Diagnosis not present

## 2018-01-10 DIAGNOSIS — R69 Illness, unspecified: Secondary | ICD-10-CM | POA: Diagnosis not present

## 2018-01-10 DIAGNOSIS — Z79899 Other long term (current) drug therapy: Secondary | ICD-10-CM | POA: Diagnosis not present

## 2018-01-15 ENCOUNTER — Other Ambulatory Visit: Payer: Self-pay

## 2018-01-15 MED ORDER — MELOXICAM 15 MG PO TABS: ORAL_TABLET | ORAL | 0 refills | Status: DC

## 2018-01-20 DIAGNOSIS — R7303 Prediabetes: Secondary | ICD-10-CM | POA: Diagnosis not present

## 2018-01-20 DIAGNOSIS — E668 Other obesity: Secondary | ICD-10-CM | POA: Diagnosis not present

## 2018-01-20 DIAGNOSIS — E782 Mixed hyperlipidemia: Secondary | ICD-10-CM | POA: Diagnosis not present

## 2018-01-20 DIAGNOSIS — I10 Essential (primary) hypertension: Secondary | ICD-10-CM | POA: Diagnosis not present

## 2018-01-21 DIAGNOSIS — H34832 Tributary (branch) retinal vein occlusion, left eye, with macular edema: Secondary | ICD-10-CM | POA: Diagnosis not present

## 2018-01-21 DIAGNOSIS — H3562 Retinal hemorrhage, left eye: Secondary | ICD-10-CM | POA: Diagnosis not present

## 2018-01-21 DIAGNOSIS — H43813 Vitreous degeneration, bilateral: Secondary | ICD-10-CM | POA: Diagnosis not present

## 2018-01-24 DIAGNOSIS — H5212 Myopia, left eye: Secondary | ICD-10-CM | POA: Diagnosis not present

## 2018-01-24 DIAGNOSIS — Z961 Presence of intraocular lens: Secondary | ICD-10-CM | POA: Diagnosis not present

## 2018-01-24 DIAGNOSIS — H34832 Tributary (branch) retinal vein occlusion, left eye, with macular edema: Secondary | ICD-10-CM | POA: Diagnosis not present

## 2018-02-06 DIAGNOSIS — M5416 Radiculopathy, lumbar region: Secondary | ICD-10-CM | POA: Diagnosis not present

## 2018-02-06 DIAGNOSIS — Z79899 Other long term (current) drug therapy: Secondary | ICD-10-CM | POA: Diagnosis not present

## 2018-02-06 DIAGNOSIS — I10 Essential (primary) hypertension: Secondary | ICD-10-CM | POA: Diagnosis not present

## 2018-02-17 DIAGNOSIS — I10 Essential (primary) hypertension: Secondary | ICD-10-CM | POA: Diagnosis not present

## 2018-02-17 DIAGNOSIS — M48062 Spinal stenosis, lumbar region with neurogenic claudication: Secondary | ICD-10-CM | POA: Diagnosis not present

## 2018-02-17 DIAGNOSIS — Z6839 Body mass index (BMI) 39.0-39.9, adult: Secondary | ICD-10-CM | POA: Diagnosis not present

## 2018-02-17 DIAGNOSIS — M4316 Spondylolisthesis, lumbar region: Secondary | ICD-10-CM | POA: Diagnosis not present

## 2018-02-24 ENCOUNTER — Encounter: Payer: Self-pay | Admitting: Podiatry

## 2018-02-24 ENCOUNTER — Telehealth: Payer: Self-pay | Admitting: *Deleted

## 2018-02-24 ENCOUNTER — Ambulatory Visit: Payer: Medicare HMO | Admitting: Podiatry

## 2018-02-24 DIAGNOSIS — M76821 Posterior tibial tendinitis, right leg: Secondary | ICD-10-CM

## 2018-02-24 DIAGNOSIS — T148XXA Other injury of unspecified body region, initial encounter: Secondary | ICD-10-CM

## 2018-02-24 DIAGNOSIS — M659 Synovitis and tenosynovitis, unspecified: Secondary | ICD-10-CM

## 2018-02-24 DIAGNOSIS — M7661 Achilles tendinitis, right leg: Secondary | ICD-10-CM

## 2018-02-24 NOTE — Telephone Encounter (Signed)
-----   Message from Edrick Kins, DPM sent at 02/24/2018 10:37 AM EST ----- Regarding: MRI right ankle Please order MRI right ankle without contrast  Diagnosis: Posterior tibial tendinitis right  Thanks, Dr. Amalia Hailey  Encounter from today is dictated :)

## 2018-02-24 NOTE — Telephone Encounter (Signed)
Faxed orders to [facility]. 

## 2018-02-24 NOTE — Progress Notes (Signed)
   HPI: 72 year old female presents for follow-up treatment evaluation regarding posterior tibial tendinitis right lower extremity.  Patient continues to have pain and tenderness.  This began approximately in October 2018 and there is been no alleviation of symptoms for the patient that is been long-lasting.  All conservative treatment modalities have only been temporary to improve the patient's symptoms.  She presents for further treatment evaluation  Past Medical History:  Diagnosis Date  . Anxiety   . Depression   . Fibromyalgia   . GERD (gastroesophageal reflux disease)   . Gout 04/2017  . Hyperlipidemia   . Hypertension   . Hypothyroidism       . Low kidney function    followed by Dr Alyson Ingles  . Spinal stenosis      Physical Exam: General: The patient is alert and oriented x3 in no acute distress.  Dermatology: Skin is warm, dry and supple bilateral lower extremities. Negative for open lesions or macerations.  Vascular: Palpable pedal pulses bilaterally. No edema or erythema noted. Capillary refill within normal limits.  Neurological: Epicritic and protective threshold grossly intact bilaterally.   Musculoskeletal Exam: Pain on palpation along the posterior tibial tendon right lower extremity.  Pain on palpation also noted to the lateral aspect of the right foot and ankle likely secondary to compensation.  Range of motion within normal limits to all pedal and ankle joints bilateral. Muscle strength 5/5 in all groups bilateral.   Assessment: 1.  Chronic posterior tibial tendinitis right   Plan of Care:  1. Patient evaluated.  2.  Today we are going to order an MRI right ankle.  Conservative modalities have been unsuccessful for the past year.  At this point we need to rule out posterior tibial tendon tear. 3.  Continue wearing good supportive shoe gear and taking meloxicam daily 4.  Return to clinic in 4 weeks to review MRI results      Edrick Kins, DPM [facility]  Dr. Edrick Kins, DPM    [address], [place] [address]                Office [phone]  Fax [phone]

## 2018-02-24 NOTE — Telephone Encounter (Signed)
[facility] request clinicals to review prior to pre-cert MRI [id number]. Faxed clinicals to [facility].

## 2018-02-27 NOTE — Telephone Encounter (Signed)
[facility] AUTHORIZED MRI [id number] RIGHT ANKLE, AUTHORIZATION NUMBER: [id number], VALID 02/25/2018 THROUGH 05/26/2018. Faxed to [facility].

## 2018-03-08 ENCOUNTER — Other Ambulatory Visit: Payer: Medicare Other

## 2018-03-12 ENCOUNTER — Ambulatory Visit
Admission: RE | Admit: 2018-03-12 | Discharge: 2018-03-12 | Disposition: A | Discharge status: Home / Self Care | Payer: Medicare Other | Source: Ambulatory Visit | Attending: Podiatry | Admitting: Podiatry

## 2018-03-12 DIAGNOSIS — M659 Synovitis and tenosynovitis, unspecified: Secondary | ICD-10-CM

## 2018-03-12 DIAGNOSIS — M7661 Achilles tendinitis, right leg: Secondary | ICD-10-CM

## 2018-03-12 DIAGNOSIS — M76821 Posterior tibial tendinitis, right leg: Secondary | ICD-10-CM

## 2018-03-12 DIAGNOSIS — T148XXA Other injury of unspecified body region, initial encounter: Secondary | ICD-10-CM

## 2018-03-19 ENCOUNTER — Ambulatory Visit (INDEPENDENT_AMBULATORY_CARE_PROVIDER_SITE_OTHER): Payer: Medicare Other

## 2018-03-19 ENCOUNTER — Encounter: Payer: Self-pay | Admitting: Podiatry

## 2018-03-19 ENCOUNTER — Ambulatory Visit: Payer: Medicare Other | Admitting: Podiatry

## 2018-03-19 DIAGNOSIS — M722 Plantar fascial fibromatosis: Secondary | ICD-10-CM

## 2018-03-19 DIAGNOSIS — M659 Synovitis and tenosynovitis, unspecified: Secondary | ICD-10-CM | POA: Diagnosis not present

## 2018-03-19 DIAGNOSIS — M76821 Posterior tibial tendinitis, right leg: Secondary | ICD-10-CM | POA: Diagnosis not present

## 2018-03-19 DIAGNOSIS — M7671 Peroneal tendinitis, right leg: Secondary | ICD-10-CM | POA: Diagnosis not present

## 2018-03-19 MED ORDER — MELOXICAM 15 MG PO TABS: ORAL_TABLET | ORAL | 0 refills | Status: DC

## 2018-03-19 MED ORDER — METHYLPREDNISOLONE 4 MG PO TBPK: ORAL_TABLET | ORAL | 0 refills | Status: DC

## 2018-03-23 NOTE — Progress Notes (Signed)
   HPI: 73 year old female presents for follow-up treatment evaluation regarding posterior tibial tendinitis of the right lower extremity. She states the pain is relatively unchanged since her previous visit. She has had an MRI and has been taking Meloxicam for treatment. Walking increases the pain.  She also notes intermittent pain to the plantar aspect of the left heel that began about one week ago. She reports associated intermittent swelling. Walking increases the symptoms. She has not done anything specific for treatment but is taking Meloxicam with some relief. Patient is here for further evaluation and treatment.   Past Medical History:  Diagnosis Date  . Anxiety   . Depression   . Fibromyalgia   . GERD (gastroesophageal reflux disease)   . Gout 04/2017  . Hyperlipidemia   . Hypertension   . Hypothyroidism       . Low kidney function    followed by Dr Alyson Ingles  . Spinal stenosis      Physical Exam: General: The patient is alert and oriented x3 in no acute distress.  Dermatology: Skin is warm, dry and supple bilateral lower extremities. Negative for open lesions or macerations.  Vascular: Palpable pedal pulses bilaterally. No edema or erythema noted. Capillary refill within normal limits.  Neurological: Epicritic and protective threshold grossly intact bilaterally.   Musculoskeletal Exam: Pain on palpation along the posterior tibial tendon right lower extremity.  Pain on palpation also noted to the lateral aspect of the right foot and ankle. Range of motion within normal limits to all pedal and ankle joints bilateral. Muscle strength 5/5 in all groups bilateral.   MRI Impression:  1. Severe tendinosis of the Achilles tendon with a large full-thickness tear involving the lateral half of the Achilles tendon with 3.5 cm of retraction. 2. Moderate tendinosis of the peroneus brevis with a high-grade partial-thickness tear. 3. Small amount of fluid in the posterior tibial tendon  sheath. 4. Subcortical reactive marrow edema in the anteromedial tibiotalar joint.  Assessment: 1. Posterior tibial tendinitis right 2. Partial tear peroneus brevis right    Plan of Care:  1. Patient evaluated. MRI reviewed.  2. Prescription for Medrol Dose Pak provided to patient. 3. Prescription for Meloxicam provided to patient. 4. Appointment with Liliane Channel, Pedorthist, for custom molded orthotics.  5. Compression anklets dispensed bilaterally.  6. Return to clinic in 3-4 weeks after orthotics use. If not better, we will proceed with surgery.       Edrick Kins, DPM [facility]  Dr. Edrick Kins, DPM    [address], [place] [address]                Office [phone]  Fax [phone]

## 2018-03-24 ENCOUNTER — Ambulatory Visit: Payer: Medicare HMO | Admitting: Podiatry

## 2018-03-24 ENCOUNTER — Ambulatory Visit (INDEPENDENT_AMBULATORY_CARE_PROVIDER_SITE_OTHER): Payer: Medicare Other | Admitting: Orthotics

## 2018-03-24 DIAGNOSIS — M76821 Posterior tibial tendinitis, right leg: Secondary | ICD-10-CM

## 2018-03-24 DIAGNOSIS — M722 Plantar fascial fibromatosis: Secondary | ICD-10-CM | POA: Diagnosis not present

## 2018-03-24 DIAGNOSIS — M7671 Peroneal tendinitis, right leg: Secondary | ICD-10-CM

## 2018-03-24 DIAGNOSIS — M659 Synovitis and tenosynovitis, unspecified: Secondary | ICD-10-CM

## 2018-03-24 NOTE — Progress Notes (Signed)
Patient presents today with a hx of PTTD/AAF.  Upon assessment, patient has pronounced pes planus w/ a valgus RF deformity.  Patient has medially shifted talus/navicular.  Goal is provide longitudinal arch support and RF stability.  Plan on deep heel cup, hug arch, wide foot orthosis w/ medial flange and varus correction for RF valgus deformity.  Patient educated in the progessive nature of PTTD and financial responsibility.  

## 2018-03-31 ENCOUNTER — Telehealth: Payer: Self-pay | Admitting: Podiatry

## 2018-03-31 NOTE — Telephone Encounter (Signed)
I'm requesting a handicap sticker.

## 2018-03-31 NOTE — Telephone Encounter (Signed)
Dr. Trinidad Curet 3 month handicap sticker.

## 2018-04-14 ENCOUNTER — Other Ambulatory Visit: Payer: Medicare Other | Admitting: Orthotics

## 2018-04-15 ENCOUNTER — Other Ambulatory Visit: Payer: Self-pay | Admitting: Obstetrics & Gynecology

## 2018-04-15 ENCOUNTER — Ambulatory Visit: Payer: Medicare Other | Admitting: Orthotics

## 2018-04-15 DIAGNOSIS — Z1231 Encounter for screening mammogram for malignant neoplasm of breast: Secondary | ICD-10-CM

## 2018-04-15 DIAGNOSIS — M7671 Peroneal tendinitis, right leg: Secondary | ICD-10-CM

## 2018-04-15 DIAGNOSIS — M722 Plantar fascial fibromatosis: Secondary | ICD-10-CM

## 2018-04-15 DIAGNOSIS — M659 Synovitis and tenosynovitis, unspecified: Secondary | ICD-10-CM

## 2018-04-15 DIAGNOSIS — M76821 Posterior tibial tendinitis, right leg: Secondary | ICD-10-CM

## 2018-04-15 NOTE — Progress Notes (Signed)
Patient came in today to p/up functional foot orthotics.   The orthotics were assessed to both fit and function.  The F/O addressed the biomechanical issues/pathologies as intended, offering good longitudinal arch support, proper offloading, and foot support. There weren't any signs of discomfort or irritation.  The F/O fit properly in footwear with minimal trimming/adjustments. 

## 2018-05-16 ENCOUNTER — Ambulatory Visit
Admission: RE | Admit: 2018-05-16 | Discharge: 2018-05-16 | Disposition: A | Discharge status: Home / Self Care | Payer: Medicare Other | Source: Ambulatory Visit | Attending: Obstetrics & Gynecology | Admitting: Obstetrics & Gynecology

## 2018-05-16 ENCOUNTER — Other Ambulatory Visit: Payer: Self-pay

## 2018-05-16 DIAGNOSIS — Z1231 Encounter for screening mammogram for malignant neoplasm of breast: Secondary | ICD-10-CM

## 2018-05-21 ENCOUNTER — Ambulatory Visit: Payer: Medicare Other | Admitting: Podiatry

## 2018-05-29 ENCOUNTER — Emergency Department (HOSPITAL_BASED_OUTPATIENT_CLINIC_OR_DEPARTMENT_OTHER): Payer: Medicare Other

## 2018-05-29 ENCOUNTER — Emergency Department (HOSPITAL_BASED_OUTPATIENT_CLINIC_OR_DEPARTMENT_OTHER)
Admission: EM | Admit: 2018-05-29 | Discharge: 2018-05-29 | Disposition: A | Discharge status: Home / Self Care | Payer: Medicare Other | Attending: Emergency Medicine | Admitting: Emergency Medicine

## 2018-05-29 ENCOUNTER — Encounter (HOSPITAL_BASED_OUTPATIENT_CLINIC_OR_DEPARTMENT_OTHER): Payer: Self-pay | Admitting: *Deleted

## 2018-05-29 ENCOUNTER — Other Ambulatory Visit: Payer: Self-pay

## 2018-05-29 DIAGNOSIS — M7989 Other specified soft tissue disorders: Secondary | ICD-10-CM

## 2018-05-29 DIAGNOSIS — Z87891 Personal history of nicotine dependence: Secondary | ICD-10-CM | POA: Insufficient documentation

## 2018-05-29 DIAGNOSIS — I10 Essential (primary) hypertension: Secondary | ICD-10-CM | POA: Insufficient documentation

## 2018-05-29 DIAGNOSIS — E039 Hypothyroidism, unspecified: Secondary | ICD-10-CM | POA: Diagnosis not present

## 2018-05-29 DIAGNOSIS — M79605 Pain in left leg: Secondary | ICD-10-CM | POA: Diagnosis present

## 2018-05-29 NOTE — ED Triage Notes (Signed)
Presents with left leg swelling, left ankle noted to be more than rt ankle, pt states onset, approx 2 weeks ago, denies injury to area. States went to her Primary Care MD and prescribed Lasix 20mg , 2 po qday, states no relief

## 2018-05-29 NOTE — ED Notes (Signed)
Patient transported to X-ray 

## 2018-05-29 NOTE — ED Notes (Signed)
Palpating post tib and popliteal pulse pt c/o tenderness

## 2018-05-29 NOTE — ED Notes (Signed)
Pt denies any chest pain, having some SOB per pt statement

## 2018-05-29 NOTE — ED Notes (Signed)
DC instructions reviewed with pt, opportunity for questions provided, reinforced MD instructions to make follow up appt with primary MD re: dosage changes of diuretic, also discussed being safe at home due to numbness and tingling sensation in feet. Copy of DC instructions provided to pt.

## 2018-05-29 NOTE — ED Notes (Signed)
Patient transported to Ultrasound 

## 2018-05-29 NOTE — Discharge Instructions (Signed)
Your x-ray and ultrasound results were normal. You can take an extra lasix (60mg ) total for the next 3 days and then return to your normal 40 mg dose. Call your PCP tomorrow to discuss your ED visit. Return to the ED with any new or worsening symptoms.

## 2018-05-29 NOTE — ED Notes (Signed)
ED Provider at bedside. 

## 2018-05-29 NOTE — ED Provider Notes (Signed)
Emergency Department Provider Note   I have reviewed the triage vital signs and the nursing notes.   HISTORY  Chief Complaint Leg Swelling   HPI Patricia Parks is a 73 y.o. female with PMH of depression, anxiety, HTN, HLD presents to the emergency department for evaluation of her lower extremity swelling worse on the left with mild dyspnea.  Patient has had this issue ongoing for the last 2 weeks.  She was started on Lasix 40 mg daily at that time and symptoms have not significantly improved.  Denies any fevers or chills.  No respiratory symptoms other than mild dyspnea.  No chest pain. No DVT history. No recent surgery or prolonged inactivity.    Past Medical History:  Diagnosis Date  . Anxiety   . Depression   . Fibromyalgia   . GERD (gastroesophageal reflux disease)   . Gout 04/2017  . Hyperlipidemia   . Hypertension   . Hypothyroidism       . Low kidney function    followed by Dr Alyson Ingles  . Spinal stenosis     Patient Active Problem List   Diagnosis Date Noted  . Pain in left knee 05/28/2017  . Elevated lipids 12/30/2014  . History of cervical dysplasia 12/30/2014  . Essential hypertension 09/10/2013  . Chest pain 08/28/2013    Past Surgical History:  Procedure Laterality Date  . CATARACT EXTRACTION W/ INTRAOCULAR LENS IMPLANT Bilateral 10/25/2015   Right eye was first.  Left eye done 10/17.  . CERVICAL BIOPSY  W/ LOOP ELECTRODE EXCISION  12/2008   hx CIN II    Allergies Patient has no known allergies.  Family History  Problem Relation Age of Onset  . Osteoporosis Mother   . Hypertension Mother   . Uterine cancer Mother   . Heart disease Mother        Atrial fibrillation  . Breast cancer Sister   . Osteoporosis Sister   . Diabetes Father     Social History Social History   Tobacco Use  . Smoking status: Former Research scientist (life sciences)  . Smokeless tobacco: Never Used  . Tobacco comment: in the 70s  Substance Use Topics  . Alcohol use: Yes     Alcohol/week: 1.0 standard drinks    Types: 1 Standard drinks or equivalent per week    Comment: Occasional  . Drug use: No    Review of Systems  Constitutional: No fever/chills Eyes: No visual changes. ENT: No sore throat. Cardiovascular: Denies chest pain. Respiratory: Positive mild shortness of breath. Gastrointestinal: No abdominal pain.  No nausea, no vomiting.  No diarrhea.  No constipation. Genitourinary: Negative for dysuria. Musculoskeletal: Positive bilateral leg pain L > R.  Skin: Negative for rash. Neurological: Negative for headaches, focal weakness or numbness.  10-point ROS otherwise negative.  ____________________________________________   PHYSICAL EXAM:  VITAL SIGNS: ED Triage Vitals  Enc Vitals Group     BP 05/29/18 1950 (!) 142/80     Pulse Rate 05/29/18 1950 89     Resp 05/29/18 1950 16     Temp 05/29/18 1950 98.5 F (36.9 C)     Temp Source 05/29/18 1950 Oral     SpO2 05/29/18 1950 96 %     Weight 05/29/18 1950 240 lb (108.9 kg)     Height 05/29/18 1950 5\' 5"  (1.651 m)     Pain Score 05/29/18 1953 3   Constitutional: Alert and oriented. Well appearing and in no acute distress. Eyes: Conjunctivae are normal.  Head: Atraumatic.  Nose: No congestion/rhinnorhea. Mouth/Throat: Mucous membranes are moist.  Neck: No stridor. Cardiovascular: Normal rate, regular rhythm. Good peripheral circulation. Grossly normal heart sounds.   Respiratory: Normal respiratory effort.  No retractions. Lungs CTAB. Gastrointestinal: Soft and nontender. No distention.  Musculoskeletal: Left leg swelling without erythema or cellulitis. Mild right leg swelling.  Neurologic:  Normal speech and language. No gross focal neurologic deficits are appreciated.  Skin:  Skin is warm, dry and intact. No rash noted.  ____________________________________________  RADIOLOGY  Dg Chest 2 View  Result Date: 05/29/2018 CLINICAL DATA:  Shortness of breath. Left leg swelling. EXAM:  CHEST - 2 VIEW COMPARISON:  Radiograph 08/28/2013 FINDINGS: The cardiomediastinal contours are normal. Subsegmental atelectasis or scarring in the lingula. Pulmonary vasculature is normal. No consolidation, pleural effusion, or pneumothorax. No acute osseous abnormalities are seen. IMPRESSION: 1. Subsegmental atelectasis or scarring in the lingula. 2. No evidence of pulmonary edema. Electronically Signed   By: Keith Rake M.D.   On: 05/29/2018 21:00   US Venous Img Lower  Left (dvt Study)  Result Date: 05/29/2018 CLINICAL DATA:  Left lower extremity swelling for 2 weeks EXAM: LEFT LOWER EXTREMITY VENOUS DOPPLER ULTRASOUND TECHNIQUE: Gray-scale sonography with graded compression, as well as color Doppler and duplex ultrasound were performed to evaluate the lower extremity deep venous systems from the level of the common femoral vein and including the common femoral, femoral, profunda femoral, popliteal and calf veins including the posterior tibial, peroneal and gastrocnemius veins when visible. The superficial great saphenous vein was also interrogated. Spectral Doppler was utilized to evaluate flow at rest and with distal augmentation maneuvers in the common femoral, femoral and popliteal veins. COMPARISON:  None. FINDINGS: Contralateral Common Femoral Vein: Respiratory phasicity is normal and symmetric with the symptomatic side. No evidence of thrombus. Normal compressibility. Common Femoral Vein: No evidence of thrombus. Normal compressibility, respiratory phasicity and response to augmentation. Saphenofemoral Junction: No evidence of thrombus. Normal compressibility and flow on color Doppler imaging. Profunda Femoral Vein: No evidence of thrombus. Normal compressibility and flow on color Doppler imaging. Femoral Vein: No evidence of thrombus. Normal compressibility, respiratory phasicity and response to augmentation. Popliteal Vein: No evidence of thrombus. Normal compressibility, respiratory phasicity  and response to augmentation. Calf Veins: No evidence of thrombus. Normal compressibility and flow on color Doppler imaging. Superficial Great Saphenous Vein: No evidence of thrombus. Normal compressibility. Venous Reflux:  None. Other Findings:  Calf edema is noted. IMPRESSION: No evidence of deep venous thrombosis. Electronically Signed   By: Inez Catalina M.D.   On: 05/29/2018 21:09    ____________________________________________   PROCEDURES  Procedure(s) performed:   Procedures  None  ____________________________________________   INITIAL IMPRESSION / ASSESSMENT AND PLAN / ED COURSE  Pertinent labs & imaging results that were available during my care of the patient were reviewed by me and considered in my medical decision making (see chart for details).   Patient presents to the emergency department for evaluation of left leg swelling and mild dyspnea.  She has been on 40 mg Lasix daily for 2 weeks with no significant improvement.  Plan for ultrasound to evaluate for DVT.  Extremely low suspicion for PE.  Chest x-ray pending. No hypoxemia. Normal ambulation in the ED without dyspnea.   CXR and [place] negative. No infiltrate. No DVT. Plan to increase lasix to 60 mg daily x 3 days and f/u with PCP. Discussed compression stockings and elevation at home. Discussed ED return precautions.  ____________________________________________  FINAL CLINICAL IMPRESSION(S) / ED  DIAGNOSES  Final diagnoses:  Left leg swelling    Note:  This document was prepared using Dragon voice recognition software and may include unintentional dictation errors.  Nanda Quinton, MD Emergency Medicine    Yasin Ducat, [facility], MD 05/29/18 [phone]

## 2018-05-30 ENCOUNTER — Other Ambulatory Visit: Payer: Self-pay | Admitting: Neurosurgery

## 2018-05-30 DIAGNOSIS — M4316 Spondylolisthesis, lumbar region: Secondary | ICD-10-CM

## 2018-06-04 ENCOUNTER — Ambulatory Visit: Payer: Medicare Other | Admitting: Podiatry

## 2018-06-21 ENCOUNTER — Other Ambulatory Visit: Payer: Self-pay

## 2018-06-21 ENCOUNTER — Encounter (HOSPITAL_BASED_OUTPATIENT_CLINIC_OR_DEPARTMENT_OTHER): Payer: Self-pay | Admitting: *Deleted

## 2018-06-21 ENCOUNTER — Emergency Department (HOSPITAL_BASED_OUTPATIENT_CLINIC_OR_DEPARTMENT_OTHER)
Admission: EM | Admit: 2018-06-21 | Discharge: 2018-06-21 | Disposition: A | Discharge status: Home / Self Care | Payer: Medicare Other | Attending: Emergency Medicine | Admitting: Emergency Medicine

## 2018-06-21 ENCOUNTER — Emergency Department (HOSPITAL_BASED_OUTPATIENT_CLINIC_OR_DEPARTMENT_OTHER): Payer: Medicare Other

## 2018-06-21 DIAGNOSIS — E039 Hypothyroidism, unspecified: Secondary | ICD-10-CM | POA: Insufficient documentation

## 2018-06-21 DIAGNOSIS — M7989 Other specified soft tissue disorders: Secondary | ICD-10-CM | POA: Diagnosis not present

## 2018-06-21 DIAGNOSIS — Z79899 Other long term (current) drug therapy: Secondary | ICD-10-CM | POA: Diagnosis not present

## 2018-06-21 DIAGNOSIS — R0602 Shortness of breath: Secondary | ICD-10-CM | POA: Diagnosis not present

## 2018-06-21 DIAGNOSIS — Z7982 Long term (current) use of aspirin: Secondary | ICD-10-CM | POA: Diagnosis not present

## 2018-06-21 DIAGNOSIS — Z87891 Personal history of nicotine dependence: Secondary | ICD-10-CM | POA: Diagnosis not present

## 2018-06-21 DIAGNOSIS — I10 Essential (primary) hypertension: Secondary | ICD-10-CM | POA: Diagnosis not present

## 2018-06-21 LAB — CBC WITH DIFFERENTIAL/PLATELET
Abs Immature Granulocytes: 0.01 10*3/uL (ref 0.00–0.07)
Basophils Absolute: 0 10*3/uL (ref 0.0–0.1)
Basophils Relative: 1 %
Eosinophils Absolute: 0.4 10*3/uL (ref 0.0–0.5)
Eosinophils Relative: 9 %
HCT: 38 % (ref 36.0–46.0)
Hemoglobin: 12.6 g/dL (ref 12.0–15.0)
Immature Granulocytes: 0 %
Lymphocytes Relative: 27 %
Lymphs Abs: 1.3 10*3/uL (ref 0.7–4.0)
MCH: 30.2 pg (ref 26.0–34.0)
MCHC: 33.2 g/dL (ref 30.0–36.0)
MCV: 91.1 fL (ref 80.0–100.0)
Monocytes Absolute: 0.4 10*3/uL (ref 0.1–1.0)
Monocytes Relative: 9 %
Neutro Abs: 2.6 10*3/uL (ref 1.7–7.7)
Neutrophils Relative %: 54 %
Platelets: 190 10*3/uL (ref 150–400)
RBC: 4.17 MIL/uL (ref 3.87–5.11)
RDW: 13.2 % (ref 11.5–15.5)
WBC: 4.7 10*3/uL (ref 4.0–10.5)
nRBC: 0 % (ref 0.0–0.2)

## 2018-06-21 LAB — COMPREHENSIVE METABOLIC PANEL
ALT: 52 U/L — ABNORMAL HIGH (ref 0–44)
AST: 36 U/L (ref 15–41)
Albumin: 4.1 g/dL (ref 3.5–5.0)
Alkaline Phosphatase: 50 U/L (ref 38–126)
Anion gap: 9 (ref 5–15)
BUN: 26 mg/dL — ABNORMAL HIGH (ref 8–23)
CO2: 26 mmol/L (ref 22–32)
Calcium: 9.7 mg/dL (ref 8.9–10.3)
Chloride: 103 mmol/L (ref 98–111)
Creatinine, Ser: 0.82 mg/dL (ref 0.44–1.00)
GFR calc Af Amer: >60 mL/min (ref >60)
GFR calc non Af Amer: >60 mL/min (ref >60)
Glucose, Bld: 103 mg/dL — ABNORMAL HIGH (ref 70–99)
Potassium: 3.3 mmol/L — ABNORMAL LOW (ref 3.5–5.1)
Sodium: 138 mmol/L (ref 135–145)
Total Bilirubin: 1.2 mg/dL (ref 0.3–1.2)
Total Protein: 7 g/dL (ref 6.5–8.1)

## 2018-06-21 LAB — BRAIN NATRIURETIC PEPTIDE: B Natriuretic Peptide: 30.7 pg/mL (ref 0.0–100.0)

## 2018-06-21 LAB — TROPONIN I: Troponin I: <0.03 ng/mL (ref <0.03)

## 2018-06-21 MED ORDER — FUROSEMIDE 20 MG PO TABS: 20.0000 mg | ORAL_TABLET | Freq: Every day | ORAL | 0 refills | Status: DC

## 2018-06-21 MED ORDER — FUROSEMIDE 10 MG/ML IJ SOLN
40.0000 mg | Freq: Once | INTRAMUSCULAR | Status: AC
  Administered 2018-06-21: 40 mg via INTRAVENOUS
  Filled 2018-06-21: qty 4

## 2018-06-21 NOTE — ED Provider Notes (Signed)
[facility] EMERGENCY DEPARTMENT Provider Note   CSN: [id number] Arrival date & time: 06/21/18  1937    History   Chief Complaint Chief Complaint  Patient presents with  . Leg Swelling    HPI Patricia Parks is a 73 y.o. female history of fibromyalgia, anxiety, depression, hyperlipidemia here presenting with shortness of breath, leg swelling.  Patient states that she has left leg swelling for the last several weeks.  She had a negative DVT study several weeks ago in the ER.  She also went to her doctor and some blood work (that is not available in our system).  Patient states that she is taking 40 mg of Lasix daily but her left leg is still swollen.  She has some subjective shortness of breath as well.  Denies any fevers or chills or recent sick contacts.  Denies any history of blood clots. Denies hx of heart failure.     The history is provided by the patient.    Past Medical History:  Diagnosis Date  . Anxiety   . Depression   . Fibromyalgia   . GERD (gastroesophageal reflux disease)   . Gout 04/2017  . Hyperlipidemia   . Hypertension   . Hypothyroidism       . Low kidney function    followed by Dr Alyson Ingles  . Spinal stenosis     Patient Active Problem List   Diagnosis Date Noted  . Pain in left knee 05/28/2017  . Elevated lipids 12/30/2014  . History of cervical dysplasia 12/30/2014  . Essential hypertension 09/10/2013  . Chest pain 08/28/2013    Past Surgical History:  Procedure Laterality Date  . CATARACT EXTRACTION W/ INTRAOCULAR LENS IMPLANT Bilateral 10/25/2015   Right eye was first.  Left eye done 10/17.  . CERVICAL BIOPSY  W/ LOOP ELECTRODE EXCISION  12/2008   hx CIN II     OB History    Gravida  2   Para  2   Term  2   Preterm      AB      Living  2     SAB      TAB      Ectopic      Multiple      Live Births               Home Medications    Prior to Admission medications   Medication Sig Start Date End  Date Taking? Authorizing Provider  aspirin EC 81 MG EC tablet Take 1 tablet (81 mg total) by mouth daily. 08/29/13   Hosie Poisson, MD  gemfibrozil (LOPID) 600 MG tablet Take 1 tablet (600 mg total) by mouth 2 (two) times daily before a meal. 08/29/13   Hosie Poisson, MD  levothyroxine (SYNTHROID, LEVOTHROID) 75 MCG tablet Take 150 mcg by mouth daily after breakfast.     [provider]  Lorcaserin HCl ER (BELVIQ XR) 20 MG TB24 Belviq XR 20 mg tablet,extended release  TAKE 1 TABLET EVERY DAY    [provider]  losartan (COZAAR) 25 MG tablet Take 25 mg by mouth daily. 10/09/17   [provider]  LYRICA 100 MG capsule TK 1 C PO BID 12/08/15   [provider]  LYRICA 300 MG capsule Take 300 mg by mouth 2 (two) times daily. 10/09/17   [provider]  metoprolol succinate (TOPROL-XL) 50 MG 24 hr tablet Take 50 mg by mouth daily. Take with or immediately following a meal.  [provider]  tobramycin (TOBREX) 0.3 % ophthalmic solution tobramycin 0.3 % eye drops  INSTILL 1 DROP INTO THE LEFT EYE 4XDAILY, 1 DAY BEFORE TREATMENT,DAY OF AND FULL DAY AFTER    [provider]  triamterene-hydrochlorothiazide (DYAZIDE) 37.5-25 MG per capsule Take 1 capsule by mouth every morning.    [provider]    Family History Family History  Problem Relation Age of Onset  . Osteoporosis Mother   . Hypertension Mother   . Uterine cancer Mother   . Heart disease Mother        Atrial fibrillation  . Breast cancer Sister   . Osteoporosis Sister   . Diabetes Father     Social History Social History   Tobacco Use  . Smoking status: Former Research scientist (life sciences)  . Smokeless tobacco: Never Used  . Tobacco comment: in the 70s  Substance Use Topics  . Alcohol use: Yes    Alcohol/week: 1.0 standard drinks    Types: 1 Standard drinks or equivalent per week    Comment: Occasional  . Drug use: No     Allergies   Patient has no known allergies.   Review  of Systems Review of Systems  Respiratory: Positive for shortness of breath.   Cardiovascular: Positive for leg swelling.  All other systems reviewed and are negative.    Physical Exam Updated Vital Signs BP (!) 152/92 (BP Location: Right Arm)   Pulse 70   Temp 98.4 F (36.9 C) (Oral)   Resp 18   Ht 5\' 5"  (1.651 m)   Wt 111.1 kg   LMP 03/05/2000   SpO2 99%   BMI 40.77 kg/m   Physical Exam Vitals signs and nursing note reviewed.  HENT:     Head: Normocephalic.     Nose: Nose normal.     Mouth/Throat:     Mouth: Mucous membranes are moist.  Eyes:     Extraocular Movements: Extraocular movements intact.     Pupils: Pupils are equal, round, and reactive to light.  Neck:     Musculoskeletal: Normal range of motion.  Cardiovascular:     Rate and Rhythm: Normal rate.     Pulses: Normal pulses.     Heart sounds: Normal heart sounds.  Pulmonary:     Comments: Diminished breath sounds bilateral bases  Abdominal:     Palpations: Abdomen is soft.  Musculoskeletal: Normal range of motion.     Comments: 1+ edema L leg, nl DP pulses, no calf tenderness. Neurovascular intact L lower extremity. Trace edema R leg   Skin:    General: Skin is warm.     Capillary Refill: Capillary refill takes less than 2 seconds.  Neurological:     General: No focal deficit present.     Mental Status: She is alert.  Psychiatric:        Mood and Affect: Mood normal.        Behavior: Behavior normal.      ED Treatments / Results  Labs (all labs ordered are listed, but only abnormal results are displayed) Labs Reviewed  COMPREHENSIVE METABOLIC PANEL - Abnormal; Notable for the following components:      Result Value   Potassium 3.3 (*)    Glucose, Bld 103 (*)    BUN 26 (*)    ALT 52 (*)    All other components within normal limits  CBC WITH DIFFERENTIAL/PLATELET  TROPONIN I  BRAIN NATRIURETIC PEPTIDE    EKG EKG Interpretation  Date/Time:  Saturday  June 21 2018 20:16:37 EDT  Ventricular Rate:  72 PR Interval:    QRS Duration: 100 QT Interval:  409 QTC Calculation: 448 R Axis:   -27 Text Interpretation:  Sinus rhythm Consider left atrial enlargement Borderline left axis deviation Baseline wander in lead(s) II III aVF No significant change since last tracing Confirmed by Wandra Arthurs [phone]) on 06/21/2018 8:51:17 PM   Radiology Dg Chest 2 View  Result Date: 06/21/2018 CLINICAL DATA:  73 year old female with bilateral lower extremity swelling for several weeks, worse on the left. EXAM: CHEST - 2 VIEW COMPARISON:  05/29/2018 chest radiographs. FINDINGS: Lung volumes and mediastinal contours are stable and within normal limits. Visualized tracheal air column is within normal limits. No pneumothorax, pulmonary edema, pleural effusion or confluent pulmonary opacity. Paucity of bowel gas in the upper abdomen. No acute osseous abnormality identified. IMPRESSION: No acute cardiopulmonary abnormality. Electronically Signed   By: Genevie Ann M.D.   On: 06/21/2018 20:59    Procedures Procedures (including critical care time)  Medications Ordered in ED Medications  furosemide (LASIX) injection 40 mg (40 mg Intravenous Given 06/21/18 2132)     Initial Impression / Assessment and Plan / ED Course  I have reviewed the triage vital signs and the nursing notes.  Pertinent labs & imaging results that were available during my care of the patient were reviewed by me and considered in my medical decision making (see chart for details).       Patricia Parks is a 73 y.o. female here with leg swelling, SOB. Consider new onset CHF vs renal failure from lasix. Will get labs, chemistry, CXR, BNP.  9:33 PM Cr normal. CXR clear. BNP normal. She is on lasix 40 mg daily. Given lasix 40 mg IV in the ED. Will add 20 mg lasix daily in addition to her 40 mg. Recommend leg elevation, compression stockings. Will have her follow up with PCP.     Final Clinical Impressions(s) / ED  Diagnoses   Final diagnoses:  None    ED Discharge Orders    None       Drenda Freeze, MD 06/21/18 2134

## 2018-06-21 NOTE — ED Triage Notes (Addendum)
Pt reports bilateral leg swelling since March, worse on left. She was seen in ED on the 25th. States Sx aren't better. She has had additional testing by her PCP since her ED visit. Pt states she gets SOB with exertion

## 2018-06-21 NOTE — Discharge Instructions (Signed)
Continue lasix 40 mg in the morning   Add lasix 20 mg in the evening for 5 days   Elevate your legs, continue compression stockings. Please walk around as much as you can   See your doctor  Consider follow up with cardiology if your swelling not improved   Return to ER if you have worse shortness of breath, leg swelling, fever

## 2018-07-01 ENCOUNTER — Ambulatory Visit (HOSPITAL_COMMUNITY): Admission: RE | Admit: 2018-07-01 | Payer: Medicare Other | Source: Ambulatory Visit

## 2018-07-08 ENCOUNTER — Ambulatory Visit: Payer: Medicare HMO | Admitting: Obstetrics & Gynecology

## 2018-07-17 ENCOUNTER — Encounter: Payer: Self-pay | Admitting: Cardiology

## 2018-07-17 ENCOUNTER — Other Ambulatory Visit: Payer: Self-pay

## 2018-07-17 ENCOUNTER — Ambulatory Visit: Payer: Medicare Other | Admitting: Cardiology

## 2018-07-17 VITALS — BP 149/75 | HR 75 | Temp 98.2°F | Ht 65.0 in | Wt 238.0 lb

## 2018-07-17 DIAGNOSIS — R072 Precordial pain: Secondary | ICD-10-CM

## 2018-07-17 DIAGNOSIS — I10 Essential (primary) hypertension: Secondary | ICD-10-CM

## 2018-07-17 DIAGNOSIS — R600 Localized edema: Secondary | ICD-10-CM | POA: Diagnosis not present

## 2018-07-17 DIAGNOSIS — R0609 Other forms of dyspnea: Secondary | ICD-10-CM | POA: Diagnosis not present

## 2018-07-17 DIAGNOSIS — R739 Hyperglycemia, unspecified: Secondary | ICD-10-CM

## 2018-07-17 DIAGNOSIS — I5032 Chronic diastolic (congestive) heart failure: Secondary | ICD-10-CM

## 2018-07-17 DIAGNOSIS — E782 Mixed hyperlipidemia: Secondary | ICD-10-CM

## 2018-07-17 DIAGNOSIS — Z01810 Encounter for preprocedural cardiovascular examination: Secondary | ICD-10-CM

## 2018-07-17 DIAGNOSIS — R0600 Dyspnea, unspecified: Secondary | ICD-10-CM

## 2018-07-17 MED ORDER — ROSUVASTATIN CALCIUM 10 MG PO TABS: 10.0000 mg | ORAL_TABLET | Freq: Every day | ORAL | 3 refills | Status: DC

## 2018-07-17 NOTE — Progress Notes (Signed)
Primary Physician/Referring:  Secundino Ginger, PA-C  [name], female    DOB: Jun 06, 1945, 73 y.o.   MRN: [id number]  Chief Complaint  Patient presents with  . Shortness of Breath    leg swelling/edema chest pain on and off   . Edema  . Chest Pain    [name]  is a 73 y.o. female  with fibromyalgia, anxiety, depression, hyperlipidemia, recently evaluated in ER on 06/21/2018 for shortness of breath and leg swelling.  Patient reportedly had been on Lasix for left leg swelling for the last several weeks.  Had had negative DVT study. Chest x-ray, BNP, kidney function, were normal.  Lasix was increased to 80 mg daily and she was advised on using compression stockings and leg elevation.  Due to persistent symptoms and also marked dyspnea on exertion, she wanted to be followed up and hence cardiology referral was made.  Patient states that her activity has been markedly limited but even with minimal activity she gets markedly dyspneic.  She has chronic back pain and is scheduled for back surgery.  She has occasional chest tightness in the middle of the chest, occurs at night and also sometimes during the daytime.  No definite relationship to activity, last a few minutes.  However her activity is limited due to above medical conditions.  She denies symptoms suggestive of OSA.  Past Medical History:  Diagnosis Date  . Anxiety   . Depression   . Fibromyalgia   . GERD (gastroesophageal reflux disease)   . Gout 04/2017  . Hyperlipidemia   . Hypertension   . Hypothyroidism       . Low kidney function    followed by Dr Alyson Ingles  . Spinal stenosis     Past Surgical History:  Procedure Laterality Date  . CATARACT EXTRACTION W/ INTRAOCULAR LENS IMPLANT Bilateral 10/25/2015   Right eye was first.  Left eye done 10/17.  . CERVICAL BIOPSY  W/ LOOP ELECTRODE EXCISION  12/2008   hx CIN II    Social History   Socioeconomic History  . Marital status: Married     Spouse name: Not on file  . Number of children: 2  . Years of education: Not on file  . Highest education level: Not on file  Occupational History    Comment: Retired  Scientific laboratory technician  . Financial resource strain: Not on file  . Food insecurity:    Worry: Not on file    Inability: Not on file  . Transportation needs:    Medical: Not on file    Non-medical: Not on file  Tobacco Use  . Smoking status: Former Research scientist (life sciences)  . Smokeless tobacco: Never Used  . Tobacco comment: in the 65s  Substance and Sexual Activity  . Alcohol use: Yes    Alcohol/week: 1.0 standard drinks    Types: 1 Standard drinks or equivalent per week    Comment: Occasional  . Drug use: No  . Sexual activity: Never  Lifestyle  . Physical activity:    Days per week: Not on file    Minutes per session: Not on file  . Stress: Not on file  Relationships  . Social connections:    Talks on phone: Not on file    Gets together: Not on file    Attends religious service: Not on file    Active member of club or organization: Not on file    Attends meetings of clubs or organizations: Not on file  Relationship status: Not on file  . Intimate partner violence:    Fear of current or ex partner: Not on file    Emotionally abused: Not on file    Physically abused: Not on file    Forced sexual activity: Not on file  Other Topics Concern  . Not on file  Social History Narrative  . Not on file    Current Outpatient Medications on File Prior to Visit  Medication Sig Dispense Refill  . allopurinol (ZYLOPRIM) 100 MG tablet Take 100 mg by mouth daily.    Marland Kitchen aspirin EC 81 MG EC tablet Take 1 tablet (81 mg total) by mouth daily. 30 tablet 1  . furosemide (LASIX) 20 MG tablet Take 1 tablet (20 mg total) by mouth daily. (Patient taking differently: Take 20 mg by mouth 2 (two) times daily. ) 5 tablet 0  . HYDROcodone-acetaminophen (NORCO) 10-325 MG tablet Take 0.5 tablets by mouth 2 (two) times a day.    . levothyroxine  (SYNTHROID, LEVOTHROID) 75 MCG tablet Take 150 mcg by mouth daily after breakfast.     . losartan (COZAAR) 25 MG tablet Take 25 mg by mouth daily.  1  . LYRICA 100 MG capsule Take 100 mg by mouth 3 (three) times daily.   0  . metoprolol succinate (TOPROL-XL) 50 MG 24 hr tablet Take 50 mg by mouth daily. Take with or immediately following a meal.    . triamterene-hydrochlorothiazide (DYAZIDE) 37.5-25 MG per capsule Take 1 capsule by mouth every morning.     No current facility-administered medications on file prior to visit.     Review of Systems  Constitution: Negative for chills, decreased appetite, malaise/fatigue and weight gain.  Cardiovascular: Positive for dyspnea on exertion and leg swelling. Negative for syncope.  Respiratory: Negative for sputum production and wheezing.   Endocrine: Negative for cold intolerance.  Hematologic/Lymphatic: Does not bruise/bleed easily.  Musculoskeletal: Positive for back pain (severe spinal stenosis) and joint pain. Negative for joint swelling.  Gastrointestinal: Negative for abdominal pain, anorexia, change in bowel habit, hematochezia and melena.  Neurological: Negative for headaches and light-headedness.  Psychiatric/Behavioral: Negative for depression and substance abuse.  All other systems reviewed and are negative.     Objective  Blood pressure (!) 149/75, pulse 75, temperature 98.2 F (36.8 C), height 5\' 5"  (1.651 m), weight 238 lb (108 kg), last menstrual period 03/05/2000, SpO2 95 %. Body mass index is 39.61 kg/m.    Physical Exam  Constitutional: She appears well-developed. No distress.  Morbidly obese  HENT:  Head: Atraumatic.  Eyes: Conjunctivae are normal.  Neck: Neck supple. No thyromegaly present.  Short neck and difficult to evaluate JVP  Cardiovascular: Normal rate, regular rhythm, normal heart sounds, intact distal pulses and normal pulses. Exam reveals no gallop.  No murmur heard. Pulses:      Carotid pulses are 2+ on  the right side and 2+ on the left side. Normal arterial examination.  There is venous engorgement noted in bilateral lower extremity with mild varicosities more prominent in the right lower extremity.  1+ Left pitting edema with right lower extremity 2-3+.  Pulmonary/Chest: Effort normal and breath sounds normal.  Abdominal: Soft. Bowel sounds are normal.  Obese. Pannus present  Musculoskeletal: Normal range of motion.        General: No edema.  Neurological: She is alert.  Skin: Skin is warm and dry.  Psychiatric: She has a normal mood and affect.   Radiology: No results found.  Laboratory examination:  Labs 06/09/2018: Potassium 4.3, serum glucose 122 mg, BUN 18, creatinine 1.2, AST 36, AST 54 both elevated.  Total cholesterol 222, triglycerides 215, HDL 54, LDL 127.  Uric acid 8.4, elevated.  ANA negative.  Rheumatoid factor negative.  CMP Latest Ref Rng & Units 06/21/2018 08/28/2013 08/28/2013  Glucose 70 - 99 mg/dL 103(H) - 100(H)  BUN 8 - 23 mg/dL 26(H) - 26(H)  Creatinine 0.44 - 1.00 mg/dL 0.82 1.01 1.03  Sodium 135 - 145 mmol/L 138 - 139  Potassium 3.5 - 5.1 mmol/L 3.3(L) - 3.8  Chloride 98 - 111 mmol/L 103 - 99  CO2 22 - 32 mmol/L 26 - 23  Calcium 8.9 - 10.3 mg/dL 9.7 - 9.9  Total Protein 6.5 - 8.1 g/dL 7.0 - 7.4  Total Bilirubin 0.3 - 1.2 mg/dL 1.2 - 0.8  Alkaline Phos 38 - 126 U/L 50 - 73  AST 15 - 41 U/L 36 - 25  ALT 0 - 44 U/L 52(H) - 31   CBC Latest Ref Rng & Units 06/21/2018 08/28/2013 08/28/2013  WBC 4.0 - 10.5 K/uL 4.7 5.6 6.7  Hemoglobin 12.0 - 15.0 g/dL 12.6 14.7 15.3(H)  Hematocrit 36.0 - 46.0 % 38.0 41.7 43.4  Platelets 150 - 400 K/uL 190 239 296   Lipid Panel     Component Value Date/Time   CHOL 219 (H) 08/29/2013 0102   TRIG 490 (H) 08/29/2013 0102   HDL 36 (L) 08/29/2013 0102   CHOLHDL 6.1 08/29/2013 0102   VLDL UNABLE TO CALCULATE IF TRIGLYCERIDE OVER 400 mg/dL 08/29/2013 0102   LDLCALC UNABLE TO CALCULATE IF TRIGLYCERIDE OVER 400 mg/dL 08/29/2013  0102   HEMOGLOBIN A1C No results found for: HGBA1C, MPG TSH No results for input(s): TSH in the last 8760 hours.  Cardiac Studies:  Lower extremity venous duplex 05/29/2018: No evidence of deep venous thrombosis. Left leg  Echocardiogram [facility] 06/17/2018: Normal LV systolic function, EF 67-20%.  Mild LVH.  No significant valvular abnormality.  Stress Echo 09/25/2013: Patient exercised on Bruce protocol for 7:50 minutes and achieved 10.8 METS.  Achieved 107% of MPHR. - Stress echocardiogram with no chest pain nondiagnostic ST changes; no stress-induced wall motion abnormalities; interpreted as normal stress echocardiogram.  Assessment   Edema of left lower leg due to peripheral venous insufficiency  Dyspnea on exertion  Chest pain, unspecified type - Plan: EKG 12-Lead  Mixed hyperlipidemia  Essential hypertension  Hyperglycemia  Preoperative cardiovascular examination Spinal stenosis Kary Kos, MD - To be scheduled after COVID restrictions   EKG 07/17/2018: Normal sinus rhythm at rate of 75 bpm, left atrial enlargement, normal axis.  Incomplete right bundle branch block.  No evidence of ischemia.  Recommendations:   Ms. Makynlee Kressin is a pleasant 73 year old Caucasian female who appears much younger than stated age referred to me for evaluation of leg edema and dyspnea on exertion.  She has multifactorial etiology for her leg edema including but not limited to morbid obesity, chronic diastolic dysfunction and diastolic heart failure, probably venous insufficiency as she does have mildly prominent bilateral lower extremity veins.  Straight leg edema is worse.  She also complains of chest pain, chest pain symptoms are very atypical occurring at night and sometimes in the daytime, may be related to GERD but cannot exclude underlying coronary artery disease.  Her last stress echocardiogram was more than 5 years ago.  With regard to mixed hyperlipidemia,  presently on Zetia low.  She had not tolerated gemfibrozil in the past, I started  her on Crestor 10 mg daily.  We'll repeat follow-up labs in 6 weeks. I have extensively discussed with her regarding weight loss.  She is also scheduled for back surgery by Dr. Kary Kos, surgery has been held due to covid 19.  She will certainly need  Cardiac risk stratification.  I reviewed her labs from her PCP.  She also has hyperglycemia.  Schedule for a Lexiscan Sestamibi stress test to evaluate for myocardial ischemia. Patient unable to do treadmill stress testing due to back pain and spinal stenosis and arthritis and dyspnea. Will schedule for an echocardiogram.  Lower Extremity Venous Duplex for venous insufficiency. You have leg swelling.  Most common reasons excessive salt intake, sedentary lifestyle, sitting on chair for prolonged periods, pains in the legs not working properly.  He would find improvement in symptoms of leg discomfort by wearing support stockings, stockings should be worn within the 1st hour of waking up and has to be taken off during the night.  Other options including procedure on the legs will be discussed if necessary during her office visit. Office visit following the work-up/investigations.   Adrian Prows, MD, [facility] 07/17/2018, 3:29 PM [facility] [place] Pager: [phone] Office: [phone] If no answer Cell [phone]

## 2018-07-19 ENCOUNTER — Encounter: Payer: Self-pay | Admitting: Cardiology

## 2018-07-25 ENCOUNTER — Telehealth: Payer: Medicare Other | Admitting: Cardiovascular Disease

## 2018-07-29 ENCOUNTER — Ambulatory Visit (HOSPITAL_COMMUNITY): Payer: Medicare Other

## 2018-08-06 ENCOUNTER — Ambulatory Visit (INDEPENDENT_AMBULATORY_CARE_PROVIDER_SITE_OTHER): Payer: Medicare Other

## 2018-08-06 ENCOUNTER — Other Ambulatory Visit: Payer: Self-pay

## 2018-08-06 DIAGNOSIS — R072 Precordial pain: Secondary | ICD-10-CM

## 2018-08-06 DIAGNOSIS — R0609 Other forms of dyspnea: Secondary | ICD-10-CM | POA: Diagnosis not present

## 2018-08-06 DIAGNOSIS — R0600 Dyspnea, unspecified: Secondary | ICD-10-CM

## 2018-08-07 NOTE — Progress Notes (Signed)
Pt aware.

## 2018-08-18 ENCOUNTER — Other Ambulatory Visit: Payer: Self-pay

## 2018-08-18 ENCOUNTER — Ambulatory Visit (HOSPITAL_COMMUNITY)
Admission: RE | Admit: 2018-08-18 | Discharge: 2018-08-18 | Disposition: A | Discharge status: Home / Self Care | Payer: Medicare Other | Source: Ambulatory Visit | Attending: Neurosurgery | Admitting: Neurosurgery

## 2018-08-18 DIAGNOSIS — M4316 Spondylolisthesis, lumbar region: Secondary | ICD-10-CM | POA: Diagnosis not present

## 2018-08-19 ENCOUNTER — Other Ambulatory Visit (HOSPITAL_COMMUNITY): Payer: Medicare Other

## 2018-08-26 ENCOUNTER — Other Ambulatory Visit: Payer: Self-pay | Admitting: Neurosurgery

## 2018-08-26 DIAGNOSIS — M48062 Spinal stenosis, lumbar region with neurogenic claudication: Secondary | ICD-10-CM

## 2018-08-27 ENCOUNTER — Other Ambulatory Visit: Payer: Self-pay | Admitting: Endocrinology

## 2018-08-27 DIAGNOSIS — E049 Nontoxic goiter, unspecified: Secondary | ICD-10-CM

## 2018-09-04 ENCOUNTER — Ambulatory Visit
Admission: RE | Admit: 2018-09-04 | Discharge: 2018-09-04 | Disposition: A | Discharge status: Home / Self Care | Payer: Medicare Other | Source: Ambulatory Visit | Attending: Endocrinology | Admitting: Endocrinology

## 2018-09-04 DIAGNOSIS — E049 Nontoxic goiter, unspecified: Secondary | ICD-10-CM

## 2018-09-10 NOTE — Progress Notes (Signed)
73 y.o. G66P2002 Married White or Caucasian female here for annual exam.  Doing well.  Trying to stay safe.  Did not do any traveling.    Denies vaginal bleeding.    Is going to have back surgery.  She needs a BMD.  This was ordered at [facility] but they have not called her to schedule.  Prior one was done at [facility].  She is having this with Dr. Saintclair Halsted.    Had cholesterol recheck and metabolic panel done yesterday.    Saw Dr. Chalmers Cater on June.  Blood work done for diabetes and thyroid.    Patient's last menstrual period was 03/05/2000.          Sexually active: No.  The current method of family planning is post menopausal status.    Exercising: Yes.    walking Smoker:  Former smoker   Health Maintenance: Pap:  04-24-17 negative           12-30-14 negative, HR HPV negative  History of abnormal Pap:  yes MMG:  05-16-2018 density B/BIRADS 1 negative  Colonoscopy:  12-2015 f/u 10 years  BMD:   04-27-2016 osteopenia  TDaP:  2010 Pneumonia vaccine(s): 01-03-18 Shingrix:   2019 x2 Hep C testing: 01-02-16 negative  Screening Labs: PCP   reports that she has quit smoking. She has never used smokeless tobacco. She reports current alcohol use of about 1.0 standard drinks of alcohol per week. She reports that she does not use drugs.  Past Medical History:  Diagnosis Date  . Anxiety   . Depression   . Fibromyalgia   . GERD (gastroesophageal reflux disease)   . Gout 04/2017  . Hyperlipidemia   . Hypertension   . Hypothyroidism       . Low kidney function    followed by Dr Alyson Ingles  . Spinal stenosis     Past Surgical History:  Procedure Laterality Date  . CATARACT EXTRACTION W/ INTRAOCULAR LENS IMPLANT Bilateral 10/25/2015   Right eye was first.  Left eye done 10/17.  . CERVICAL BIOPSY  W/ LOOP ELECTRODE EXCISION  12/2008   hx CIN II    Current Outpatient Medications  Medication Sig Dispense Refill  . allopurinol (ZYLOPRIM) 100 MG tablet Take 100 mg by mouth daily.    Marland Kitchen  aspirin EC 81 MG EC tablet Take 1 tablet (81 mg total) by mouth daily. 30 tablet 1  . HYDROcodone-acetaminophen (NORCO) 10-325 MG tablet Take 0.5 tablets by mouth 2 (two) times a day.    . levothyroxine (SYNTHROID, LEVOTHROID) 75 MCG tablet Take 150 mcg by mouth daily after breakfast.     . losartan (COZAAR) 25 MG tablet Take 25 mg by mouth daily.  1  . LYRICA 100 MG capsule Take 100 mg by mouth 3 (three) times daily.   0  . metoprolol succinate (TOPROL-XL) 50 MG 24 hr tablet Take 50 mg by mouth daily. Take with or immediately following a meal.    . rosuvastatin (CRESTOR) 10 MG tablet Take 1 tablet (10 mg total) by mouth daily. 30 tablet 3  . triamterene-hydrochlorothiazide (DYAZIDE) 37.5-25 MG per capsule Take 1 capsule by mouth every morning.     No current facility-administered medications for this visit.     Family History  Problem Relation Age of Onset  . Osteoporosis Mother   . Hypertension Mother   . Uterine cancer Mother   . Heart disease Mother        Atrial fibrillation  . Breast cancer  Sister   . Osteoporosis Sister   . Diabetes Sister   . Diabetes Father   . Heart disease Sister     Review of Systems  All other systems reviewed and are negative.   Exam:   BP (!) 144/72 (BP Location: Right Arm, Patient Position: Sitting, Cuff Size: Large)   Pulse 80   Temp (!) 97.4 F (36.3 C) (Temporal)   Resp 18   Ht 5\' 5"  (1.651 m)   Wt 226 lb 1.6 oz (102.6 kg)   LMP 03/05/2000   BMI 37.63 kg/m    Height: 5\' 5"  (165.1 cm)  Ht Readings from Last 3 Encounters:  09/12/18 5\' 5"  (1.651 m)  07/17/18 5\' 5"  (1.651 m)  06/21/18 5\' 5"  (1.651 m)    General appearance: alert, cooperative and appears stated age Head: Normocephalic, without obvious abnormality, atraumatic Neck: no adenopathy, supple, symmetrical, trachea midline and thyroid normal to inspection and palpation Lungs: clear to auscultation bilaterally Breasts: normal appearance, no masses or tenderness Heart: regular  rate and rhythm Abdomen: soft, non-tender; bowel sounds normal; no masses,  no organomegaly Extremities: extremities normal, atraumatic, no cyanosis or edema Skin: Skin color, texture, turgor normal. No rashes or lesions Lymph nodes: Cervical, supraclavicular, and axillary nodes normal. No abnormal inguinal nodes palpated Neurologic: Grossly normal   Pelvic: External genitalia:  no lesions              Urethra:  normal appearing urethra with no masses, tenderness or lesions              Bartholins and Skenes: normal                 Vagina: normal appearing vagina with normal color and discharge, no lesions              Cervix: no lesions              Pap taken: No. Bimanual Exam:  Uterus:  normal size, contour, position, consistency, mobility, non-tender              Adnexa: normal adnexa and no mass, fullness, tenderness               Rectovaginal: Confirms               Anus:  normal sphincter tone, no lesions  Chaperone was present for exam.  A:  Well Woman with normal exam PMP, no HRT H/o CIN 2, s/p LEEP 2010.  Neg pap 2019. Hypertension Elevated lipids, on medication. Hypothyroidism, followed by Dr. Chalmers Cater Thyroid nodules.  Last Ultrasound 09/04/2018 Osteopenia  P:   Mammogram guidelines reviewed.  Doing yearly. pap smear neg 2019.  Not indicated today. Lab work UTD Vaccines are UTD except Tdap.  This will be updated today. Order for BMD placed today.  We will need to send this to [facility]. Return annually or prn

## 2018-09-12 ENCOUNTER — Other Ambulatory Visit: Payer: Self-pay

## 2018-09-12 ENCOUNTER — Ambulatory Visit (INDEPENDENT_AMBULATORY_CARE_PROVIDER_SITE_OTHER): Payer: Medicare Other | Admitting: Obstetrics & Gynecology

## 2018-09-12 ENCOUNTER — Encounter: Payer: Self-pay | Admitting: Obstetrics & Gynecology

## 2018-09-12 VITALS — BP 144/72 | HR 80 | Temp 97.4°F | Resp 18 | Ht 65.0 in | Wt 226.1 lb

## 2018-09-12 DIAGNOSIS — M8580 Other specified disorders of bone density and structure, unspecified site: Secondary | ICD-10-CM | POA: Diagnosis not present

## 2018-09-12 DIAGNOSIS — Z23 Encounter for immunization: Secondary | ICD-10-CM | POA: Diagnosis not present

## 2018-09-12 DIAGNOSIS — Z01419 Encounter for gynecological examination (general) (routine) without abnormal findings: Secondary | ICD-10-CM

## 2018-09-12 LAB — CMP14+EGFR
ALT: 43 IU/L — ABNORMAL HIGH (ref 0–32)
AST: 35 IU/L (ref 0–40)
Albumin/Globulin Ratio: 2.4 — ABNORMAL HIGH (ref 1.2–2.2)
Albumin: 4.4 g/dL (ref 3.7–4.7)
Alkaline Phosphatase: 56 IU/L (ref 39–117)
BUN/Creatinine Ratio: 21 (ref 12–28)
BUN: 20 mg/dL (ref 8–27)
Bilirubin Total: 1.1 mg/dL (ref 0.0–1.2)
CO2: 24 mmol/L (ref 20–29)
Calcium: 9.9 mg/dL (ref 8.7–10.3)
Chloride: 101 mmol/L (ref 96–106)
Creatinine, Ser: 0.95 mg/dL (ref 0.57–1.00)
GFR calc Af Amer: 70 mL/min/{1.73_m2} (ref >59)
GFR calc non Af Amer: 60 mL/min/{1.73_m2} (ref >59)
Globulin, Total: 1.8 g/dL (ref 1.5–4.5)
Glucose: 121 mg/dL — ABNORMAL HIGH (ref 65–99)
Potassium: 3.8 mmol/L (ref 3.5–5.2)
Sodium: 140 mmol/L (ref 134–144)
Total Protein: 6.2 g/dL (ref 6.0–8.5)

## 2018-09-12 LAB — LIPID PANEL WITH LDL/HDL RATIO
Cholesterol, Total: 119 mg/dL (ref 100–199)
HDL: 46 mg/dL (ref >39)
LDL Calculated: 55 mg/dL (ref 0–99)
LDl/HDL Ratio: 1.2 ratio (ref 0.0–3.2)
Triglycerides: 90 mg/dL (ref 0–149)
VLDL Cholesterol Cal: 18 mg/dL (ref 5–40)

## 2018-09-17 ENCOUNTER — Encounter: Payer: Self-pay | Admitting: Cardiology

## 2018-09-18 ENCOUNTER — Ambulatory Visit
Admission: RE | Admit: 2018-09-18 | Discharge: 2018-09-18 | Disposition: A | Discharge status: Home / Self Care | Payer: Medicare Other | Source: Ambulatory Visit | Attending: Obstetrics & Gynecology | Admitting: Obstetrics & Gynecology

## 2018-09-18 DIAGNOSIS — M8580 Other specified disorders of bone density and structure, unspecified site: Secondary | ICD-10-CM

## 2018-09-19 ENCOUNTER — Encounter: Payer: Self-pay | Admitting: Cardiology

## 2018-09-19 ENCOUNTER — Ambulatory Visit (INDEPENDENT_AMBULATORY_CARE_PROVIDER_SITE_OTHER): Payer: Medicare Other | Admitting: Cardiology

## 2018-09-19 ENCOUNTER — Other Ambulatory Visit: Payer: Self-pay

## 2018-09-19 VITALS — BP 145/83 | HR 72 | Ht 65.0 in | Wt 223.0 lb

## 2018-09-19 DIAGNOSIS — I5032 Chronic diastolic (congestive) heart failure: Secondary | ICD-10-CM

## 2018-09-19 DIAGNOSIS — I10 Essential (primary) hypertension: Secondary | ICD-10-CM

## 2018-09-19 DIAGNOSIS — R600 Localized edema: Secondary | ICD-10-CM

## 2018-09-19 DIAGNOSIS — Z6837 Body mass index (BMI) 37.0-37.9, adult: Secondary | ICD-10-CM

## 2018-09-19 DIAGNOSIS — E783 Hyperchylomicronemia: Secondary | ICD-10-CM

## 2018-09-19 MED ORDER — LOSARTAN POTASSIUM 100 MG PO TABS: 100.0000 mg | ORAL_TABLET | Freq: Every day | ORAL | 1 refills | Status: DC

## 2018-09-19 MED ORDER — PHENTERMINE HCL 30 MG PO CAPS: 30.0000 mg | ORAL_CAPSULE | ORAL | 1 refills | Status: DC

## 2018-09-19 MED ORDER — CHLORTHALIDONE 25 MG PO TABS: 12.5000 mg | ORAL_TABLET | ORAL | 2 refills | Status: DC

## 2018-09-19 NOTE — Progress Notes (Signed)
Virtual Visit via Telephone Note: Patient unable to use video assisted device.  This visit type was conducted due to national recommendations for restrictions regarding the COVID-19 Pandemic (e.g. social distancing).  This format is felt to be most appropriate for this patient at this time.  All issues noted in this document were discussed and addressed.  No physical exam was performed.  The patient has consented to conduct a Telehealth visit and understands insurance will be billed.   I connected with@, on 09/19/18 at  by TELEPHONE and verified that I am speaking with the correct person using two identifiers.   I discussed the limitations of evaluation and management by telemedicine and the availability of in person appointments. The patient expressed understanding and agreed to proceed.   I have discussed with patient regarding the safety during COVID Pandemic and steps and precautions to be taken including social distancing, frequent hand wash and use of detergent soap, gels with the patient. I asked the patient to avoid touching mouth, nose, eyes, ears with the hands. I encouraged regular walking around the neighborhood and exercise and regular diet, as long as social distancing can be maintained.  Primary Physician/Referring:  Secundino Ginger, PA-C  Patient ID: Quentin Mulling, female    DOB: 1945/11/14, 73 y.o.   MRN: [id number]  Chief Complaint  Patient presents with  . Shortness of Breath  . Edema  . Follow-up    52mo   HPI: EMellonie Guess is a 73y.o. female  with fibromyalgia, anxiety, depression, hyperlipidemia, recently evaluated in ER on 06/21/2018 for shortness of breath and leg swelling.  Patient reportedly had been on Lasix for left leg swelling for the last several weeks.  Had had negative DVT study. Chest x-ray, BNP, kidney function, were normal.  Lasix was increased to 80 mg daily and she was advised on using compression stockings and leg elevation.  Due to  persistent symptoms and also marked dyspnea on exertion, she wanted to be followed up and hence cardiology referral was made. She has chronic back pain and is scheduled for back surgery.  I had seen her 4 weeks ago and underwent nuclear stress test and presents for follow-up.  She has made lifestyle changes and has lost about 15 pounds in weight.  Her leg edema has improved, dyspnea has improved and she has not had any further chest discomforts.  She appears very motivated.  Past Medical History:  Diagnosis Date  . Anxiety   . Depression   . Fibromyalgia   . GERD (gastroesophageal reflux disease)   . Gout 04/2017  . Hyperlipidemia   . Hypertension   . Hypothyroidism       . Low kidney function    followed by Dr RAlyson Ingles . Spinal stenosis     Past Surgical History:  Procedure Laterality Date  . CATARACT EXTRACTION W/ INTRAOCULAR LENS IMPLANT Bilateral 10/25/2015   Right eye was first.  Left eye done 10/17.  . CERVICAL BIOPSY  W/ LOOP ELECTRODE EXCISION  12/2008   hx CIN II    Social History   Socioeconomic History  . Marital status: Married    Spouse name: Not on file  . Number of children: 2  . Years of education: Not on file  . Highest education level: Not on file  Occupational History    Comment: Retired  SScientific laboratory technician . Financial resource strain: Not on file  . Food insecurity    Worry: Not on  file    Inability: Not on file  . Transportation needs    Medical: Not on file    Non-medical: Not on file  Tobacco Use  . Smoking status: Former Smoker    Years: 5.00    Types: Cigarettes    Quit date: 1975    Years since quitting: 45.5  . Smokeless tobacco: Never Used  . Tobacco comment: in the 85s  Substance and Sexual Activity  . Alcohol use: Yes    Alcohol/week: 1.0 standard drinks    Types: 1 Standard drinks or equivalent per week    Comment: Occasional  . Drug use: No  . Sexual activity: Never  Lifestyle  . Physical activity    Days per week: Not on file     Minutes per session: Not on file  . Stress: Not on file  Relationships  . Social Herbalist on phone: Not on file    Gets together: Not on file    Attends religious service: Not on file    Active member of club or organization: Not on file    Attends meetings of clubs or organizations: Not on file    Relationship status: Not on file  . Intimate partner violence    Fear of current or ex partner: Not on file    Emotionally abused: Not on file    Physically abused: Not on file    Forced sexual activity: Not on file  Other Topics Concern  . Not on file  Social History Narrative  . Not on file    Current Outpatient Medications on File Prior to Visit  Medication Sig Dispense Refill  . allopurinol (ZYLOPRIM) 100 MG tablet Take 100 mg by mouth daily.    Marland Kitchen aspirin EC 81 MG EC tablet Take 1 tablet (81 mg total) by mouth daily. 30 tablet 1  . HYDROcodone-acetaminophen (NORCO) 10-325 MG tablet Take 0.5 tablets by mouth 2 (two) times a day.    . levothyroxine (SYNTHROID) 75 MCG tablet Take 75 mcg by mouth daily before breakfast.    . metoprolol succinate (TOPROL-XL) 50 MG 24 hr tablet Take 50 mg by mouth daily. Take with or immediately following a meal.    . pregabalin (LYRICA) 100 MG capsule Take 100 mg by mouth 2 (two) times daily.    . rosuvastatin (CRESTOR) 10 MG tablet Take 1 tablet (10 mg total) by mouth daily. 30 tablet 3   No current facility-administered medications on file prior to visit.     Review of Systems  Constitution: Negative for chills, decreased appetite, malaise/fatigue and weight gain.  Cardiovascular: Positive for dyspnea on exertion and leg swelling. Negative for syncope.  Respiratory: Negative for sputum production and wheezing.   Endocrine: Negative for cold intolerance.  Hematologic/Lymphatic: Does not bruise/bleed easily.  Musculoskeletal: Positive for back pain (severe spinal stenosis) and joint pain. Negative for joint swelling.  Gastrointestinal:  Negative for abdominal pain, anorexia, change in bowel habit, hematochezia and melena.  Neurological: Negative for headaches and light-headedness.  Psychiatric/Behavioral: Negative for depression and substance abuse.  All other systems reviewed and are negative.     Objective  Blood pressure (!) 145/83, pulse 72, height _0  (1.651 m), weight 223 lb (101.2 kg), last menstrual period 03/05/2000. Body mass index is 37.11 kg/m.   Physical exam not performed or limited due to virtual visit.   Please see exam details from prior visit is as below.  Physical Exam  Constitutional: She appears well-developed. No  distress.  Morbidly obese  HENT:  Head: Atraumatic.  Eyes: Conjunctivae are normal.  Neck: Neck supple. No thyromegaly present.  Short neck and difficult to evaluate JVP  Cardiovascular: Normal rate, regular rhythm, normal heart sounds, intact distal pulses and normal pulses. Exam reveals no gallop.  No murmur heard. Pulses:      Carotid pulses are 2+ on the right side and 2+ on the left side. Normal arterial examination.  There is venous engorgement noted in bilateral lower extremity with mild varicosities more prominent in the right lower extremity.  1+ Left pitting edema with right lower extremity 2-3+.  Pulmonary/Chest: Effort normal and breath sounds normal.  Abdominal: Soft. Bowel sounds are normal.  Obese. Pannus present  Musculoskeletal: Normal range of motion.        General: No edema.  Neurological: She is alert.  Skin: Skin is warm and dry.  Psychiatric: She has a normal mood and affect.   Laboratory examination:   Labs 06/09/2018: Potassium 4.3, serum glucose 122 mg, BUN 18, creatinine 1.2, AST 36, AST 54 both elevated.  Total cholesterol 222, triglycerides 215, HDL 54, LDL 127.  Uric acid 8.4, elevated.  ANA negative.  Rheumatoid factor negative.  CMP Latest Ref Rng & Units 09/11/2018 06/21/2018 08/28/2013  Glucose 65 - 99 mg/dL 121(H) 103(H) -  BUN 8 - 27 mg/dL 20  26(H) -  Creatinine 0.57 - 1.00 mg/dL 0.95 0.82 1.01  Sodium 134 - 144 mmol/L 140 138 -  Potassium 3.5 - 5.2 mmol/L 3.8 3.3(L) -  Chloride 96 - 106 mmol/L 101 103 -  CO2 20 - 29 mmol/L 24 26 -  Calcium 8.7 - 10.3 mg/dL 9.9 9.7 -  Total Protein 6.0 - 8.5 g/dL 6.2 7.0 -  Total Bilirubin 0.0 - 1.2 mg/dL 1.1 1.2 -  Alkaline Phos 39 - 117 IU/L 56 50 -  AST 0 - 40 IU/L 35 36 -  ALT 0 - 32 IU/L 43(H) 52(H) -   CBC Latest Ref Rng & Units 06/21/2018 08/28/2013 08/28/2013  WBC 4.0 - 10.5 K/uL 4.7 5.6 6.7  Hemoglobin 12.0 - 15.0 g/dL 12.6 14.7 15.3(H)  Hematocrit 36.0 - 46.0 % 38.0 41.7 43.4  Platelets 150 - 400 K/uL 190 239 296   Lipid Panel     Component Value Date/Time   CHOL 119 09/11/2018 1115   TRIG 90 09/11/2018 1115   HDL 46 09/11/2018 1115   CHOLHDL 6.1 08/29/2013 0102   VLDL UNABLE TO CALCULATE IF TRIGLYCERIDE OVER 400 mg/dL 08/29/2013 0102   LDLCALC 55 09/11/2018 1115   HEMOGLOBIN A1C No results found for: HGBA1C, MPG TSH No results for input(s): TSH in the last 8760 hours.  Cardiac Studies:  Lower extremity venous duplex 05/29/2018: No evidence of deep venous thrombosis. Left leg  Echocardiogram [facility] 06/17/2018: Normal LV systolic function, EF 96-75%.  Mild LVH.  No significant valvular abnormality.  Stress Echo 09/25/2013: Patient exercised on Bruce protocol for 7:50 minutes and achieved 10.8 METS.  Achieved 107% of MPHR. - Stress echocardiogram with no chest pain nondiagnostic ST changes; no stress-induced wall motion abnormalities; interpreted as normal stress echocardiogram.  Lexiscan Myoview Stress Test 08/06/2018: Nondiagnostic ECG stress due to pharmacologic stress protocol. Normal myocardial perfusion.  Stress LV EF: 72%.  Low risk study.  Assessment     ICD-10-CM   1. Bilateral leg edema  R60.0   2. Chronic diastolic heart failure (HCC)  I50.32   3. Essential hypertension  I10 losartan (COZAAR) 100 MG  tablet    chlorthalidone  (HYGROTON) 25 MG tablet    CMP14+EGFR  4. Class 2 severe obesity due to excess calories with serious comorbidity and body mass index (BMI) of 37.0 to 37.9 in adult (HCC)  E66.01 phentermine 30 MG capsule   Z68.37   5. Exogenous hypertriglyceridemia  E78.3 Lipid Panel With LDL/HDL Ratio    LDL cholesterol, direct    EKG 07/17/2018: Normal sinus rhythm at rate of 75 bpm, left atrial enlargement, normal axis.  Incomplete right bundle branch block.  No evidence of ischemia.  Recommendations:   Ms. Jazzmyn Filion is a pleasant 73 year old Caucasian female who appears much younger than stated age referred to me for evaluation of leg edema and dyspnea on exertion.  I had seen her about 4 weeks ago and she has made significant lifestyle change and has lost 15 pounds in weight with improvement in leg edema and dyspnea.  Given her positive reinforcement to continue same.  Nuclear stress test is low risk.  If she indeed decides to go for back surgery she can be taken up for surgery with low risk.  Weight loss management is important, as she is motivated, I have started her on phentermine 30 mg in the morning daily.  I would like to see her back in 2 months for follow-up.  Blood pressure is elevated, I would increase her losartan from 55m to 100 mg daily and add chlorthalidone 25 mg 1/2 tablet in the morning and discontinue Maxide.  She will need CMP and lipid profile testing to evaluate her exogenous hypertriglyceridemia and will also obtain direct LDL.  I would like to see her back in 6 weeks for follow-up.  With regard to leg edema I had ordered venous insufficiency study but in view of improvement in her leg edema with weight loss I would hold off on this.  JAdrian Prows MD, FM[facility]7/17/2020, 11:42 AM [facility]. PAltamontPager: [phone] Office: [phone] no answer Cell [phone]

## 2018-10-09 ENCOUNTER — Other Ambulatory Visit: Payer: Self-pay | Admitting: Cardiology

## 2018-10-09 DIAGNOSIS — E782 Mixed hyperlipidemia: Secondary | ICD-10-CM

## 2018-10-13 ENCOUNTER — Encounter: Payer: Self-pay | Admitting: Cardiology

## 2018-10-17 HISTORY — PX: OTHER SURGICAL HISTORY: SHX169

## 2018-10-23 LAB — CMP14+EGFR
ALT: 43 IU/L — ABNORMAL HIGH (ref 0–32)
AST: 32 IU/L (ref 0–40)
Albumin/Globulin Ratio: 2.1 (ref 1.2–2.2)
Albumin: 4.6 g/dL (ref 3.7–4.7)
Alkaline Phosphatase: 61 IU/L (ref 39–117)
BUN/Creatinine Ratio: 19 (ref 12–28)
BUN: 19 mg/dL (ref 8–27)
Bilirubin Total: 1.3 mg/dL — ABNORMAL HIGH (ref 0.0–1.2)
CO2: 22 mmol/L (ref 20–29)
Calcium: 9.8 mg/dL (ref 8.7–10.3)
Chloride: 99 mmol/L (ref 96–106)
Creatinine, Ser: 1.01 mg/dL — ABNORMAL HIGH (ref 0.57–1.00)
GFR calc Af Amer: 65 mL/min/{1.73_m2} (ref >59)
GFR calc non Af Amer: 56 mL/min/{1.73_m2} — ABNORMAL LOW (ref >59)
Globulin, Total: 2.2 g/dL (ref 1.5–4.5)
Glucose: 107 mg/dL — ABNORMAL HIGH (ref 65–99)
Potassium: 3.8 mmol/L (ref 3.5–5.2)
Sodium: 138 mmol/L (ref 134–144)
Total Protein: 6.8 g/dL (ref 6.0–8.5)

## 2018-10-23 LAB — LIPID PANEL WITH LDL/HDL RATIO
Cholesterol, Total: 121 mg/dL (ref 100–199)
HDL: 45 mg/dL (ref >39)
LDL Calculated: 55 mg/dL (ref 0–99)
LDl/HDL Ratio: 1.2 ratio (ref 0.0–3.2)
Triglycerides: 104 mg/dL (ref 0–149)
VLDL Cholesterol Cal: 21 mg/dL (ref 5–40)

## 2018-10-23 LAB — LDL CHOLESTEROL, DIRECT: LDL Direct: 58 mg/dL (ref 0–99)

## 2018-11-03 ENCOUNTER — Other Ambulatory Visit: Payer: Self-pay

## 2018-11-03 ENCOUNTER — Ambulatory Visit (INDEPENDENT_AMBULATORY_CARE_PROVIDER_SITE_OTHER): Payer: Medicare Other | Admitting: Cardiology

## 2018-11-03 ENCOUNTER — Encounter: Payer: Self-pay | Admitting: Cardiology

## 2018-11-03 VITALS — BP 126/75 | HR 6 | Temp 96.7°F | Ht 65.0 in | Wt 214.4 lb

## 2018-11-03 DIAGNOSIS — E783 Hyperchylomicronemia: Secondary | ICD-10-CM | POA: Diagnosis not present

## 2018-11-03 DIAGNOSIS — R0609 Other forms of dyspnea: Secondary | ICD-10-CM | POA: Diagnosis not present

## 2018-11-03 DIAGNOSIS — Z6837 Body mass index (BMI) 37.0-37.9, adult: Secondary | ICD-10-CM

## 2018-11-03 DIAGNOSIS — R600 Localized edema: Secondary | ICD-10-CM

## 2018-11-03 DIAGNOSIS — R0600 Dyspnea, unspecified: Secondary | ICD-10-CM

## 2018-11-03 MED ORDER — PHENTERMINE HCL 30 MG PO CAPS: 30.0000 mg | ORAL_CAPSULE | ORAL | 0 refills | Status: DC

## 2018-11-03 NOTE — Progress Notes (Signed)
Primary Physician/Referring:  Secundino Ginger, PA-C  Patient ID: Quentin Mulling, female    DOB: 10-22-1945, 73 y.o.   MRN: [id number]  Chief Complaint  Patient presents with  . Hyperlipidemia  . Hypertension  . Obesity  . Results    Labs  . Follow-up    HPI: Teneeshia Overberg  is a 73 y.o. female  with fibromyalgia, anxiety, depression, hyperlipidemia, recently evaluated in ER on 06/21/2018 for shortness of breath and leg swelling.  She presents follow up evaluation of leg edema and dyspnea on exertion.   She has done remarkable job with regard Weight loss, she is also tolerating statins well, so for she has lost about 15-20 pounds in weight in the past 2 months.  She is also tolerating phentermine without any side effects, blood pressure is well controlled. Her leg edema has resolved, dyspnea has improved and she has not had any further chest discomforts.  She appears very motivated.  She has chronic back pain and is scheduled for back surgery. Even this has improved.  Past Medical History:  Diagnosis Date  . Anxiety   . Depression   . Fibromyalgia   . GERD (gastroesophageal reflux disease)   . Gout 04/2017  . Hyperlipidemia   . Hypertension   . Hypothyroidism       . Low kidney function    followed by Dr Alyson Ingles  . Spinal stenosis     Past Surgical History:  Procedure Laterality Date  . Carpel Tunnel Surgery Right 10/17/2018  . CATARACT EXTRACTION W/ INTRAOCULAR LENS IMPLANT Bilateral 10/25/2015   Right eye was first.  Left eye done 10/17.  . CERVICAL BIOPSY  W/ LOOP ELECTRODE EXCISION  12/2008   hx CIN II    Social History   Socioeconomic History  . Marital status: Married    Spouse name: Not on file  . Number of children: 2  . Years of education: Not on file  . Highest education level: Not on file  Occupational History    Comment: Retired  Scientific laboratory technician  . Financial resource strain: Not on file  . Food insecurity    Worry: Not on file     Inability: Not on file  . Transportation needs    Medical: Not on file    Non-medical: Not on file  Tobacco Use  . Smoking status: Former Smoker    Packs/day: 0.25    Years: 5.00    Pack years: 1.25    Types: Cigarettes    Quit date: 1975    Years since quitting: 45.6  . Smokeless tobacco: Never Used  . Tobacco comment: in the 61s  Substance and Sexual Activity  . Alcohol use: Yes    Alcohol/week: 1.0 standard drinks    Types: 1 Standard drinks or equivalent per week    Comment: Occasional  . Drug use: No  . Sexual activity: Never  Lifestyle  . Physical activity    Days per week: Not on file    Minutes per session: Not on file  . Stress: Not on file  Relationships  . Social Herbalist on phone: Not on file    Gets together: Not on file    Attends religious service: Not on file    Active member of club or organization: Not on file    Attends meetings of clubs or organizations: Not on file    Relationship status: Not on file  . Intimate partner violence  Fear of current or ex partner: Not on file    Emotionally abused: Not on file    Physically abused: Not on file    Forced sexual activity: Not on file  Other Topics Concern  . Not on file  Social History Narrative  . Not on file    Current Outpatient Medications on File Prior to Visit  Medication Sig Dispense Refill  . allopurinol (ZYLOPRIM) 100 MG tablet Take 100 mg by mouth daily.    Marland Kitchen aspirin EC 81 MG EC tablet Take 1 tablet (81 mg total) by mouth daily. 30 tablet 1  . chlorthalidone (HYGROTON) 25 MG tablet Take 0.5 tablets (12.5 mg total) by mouth every morning. 15 tablet 2  . HYDROcodone-acetaminophen (NORCO) 10-325 MG tablet Take 0.5 tablets by mouth 2 (two) times a day.    . levothyroxine (SYNTHROID) 75 MCG tablet Take 75 mcg by mouth daily before breakfast.    . losartan (COZAAR) 100 MG tablet Take 1 tablet (100 mg total) by mouth daily. 90 tablet 1  . metoprolol succinate (TOPROL-XL) 50 MG 24 hr  tablet Take 50 mg by mouth daily. Take with or immediately following a meal.    . rosuvastatin (CRESTOR) 10 MG tablet TAKE 1 TABLET BY MOUTH EVERY DAY 90 tablet 1  . triamterene-hydrochlorothiazide (MAXZIDE-25) 37.5-25 MG tablet Take 1 tablet by mouth daily.     No current facility-administered medications on file prior to visit.    Review of Systems  Constitution: Positive for weight loss. Negative for chills, decreased appetite, malaise/fatigue and weight gain.  Cardiovascular: Positive for dyspnea on exertion. Negative for leg swelling and syncope.  Respiratory: Negative for sputum production and wheezing.   Endocrine: Negative for cold intolerance.  Hematologic/Lymphatic: Does not bruise/bleed easily.  Musculoskeletal: Positive for back pain (severe spinal stenosis) and joint pain. Negative for joint swelling.  Gastrointestinal: Negative for abdominal pain, anorexia, change in bowel habit, hematochezia and melena.  Neurological: Negative for headaches and light-headedness.  Psychiatric/Behavioral: Negative for depression and substance abuse.  All other systems reviewed and are negative.     Objective  Blood pressure 126/75, pulse (!) 6, temperature (!) 96.7 F (35.9 C), height 5\' 5"  (1.651 m), weight 214 lb 6.4 oz (97.3 kg), last menstrual period 03/05/2000, SpO2 97 %. Body mass index is 35.68 kg/m.  Physical Exam  Constitutional: She appears well-developed. No distress.  Moderately obese  HENT:  Head: Atraumatic.  Eyes: Conjunctivae are normal.  Neck: Neck supple. No thyromegaly present.  Short neck and difficult to evaluate JVP  Cardiovascular: Normal rate, regular rhythm, normal heart sounds, intact distal pulses and normal pulses. Exam reveals no gallop.  No murmur heard. Pulses:      Carotid pulses are 2+ on the right side and 2+ on the left side. Normal arterial examination.  No edema  Pulmonary/Chest: Effort normal and breath sounds normal.  Abdominal: Soft. Bowel  sounds are normal.  Obese. Pannus present  Musculoskeletal: Normal range of motion.        General: No edema.  Neurological: She is alert.  Skin: Skin is warm and dry.  Psychiatric: She has a normal mood and affect.   Laboratory examination:   Labs 06/09/2018: Potassium 4.3, serum glucose 122 mg, BUN 18, creatinine 1.2, AST 36, AST 54 both elevated.  Total cholesterol 222, triglycerides 215, HDL 54, LDL 127.  Uric acid 8.4, elevated.  ANA negative.  Rheumatoid factor negative.  CMP Latest Ref Rng & Units 10/22/2018 09/11/2018 06/21/2018  Glucose  65 - 99 mg/dL 107(H) 121(H) 103(H)  BUN 8 - 27 mg/dL 19 20 26(H)  Creatinine 0.57 - 1.00 mg/dL 1.01(H) 0.95 0.82  Sodium 134 - 144 mmol/L 138 140 138  Potassium 3.5 - 5.2 mmol/L 3.8 3.8 3.3(L)  Chloride 96 - 106 mmol/L 99 101 103  CO2 20 - 29 mmol/L 22 24 26   Calcium 8.7 - 10.3 mg/dL 9.8 9.9 9.7  Total Protein 6.0 - 8.5 g/dL 6.8 6.2 7.0  Total Bilirubin 0.0 - 1.2 mg/dL 1.3(H) 1.1 1.2  Alkaline Phos 39 - 117 IU/L 61 56 50  AST 0 - 40 IU/L 32 35 36  ALT 0 - 32 IU/L 43(H) 43(H) 52(H)   CBC Latest Ref Rng & Units 06/21/2018 08/28/2013 08/28/2013  WBC 4.0 - 10.5 K/uL 4.7 5.6 6.7  Hemoglobin 12.0 - 15.0 g/dL 12.6 14.7 15.3(H)  Hematocrit 36.0 - 46.0 % 38.0 41.7 43.4  Platelets 150 - 400 K/uL 190 239 296   Lipid Panel     Component Value Date/Time   CHOL 121 10/22/2018 0815   TRIG 104 10/22/2018 0815   HDL 45 10/22/2018 0815   CHOLHDL 6.1 08/29/2013 0102   VLDL UNABLE TO CALCULATE IF TRIGLYCERIDE OVER 400 mg/dL 08/29/2013 0102   LDLCALC 55 10/22/2018 0815   LDLDIRECT 58 10/22/2018 0815   HEMOGLOBIN A1C No results found for: HGBA1C, MPG TSH No results for input(s): TSH in the last 8760 hours.  Cardiac Studies:  Lower extremity venous duplex 05/29/2018: No evidence of deep venous thrombosis. Left leg  Echocardiogram [facility] 06/17/2018: Normal LV systolic function, EF [id number].  Mild LVH.  No significant valvular  abnormality.  Stress Echo 09/25/2013: Patient exercised on Bruce protocol for 7:50 minutes and achieved 10.8 METS.  Achieved 107% of MPHR. - Stress echocardiogram with no chest pain nondiagnostic ST changes; no stress-induced wall motion abnormalities; interpreted as normal stress echocardiogram.  Lexiscan Myoview Stress Test 08/06/2018: Nondiagnostic ECG stress due to pharmacologic stress protocol. Normal myocardial perfusion.  Stress LV EF: 72%.  Low risk study.  Assessment     ICD-10-CM   1. Bilateral leg edema  R60.0   2. Exogenous hypertriglyceridemia  E78.3   3. Dyspnea on exertion  R06.09   4. Class 2 severe obesity due to excess calories with serious comorbidity and body mass index (BMI) of 37.0 to 37.9 in adult [facility])  E66.01    Z68.37     EKG 07/17/2018: Normal sinus rhythm at rate of 75 bpm, left atrial enlargement, normal axis.  Incomplete right bundle branch block.  No evidence of ischemia.  Recommendations:   Ms. Asyia Cihlar is a pleasant 73 year old Caucasian female who appears much younger than stated age referred to me for evaluation of leg edema and dyspnea on exertion.   She has done remarkable job with regard Weight loss, she is also tolerating statins well, so for she has lost about 15-20 pounds in weight in the past 2 months.  She is also tolerating phentermine without any side effects, blood pressure is well controlled.  I also reviewed her labs including hypertriglyceridemia, labs are totally normalized.  She is tolerating Crestor well. Since making lifestyle changes and dietary changes, leg edema has completely resolved.  From cardiac standpoint she remained stable, she appears to be motivated in maintaining weight loss.  Would recommend continuing phentermine for at least 6 months to 8 months, then discontinue the medication.  I'll request her PCP Isaias Cowman, PA-C to take over the care and I  will see her back on a p.r.n. basis.  Adrian Prows, MD,  [facility] 11/03/2018, 9:08 PM [facility]. [place] Pager: [phone] Office: [phone] If no answer Cell [phone]

## 2018-12-12 ENCOUNTER — Other Ambulatory Visit: Payer: Self-pay | Admitting: Cardiology

## 2018-12-12 DIAGNOSIS — I10 Essential (primary) hypertension: Secondary | ICD-10-CM

## 2019-01-16 IMAGING — MG DIGITAL SCREENING BILATERAL MAMMOGRAM WITH TOMO AND CAD
6 of 9 series · 6 of 25 positions shown · non-contrast
Comparison: Previous exam(s).

CLINICAL DATA: Screening.

EXAM:
DIGITAL SCREENING BILATERAL MAMMOGRAM WITH TOMO AND CAD

[L CC]
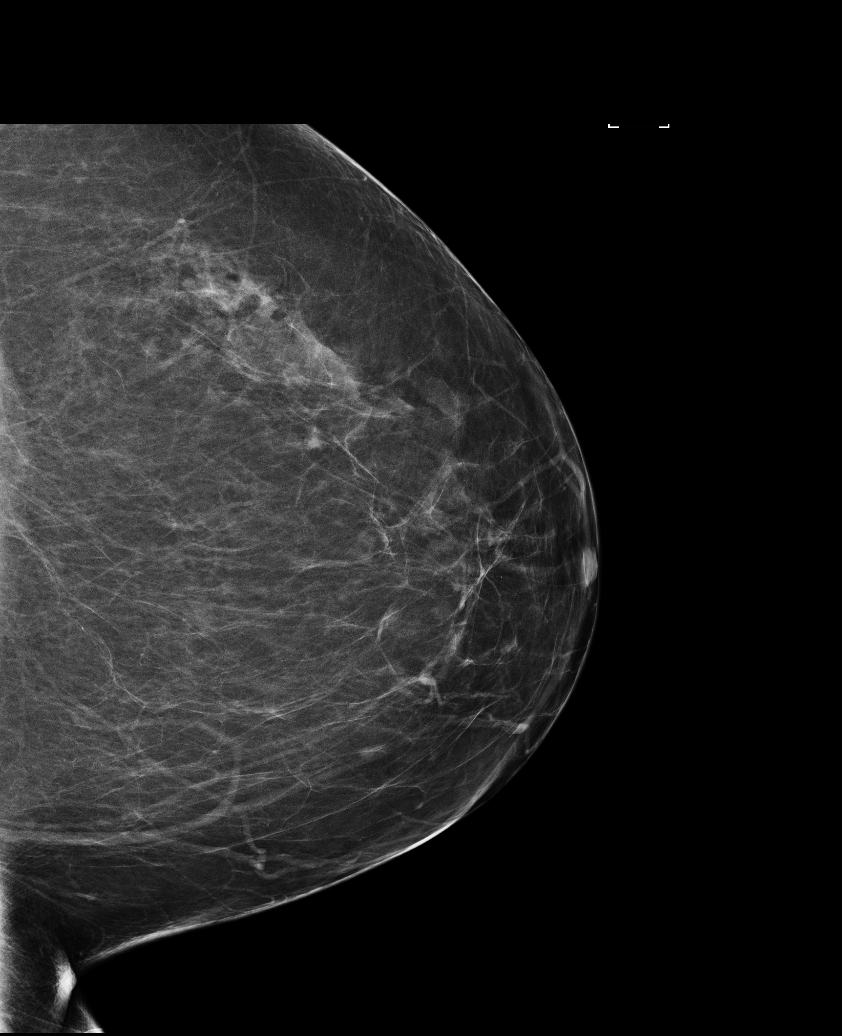

[R MLO synth-2D]
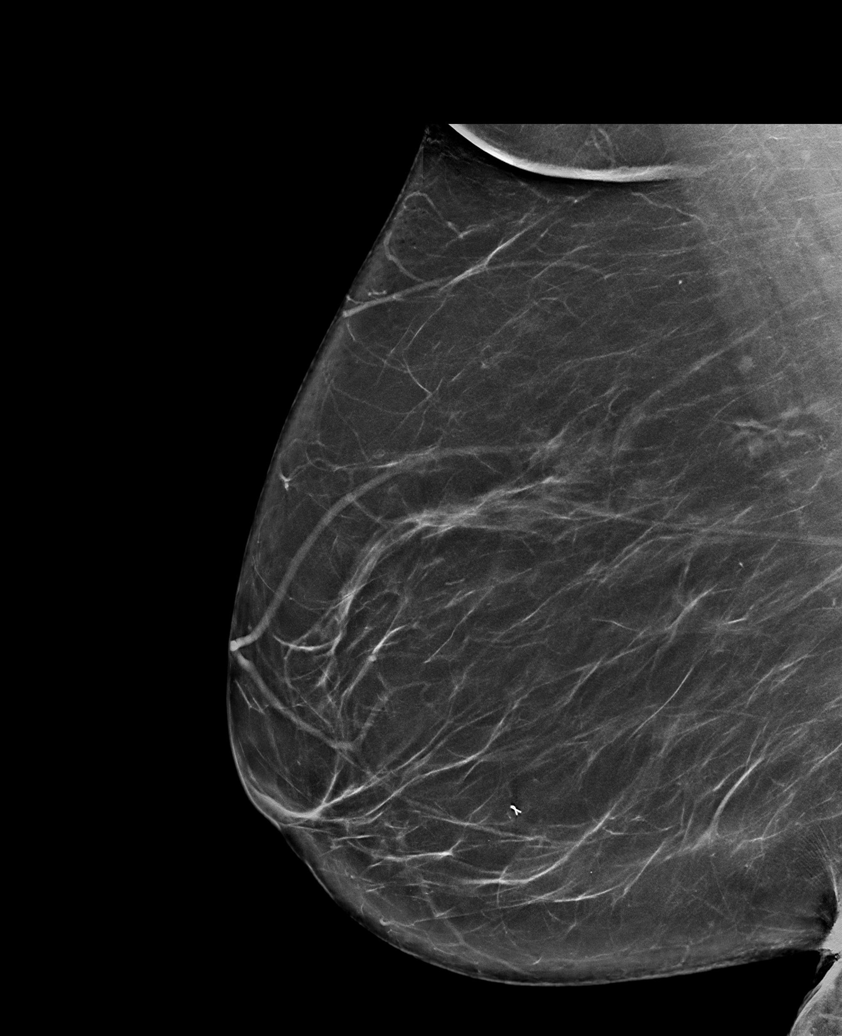

[L MLO synth-2D]
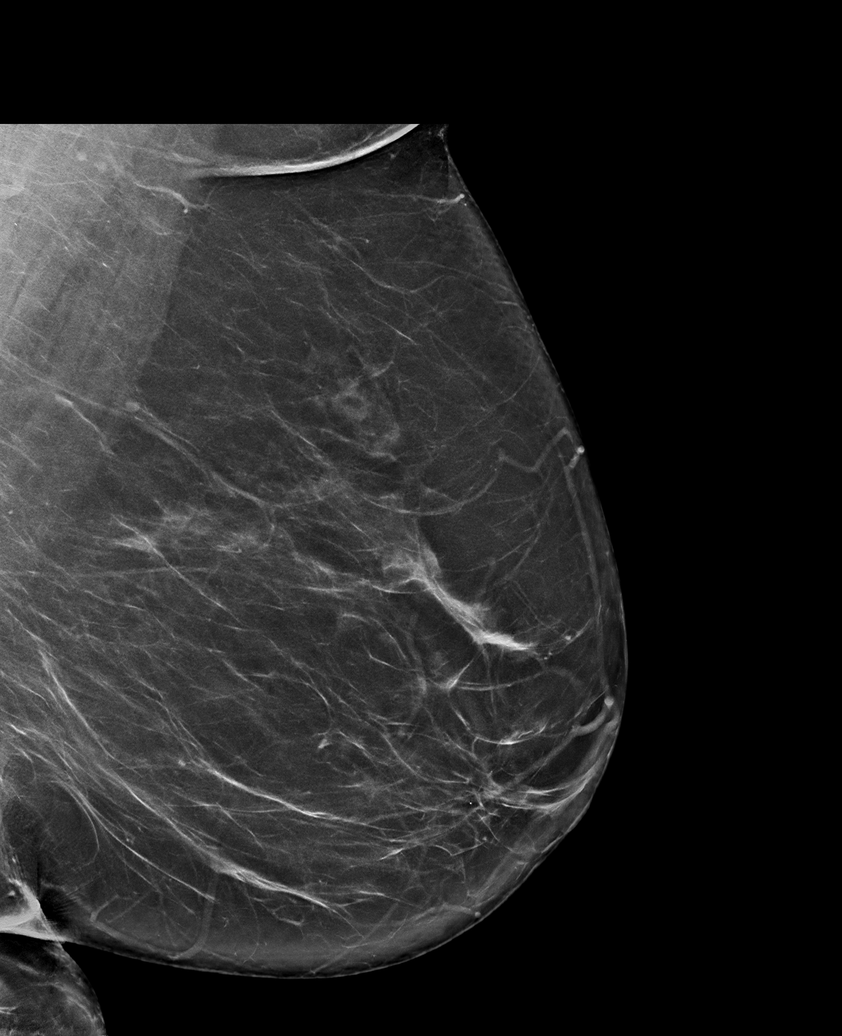

[R CC synth-2D]
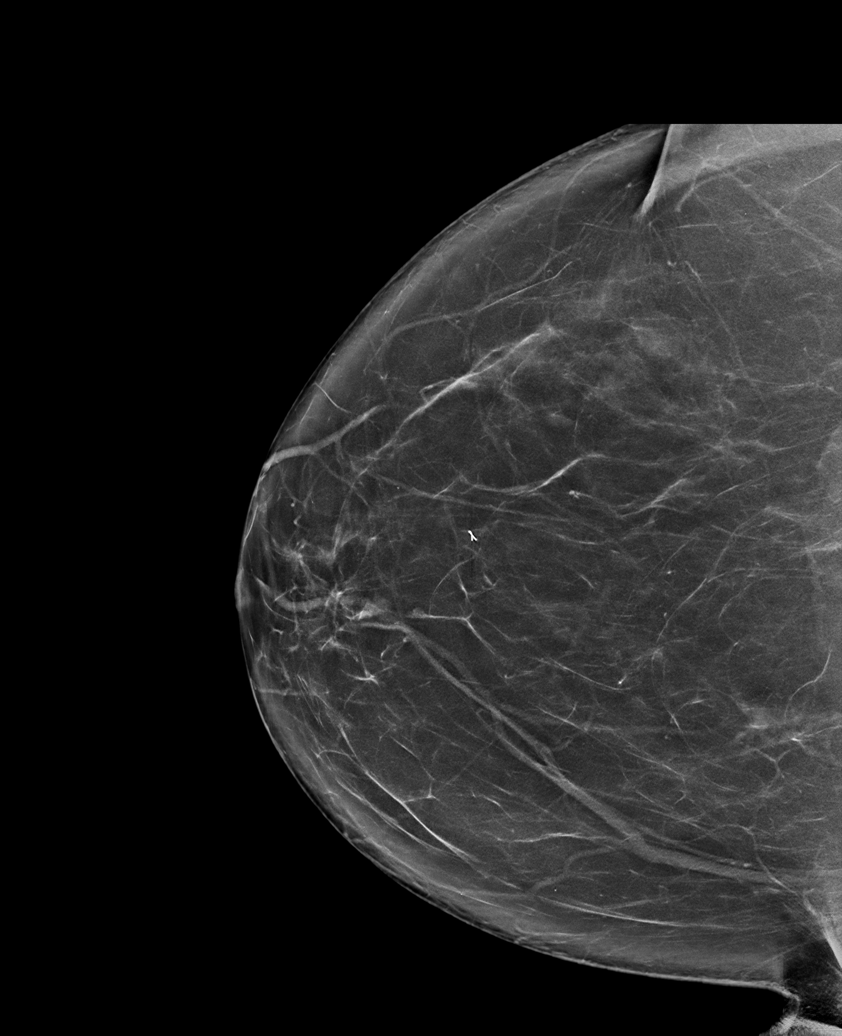

[L CC synth-2D]
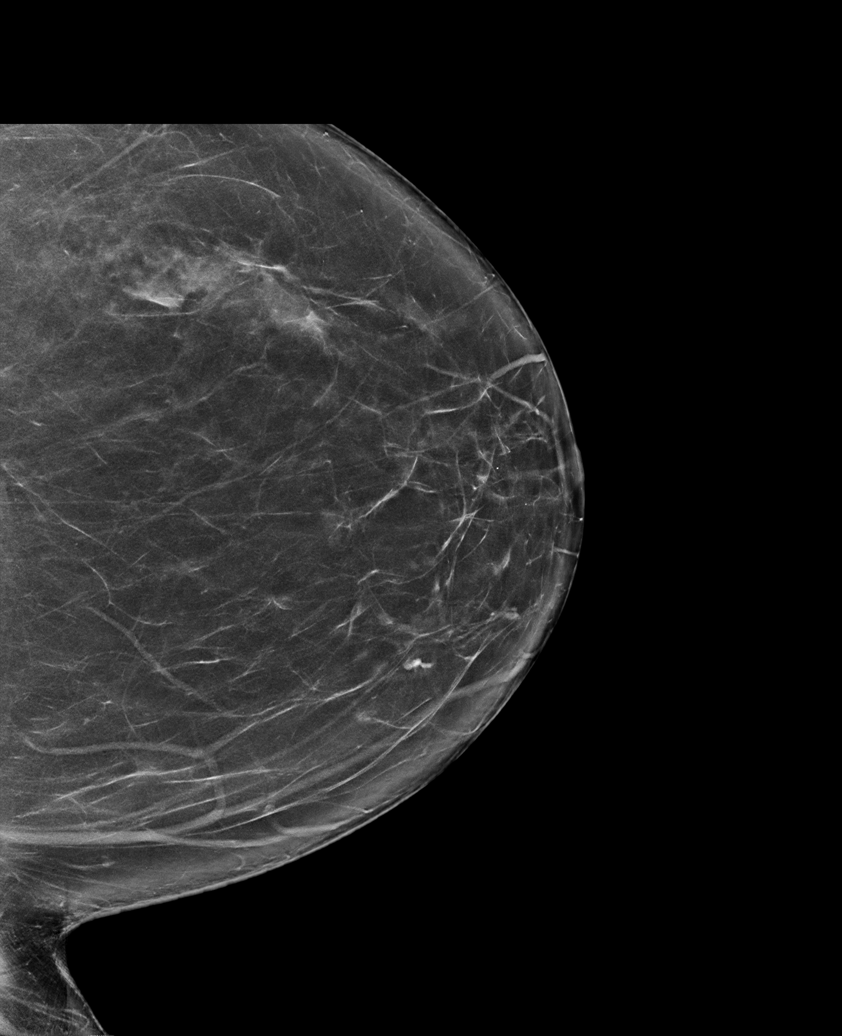

[L MLO tomo · tomo slice 43/86.0]
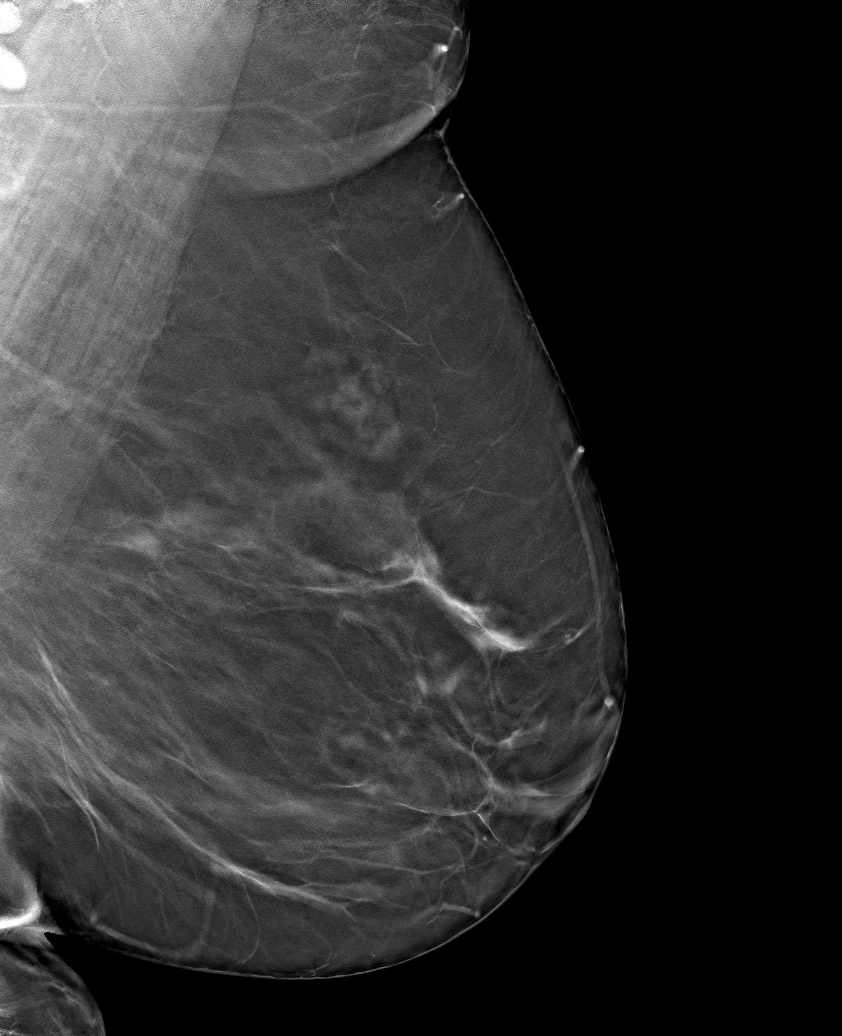

[6 of 25 positions shown; findings below may reference images not displayed]

ACR Breast Density Category b: There are scattered areas of
fibroglandular density.
FINDINGS: There are no findings suspicious for malignancy. Images were
processed with CAD.
IMPRESSION: No mammographic evidence of malignancy. A result letter of this
screening mammogram will be mailed directly to the patient.

RECOMMENDATION:
Screening mammogram in one year. (Code:[id number])

BI-RADS CATEGORY  1: Negative.

## 2019-02-02 ENCOUNTER — Other Ambulatory Visit: Payer: Self-pay | Admitting: Podiatry

## 2019-02-02 ENCOUNTER — Telehealth: Payer: Self-pay

## 2019-02-02 ENCOUNTER — Ambulatory Visit (INDEPENDENT_AMBULATORY_CARE_PROVIDER_SITE_OTHER): Payer: Medicare Other | Admitting: Podiatry

## 2019-02-02 ENCOUNTER — Other Ambulatory Visit: Payer: Self-pay

## 2019-02-02 ENCOUNTER — Ambulatory Visit (INDEPENDENT_AMBULATORY_CARE_PROVIDER_SITE_OTHER): Payer: Medicare Other

## 2019-02-02 DIAGNOSIS — M659 Synovitis and tenosynovitis, unspecified: Secondary | ICD-10-CM

## 2019-02-02 DIAGNOSIS — M19071 Primary osteoarthritis, right ankle and foot: Secondary | ICD-10-CM

## 2019-02-02 DIAGNOSIS — M79671 Pain in right foot: Secondary | ICD-10-CM

## 2019-02-02 MED ORDER — MELOXICAM 15 MG PO TABS: 15.0000 mg | ORAL_TABLET | Freq: Every day | ORAL | 1 refills | Status: DC

## 2019-02-02 NOTE — Telephone Encounter (Signed)
Last seen 11/03/18. Pt called stating that her heart has been beating faster than normal since Saturday. No new medications on a steroid injection given in her back x 2 weeks ago. She does not have a pulse ox or means to check it other than when she was riding her bike this morning, it registered at 100. She's normally in the 70's. But she said that it was beating fast as we were talking on the phone. Wanted to be seen today but advised of no availability per LC. Please review and advise. //ah

## 2019-02-02 NOTE — Telephone Encounter (Signed)
Her symptoms may be related to steroid use, if symptoms persist, consider event monitor for 10 days.  JG

## 2019-02-03 NOTE — Telephone Encounter (Signed)
Informed pt that her symptoms could be due to the steroid shot. Inform pt she would need to wait a few days to see if she still has the tachy, and if so she will need to have an event monitor for 10 days pt understood and will call back if still with symptoms.

## 2019-02-05 NOTE — Progress Notes (Signed)
   Subjective:  73 y.o. female presenting today with a chief complaint of medial right ankle pain that began 3-4 months ago. She believes she may be having a flare up of the pain she was having several months ago when she was diagnosed with posterior tibial tendinitis of the right lower extremity. She has been using her orthotics and compression anklet for treatment. Being on the foot for long periods of time increases the pain. Patient is here for further evaluation and treatment.   Past Medical History:  Diagnosis Date  . Anxiety   . Depression   . Fibromyalgia   . GERD (gastroesophageal reflux disease)   . Gout 04/2017  . Hyperlipidemia   . Hypertension   . Hypothyroidism       . Low kidney function    followed by Dr Alyson Ingles  . Spinal stenosis      Objective / Physical Exam:  General:  The patient is alert and oriented x3 in no acute distress. Dermatology:  Skin is warm, dry and supple bilateral lower extremities. Negative for open lesions or macerations. Vascular:  Palpable pedal pulses bilaterally. No edema or erythema noted. Capillary refill within normal limits. Neurological:  Epicritic and protective threshold grossly intact bilaterally.  Musculoskeletal Exam:  Pain on palpation to the medial aspects of the patient's right ankle. Mild edema noted. Range of motion within normal limits to all pedal and ankle joints bilateral. Muscle strength 5/5 in all groups bilateral.   Radiographic Exam:  Normal osseous mineralization. Joint spaces preserved. No fracture/dislocation/boney destruction.    Assessment: 1. Ankle synovitis right medial   Plan of Care:  1. Patient was evaluated. X-Rays reviewed.  2. Injection of 0.5 mL Celestone Soluspan injected in the patient's right ankle. 3. Prescription for Meloxicam 15mg  provided to patient. 4. Continue using custom orthotics and compression anklet.  5. Return to clinic as needed.     Edrick Kins, DPM [facility]  Dr. Edrick Kins, [address]                                        [place], [place] [address]                Office [phone]  Fax [phone]

## 2019-03-05 ENCOUNTER — Encounter

## 2019-03-06 HISTORY — PX: LUMBAR FUSION: SHX111

## 2019-03-15 ENCOUNTER — Other Ambulatory Visit: Payer: Self-pay | Admitting: Cardiology

## 2019-03-15 DIAGNOSIS — I10 Essential (primary) hypertension: Secondary | ICD-10-CM

## 2019-03-31 ENCOUNTER — Other Ambulatory Visit: Payer: Self-pay | Admitting: Cardiology

## 2019-03-31 DIAGNOSIS — E782 Mixed hyperlipidemia: Secondary | ICD-10-CM

## 2019-04-03 ENCOUNTER — Other Ambulatory Visit: Payer: Self-pay | Admitting: Student

## 2019-04-03 DIAGNOSIS — G5603 Carpal tunnel syndrome, bilateral upper limbs: Secondary | ICD-10-CM

## 2019-04-08 ENCOUNTER — Other Ambulatory Visit: Payer: Self-pay | Admitting: Cardiology

## 2019-04-10 NOTE — Telephone Encounter (Signed)
Ok to refill 

## 2019-04-16 ENCOUNTER — Other Ambulatory Visit: Payer: Self-pay | Admitting: Cardiology

## 2019-04-17 ENCOUNTER — Ambulatory Visit
Admission: RE | Admit: 2019-04-17 | Discharge: 2019-04-17 | Disposition: A | Discharge status: Home / Self Care | Payer: Medicare Other | Source: Ambulatory Visit | Attending: Student | Admitting: Student

## 2019-04-17 DIAGNOSIS — G5603 Carpal tunnel syndrome, bilateral upper limbs: Secondary | ICD-10-CM

## 2019-04-26 ENCOUNTER — Ambulatory Visit: Payer: Medicare Other | Attending: Internal Medicine

## 2019-04-26 DIAGNOSIS — Z23 Encounter for immunization: Secondary | ICD-10-CM

## 2019-04-26 NOTE — Progress Notes (Signed)
   Covid-19 Vaccination Clinic  Name:  Patricia Parks    MRN: [id number] DOB: August 06, 1945  04/26/2019  [name] was observed post Covid-19 immunization for 15 minutes without incidence. She was provided with Vaccine Information Sheet and instruction to access the V-Safe system.   [name] was instructed to call 911 with any severe reactions post vaccine: Marland Kitchen Difficulty breathing  . Swelling of your face and throat  . A fast heartbeat  . A bad rash all over your body  . Dizziness and weakness    Immunizations Administered    Name Date Dose VIS Date Route   Pfizer COVID-19 Vaccine 04/26/2019 10:25 AM 0.3 mL 02/13/2019 Intramuscular   Manufacturer: [place]   Lot: [id number]   [place]: [id number]

## 2019-05-20 ENCOUNTER — Ambulatory Visit: Payer: Medicare Other | Attending: Internal Medicine

## 2019-05-20 DIAGNOSIS — Z23 Encounter for immunization: Secondary | ICD-10-CM

## 2019-05-20 NOTE — Progress Notes (Signed)
   Covid-19 Vaccination Clinic  Name:  Patricia Parks    MRN: [id number] DOB: 16-May-1945  05/20/2019  [name] was observed post Covid-19 immunization for 15 minutes without incident. She was provided with Vaccine Information Sheet and instruction to access the V-Safe system.   [name] was instructed to call 911 with any severe reactions post vaccine: Marland Kitchen Difficulty breathing  . Swelling of face and throat  . A fast heartbeat  . A bad rash all over body  . Dizziness and weakness   Immunizations Administered    Name Date Dose VIS Date Route   Pfizer COVID-19 Vaccine 05/20/2019  8:32 AM 0.3 mL 02/13/2019 Intramuscular   Manufacturer: [place]   Lot: [id number]   [place]: [id number]

## 2019-06-02 ENCOUNTER — Other Ambulatory Visit: Payer: Self-pay | Admitting: Cardiology

## 2019-06-02 DIAGNOSIS — Z1231 Encounter for screening mammogram for malignant neoplasm of breast: Secondary | ICD-10-CM

## 2019-07-02 ENCOUNTER — Ambulatory Visit
Admission: RE | Admit: 2019-07-02 | Discharge: 2019-07-02 | Disposition: A | Discharge status: Home / Self Care | Payer: Medicare Other | Source: Ambulatory Visit | Attending: Cardiology | Admitting: Cardiology

## 2019-07-02 ENCOUNTER — Other Ambulatory Visit: Payer: Self-pay

## 2019-07-02 DIAGNOSIS — Z1231 Encounter for screening mammogram for malignant neoplasm of breast: Secondary | ICD-10-CM

## 2019-07-27 ENCOUNTER — Other Ambulatory Visit: Payer: Self-pay

## 2019-07-27 ENCOUNTER — Encounter: Payer: Self-pay | Admitting: Podiatry

## 2019-07-27 ENCOUNTER — Ambulatory Visit: Payer: Medicare Other | Admitting: Podiatry

## 2019-07-27 DIAGNOSIS — M7671 Peroneal tendinitis, right leg: Secondary | ICD-10-CM | POA: Diagnosis not present

## 2019-07-27 MED ORDER — MELOXICAM 15 MG PO TABS: 15.0000 mg | ORAL_TABLET | Freq: Every day | ORAL | 3 refills | Status: DC

## 2019-07-29 NOTE — Progress Notes (Signed)
    HPI: 74 y.o. female presenting today with a chief complaint of medial right ankle pain that began a few weeks ago. She reports the last injection she received helped temporarily relieve the pain. Walking increases the pain. She has been taking Lyrica but is unsure if it is helping. Patient is here for further evaluation and treatment.   Past Medical History:  Diagnosis Date  . Anxiety   . Depression   . Fibromyalgia   . GERD (gastroesophageal reflux disease)   . Gout 04/2017  . Hyperlipidemia   . Hypertension   . Hypothyroidism       . Low kidney function    followed by Dr Alyson Ingles  . Spinal stenosis        Physical Exam: General: The patient is alert and oriented x3 in no acute distress.  Dermatology: Skin is warm, dry and supple bilateral lower extremities. Negative for open lesions or macerations.  Vascular: Palpable pedal pulses bilaterally. No edema or erythema noted. Capillary refill within normal limits.  Neurological: Epicritic and protective threshold grossly intact bilaterally.   Musculoskeletal Exam: Pain on palpation noted to the posterior tibial tendon of the right foot. Range of motion within normal limits. Muscle strength 5/5 in all muscle groups bilateral lower extremities.  Assessment: 1. Posterior tibial tendinitis right    Plan of Care:  1. Patient was evaluated.  2. Injection of 0.5 mL Celestone Soluspan injected into the posterior tibial tendon sheath.  3. Refill prescription for Meloxicam provided to patient.  4. Ankle brace provided to patient.  5. Continue taking Lyrica 300 mg BID as directed by PCP.  6. Return to clinic as needed.    Edrick Kins, DPM [facility]  Dr. Edrick Kins, [address]                                        [place], [place] [address]                Office [phone]  Fax [phone]

## 2019-09-16 ENCOUNTER — Other Ambulatory Visit: Payer: Self-pay | Admitting: Cardiology

## 2019-09-16 DIAGNOSIS — I10 Essential (primary) hypertension: Secondary | ICD-10-CM

## 2019-09-29 ENCOUNTER — Other Ambulatory Visit: Payer: Self-pay | Admitting: Cardiology

## 2019-09-29 DIAGNOSIS — E782 Mixed hyperlipidemia: Secondary | ICD-10-CM

## 2019-10-11 ENCOUNTER — Other Ambulatory Visit: Payer: Self-pay | Admitting: Cardiology

## 2019-10-11 DIAGNOSIS — I10 Essential (primary) hypertension: Secondary | ICD-10-CM

## 2019-10-12 ENCOUNTER — Other Ambulatory Visit: Payer: Self-pay | Admitting: Endocrinology

## 2019-10-12 DIAGNOSIS — E049 Nontoxic goiter, unspecified: Secondary | ICD-10-CM

## 2019-11-08 ENCOUNTER — Other Ambulatory Visit: Payer: Self-pay | Admitting: Cardiology

## 2019-11-08 DIAGNOSIS — I10 Essential (primary) hypertension: Secondary | ICD-10-CM

## 2019-11-12 ENCOUNTER — Other Ambulatory Visit: Payer: Self-pay | Admitting: Neurosurgery

## 2019-11-12 DIAGNOSIS — M48062 Spinal stenosis, lumbar region with neurogenic claudication: Secondary | ICD-10-CM

## 2019-11-17 NOTE — Progress Notes (Deleted)
74 y.o. G64P2002 Married White or Caucasian female here for breast and pelvic exam.  I am also following her for ***.  Denies vaginal bleeding.  Patient's last menstrual period was 03/05/2000.          Sexually active: {yes no:314532}  H/O STD:  {YES NO:22349}  Health Maintenance: PCP:  Isaias Cowman  Last wellness appt was ***.  Did blood work at that appt:   Vaccines are up to date:  {YES NO:22349} Colonoscopy:  *** MMG:  *** BMD:  *** Last pap smear:  ***.   H/o abnormal pap smear:      reports that she quit smoking about 46 years ago. Her smoking use included cigarettes. She has a 1.25 pack-year smoking history. She has never used smokeless tobacco. She reports current alcohol use of about 1.0 standard drink of alcohol per week. She reports that she does not use drugs.  Past Medical History:  Diagnosis Date  . Anxiety   . Depression   . Fibromyalgia   . GERD (gastroesophageal reflux disease)   . Gout 04/2017  . Hyperlipidemia   . Hypertension   . Hypothyroidism       . Low kidney function    followed by Dr Alyson Ingles  . Spinal stenosis     Past Surgical History:  Procedure Laterality Date  . Carpel Tunnel Surgery Right 10/17/2018  . CATARACT EXTRACTION W/ INTRAOCULAR LENS IMPLANT Bilateral 10/25/2015   Right eye was first.  Left eye done 10/17.  . CERVICAL BIOPSY  W/ LOOP ELECTRODE EXCISION  12/2008   hx CIN II    Current Outpatient Medications  Medication Sig Dispense Refill  . allopurinol (ZYLOPRIM) 100 MG tablet Take 100 mg by mouth daily.    Marland Kitchen aspirin EC 81 MG EC tablet Take 1 tablet (81 mg total) by mouth daily. 30 tablet 1  . busPIRone (BUSPAR) 7.5 MG tablet Take 7.5 mg by mouth 2 (two) times daily.    . chlorthalidone (HYGROTON) 25 MG tablet TAKE 1/2 TABLET BY MOUTH EVERY MORNING 45 tablet 3  . diclofenac Sodium (VOLTAREN) 1 % GEL Apply as directed    . furosemide (LASIX) 20 MG tablet Take 40 mg by mouth daily.    Marland Kitchen HYDROcodone-acetaminophen (NORCO) 10-325 MG  tablet Take 0.5 tablets by mouth 2 (two) times a day.    . levothyroxine (SYNTHROID) 75 MCG tablet Take 75 mcg by mouth daily before breakfast.    . losartan (COZAAR) 100 MG tablet TAKE 1 TABLET BY MOUTH EVERY DAY 90 tablet 3  . LYRICA 100 MG capsule Take 100 mg by mouth 3 (three) times daily.    . meloxicam (MOBIC) 15 MG tablet Take 1 tablet (15 mg total) by mouth daily. 90 tablet 3  . metoprolol succinate (TOPROL-XL) 50 MG 24 hr tablet Take 50 mg by mouth daily. Take with or immediately following a meal.    . phentermine 30 MG capsule Take 1 capsule (30 mg total) by mouth every morning. 30 capsule 0  . rosuvastatin (CRESTOR) 10 MG tablet TAKE 1 TABLET BY MOUTH EVERY DAY 90 tablet 1  . triamterene-hydrochlorothiazide (MAXZIDE-25) 37.5-25 MG tablet Take 1 tablet by mouth daily.     No current facility-administered medications for this visit.    Family History  Problem Relation Age of Onset  . Osteoporosis Mother   . Hypertension Mother   . Uterine cancer Mother   . Heart disease Mother        Atrial fibrillation  .  Breast cancer Sister   . Osteoporosis Sister   . Diabetes Sister   . Diabetes Father   . Heart disease Sister     Review of Systems  Exam:   LMP 03/05/2000      General appearance: alert, cooperative and appears stated age Breasts: {Exam; breast:13139::"normal appearance, no masses or tenderness"} Abdomen: soft, non-tender; bowel sounds normal; no masses,  no organomegaly Lymph nodes: Cervical, supraclavicular, and axillary nodes normal.  No abnormal inguinal nodes palpated Neurologic: Grossly normal  Pelvic: External genitalia:  no lesions              Urethra:  normal appearing urethra with no masses, tenderness or lesions              Bartholins and Skenes: normal                 Vagina: normal appearing vagina with normal color and discharge, no lesions              Cervix: {exam; cervix:14595}              Pap taken: {yes no:314532} Bimanual Exam:  Uterus:   {exam; uterus:12215}              Adnexa: {exam; adnexa:12223}               Rectovaginal: Confirms               Anus:  normal sphincter tone, no lesions  Chaperone, ***, CMA, was present for exam.  A:  Breast and Pelvic exam  P:   {plan; gyn:5269::"mammogram","pap smear","return annually or prn"}

## 2019-11-20 ENCOUNTER — Telehealth: Payer: Self-pay | Admitting: Obstetrics & Gynecology

## 2019-11-20 ENCOUNTER — Ambulatory Visit: Payer: Medicare Other | Admitting: Obstetrics & Gynecology

## 2019-11-20 NOTE — Telephone Encounter (Signed)
Patient cancelled appointment because she is sick. She will call back to reschedule.

## 2019-12-03 ENCOUNTER — Other Ambulatory Visit: Payer: Self-pay

## 2019-12-03 ENCOUNTER — Ambulatory Visit
Admission: RE | Admit: 2019-12-03 | Discharge: 2019-12-03 | Disposition: A | Discharge status: Home / Self Care | Payer: Medicare Other | Source: Ambulatory Visit | Attending: Neurosurgery | Admitting: Neurosurgery

## 2019-12-03 DIAGNOSIS — M48062 Spinal stenosis, lumbar region with neurogenic claudication: Secondary | ICD-10-CM

## 2019-12-14 ENCOUNTER — Other Ambulatory Visit: Payer: Self-pay | Admitting: Cardiology

## 2019-12-14 DIAGNOSIS — I10 Essential (primary) hypertension: Secondary | ICD-10-CM

## 2019-12-17 ENCOUNTER — Other Ambulatory Visit: Payer: Self-pay | Admitting: Neurosurgery

## 2019-12-18 ENCOUNTER — Other Ambulatory Visit: Payer: Self-pay | Admitting: Neurosurgery

## 2020-01-08 NOTE — Progress Notes (Addendum)
[id number] - [place], [place] [address] AT [address] [place] [address] Phone: [phone] Fax: [place], [address] [place] [address] Phone: [phone] Fax: [phone]      Your procedure is scheduled on 12/14/19.  Report to [facility] Main Entrance "A" at 6:30 A.M., and check in at the Admitting office.  Call this number if you have problems the morning of surgery:  [phone]  Call [phone] if you have any questions prior to your surgery date Monday-Friday 8am-4pm    Remember:  Do not eat or drink after midnight the night before your surgery    Take these medicines the morning of surgery with A SIP OF WATER: allopurinol (ZYLOPRIM)  amLODipine (NORVASC) busPIRone (BUSPAR)  chlorthalidone (HYGROTON)  levothyroxine (SYNTHROID) LYRICA  metoprolol succinate (TOPROL-XL)  rosuvastatin (CRESTOR)  HYDROcodone-acetaminophen (NORCO) - if needed  As of today, STOP taking any Aspirin (unless otherwise instructed by your surgeon) Aleve, Naproxen, Ibuprofen, Motrin, Advil, Goody's, BC's, all herbal medications, fish oil, and all vitamins.                      Do not wear jewelry, make up, or nail polish            Do not wear lotions, powders, perfumes  or deodorant.            Do not shave 48 hours prior to surgery.              Do not bring valuables to the hospital.            [facility] is not responsible for any belongings or valuables.  Do NOT Smoke (Tobacco/Vaping) or drink Alcohol 24 hours prior to your procedure If you use a CPAP at night, you may bring all equipment for your overnight stay.   Contacts, glasses, dentures or bridgework may not be worn into surgery.      For patients admitted to the hospital, discharge time will be determined by your treatment team.   Patients  discharged the day of surgery will not be allowed to drive home, and someone needs to stay with them for 24 hours.    Special instructions:   [facility]- Preparing For Surgery  Before surgery, you can play an important role. Because skin is not sterile, your skin needs to be as free of germs as possible. You can reduce the number of germs on your skin by washing with CHG (chlorahexidine gluconate) Soap before surgery.  CHG is an antiseptic cleaner which kills germs and bonds with the skin to continue killing germs even after washing.    Oral Hygiene is also important to reduce your risk of infection.  Remember - BRUSH YOUR TEETH THE MORNING OF SURGERY WITH YOUR REGULAR TOOTHPASTE  Please do not use if you have an allergy to CHG or antibacterial soaps. If your skin becomes reddened/irritated stop using the CHG.  Do not shave (including legs and underarms) for at least 48 hours prior to first CHG shower. It is OK to shave your face.  Please follow these instructions carefully.   1. Shower the NIGHT BEFORE SURGERY and the MORNING OF SURGERY with CHG Soap.   2. If you chose to wash your hair, wash your hair first as usual with your normal shampoo.  3. After you  shampoo, rinse your hair and body thoroughly to remove the shampoo.  4. Use CHG as you would any other liquid soap. You can apply CHG directly to the skin and wash gently with a scrungie or a clean washcloth.   5. Apply the CHG Soap to your body ONLY FROM THE NECK DOWN.  Do not use on open wounds or open sores. Avoid contact with your eyes, ears, mouth and genitals (private parts). Wash Face and genitals (private parts)  with your normal soap.   6. Wash thoroughly, paying special attention to the area where your surgery will be performed.  7. Thoroughly rinse your body with warm water from the neck down.  8. DO NOT shower/wash with your normal soap after using and rinsing off the CHG Soap.  9. Pat yourself dry with a CLEAN  TOWEL.  10. Wear CLEAN PAJAMAS to bed the night before surgery  11. Place CLEAN SHEETS on your bed the night of your first shower and DO NOT SLEEP WITH PETS.   Day of Surgery: Wear Clean/Comfortable clothing the morning of surgery Do not apply any deodorants/lotions.   Remember to brush your teeth WITH YOUR REGULAR TOOTHPASTE.   Please read over the following fact sheets that you were given.

## 2020-01-11 ENCOUNTER — Encounter (HOSPITAL_COMMUNITY): Payer: Self-pay | Admitting: Neurosurgery

## 2020-01-11 ENCOUNTER — Other Ambulatory Visit: Payer: Self-pay

## 2020-01-11 ENCOUNTER — Other Ambulatory Visit (HOSPITAL_COMMUNITY)
Admission: RE | Admit: 2020-01-11 | Discharge: 2020-01-11 | Disposition: A | Discharge status: Home / Self Care | Payer: Medicare Other | Source: Ambulatory Visit | Attending: Neurosurgery | Admitting: Neurosurgery

## 2020-01-11 ENCOUNTER — Other Ambulatory Visit (HOSPITAL_COMMUNITY): Payer: Medicare Other

## 2020-01-11 ENCOUNTER — Inpatient Hospital Stay (HOSPITAL_COMMUNITY)
Admission: RE | Admit: 2020-01-11 | Discharge: 2020-01-11 | Disposition: A | Discharge status: Home / Self Care | Payer: Medicare Other | Source: Ambulatory Visit

## 2020-01-11 DIAGNOSIS — Z20822 Contact with and (suspected) exposure to covid-19: Secondary | ICD-10-CM | POA: Insufficient documentation

## 2020-01-11 DIAGNOSIS — Z01812 Encounter for preprocedural laboratory examination: Secondary | ICD-10-CM | POA: Insufficient documentation

## 2020-01-11 LAB — SARS CORONAVIRUS 2 (TAT 6-24 HRS): SARS Coronavirus 2: NEGATIVE

## 2020-01-11 NOTE — Progress Notes (Signed)
PCP - Dr. Roque Cash Cardiologist - Dr. Adrian Prows - saw once for what patient felt was a fast heart rate.  Per patient not diagnosed with an arrhythmia.   Chest x-ray - 11/21/18 EKG - Need on DOS Stress Test - 08/06/18 ECHO - 09/25/13 also in 2020 by Dr. Einar Gip Cardiac Cath - Denies  Sleep Study - Denies  DM - Denies  COVID TEST- 01/11/20  Anesthesia review: No  Patient denies shortness of breath, fever, cough and chest pain.  All instructions explained to the patient, with a verbal understanding via phone. Patient not feeling great the morning of her PaT appt. After speaking to patient she revealed feeling anxiety making her nauseous.  I did tell her if things worsened with her not feeling well to reach out to her surgeons office to make them aware.

## 2020-01-12 NOTE — Anesthesia Preprocedure Evaluation (Addendum)
Anesthesia Evaluation  Patient identified by MRN, date of birth, ID band Patient awake    Reviewed: Allergy & Precautions, NPO status , Patient's Chart, lab work & pertinent test results  Airway Mallampati: II  TM Distance: >3 FB Neck ROM: Full    Dental  (+) Teeth Intact, Dental Advisory Given   Pulmonary former smoker,    Pulmonary exam normal        Cardiovascular hypertension, Pt. on medications and Pt. on home beta blockers Normal cardiovascular exam     Neuro/Psych PSYCHIATRIC DISORDERS Anxiety Depression negative neurological ROS     GI/Hepatic (+)     substance abuse  ,   Endo/Other  Hypothyroidism Morbid obesity  Renal/GU negative Renal ROS     Musculoskeletal  (+) Fibromyalgia -, narcotic dependentGout   Abdominal   Peds  Hematology HLD   Anesthesia Other Findings Stenosis  Reproductive/Obstetrics                          Anesthesia Physical Anesthesia Plan  ASA: II  Anesthesia Plan: General   Post-op Pain Management:    Induction: Intravenous  PONV Risk Score and Plan: 3 and Ondansetron, Dexamethasone, Midazolam and Treatment may vary due to age or medical condition  Airway Management Planned: Oral ETT  Additional Equipment: Arterial line  Intra-op Plan:   Post-operative Plan: Extubation in OR  Informed Consent: I have reviewed the patients History and Physical, chart, labs and discussed the procedure including the risks, benefits and alternatives for the proposed anesthesia with the patient or authorized representative who has indicated his/her understanding and acceptance.     Dental advisory given  Plan Discussed with: CRNA  Anesthesia Plan Comments: (Ketamine Precedex)      Anesthesia Quick Evaluation

## 2020-01-13 ENCOUNTER — Other Ambulatory Visit: Payer: Self-pay

## 2020-01-13 ENCOUNTER — Inpatient Hospital Stay (HOSPITAL_COMMUNITY)
Admission: RE | Admit: 2020-01-13 | Discharge: 2020-01-21 | DRG: 455 | Disposition: A | Discharge status: Skilled Nursing Facility | Payer: Medicare Other | Attending: Neurosurgery | Admitting: Neurosurgery

## 2020-01-13 ENCOUNTER — Inpatient Hospital Stay (HOSPITAL_COMMUNITY): Payer: Medicare Other | Admitting: Anesthesiology

## 2020-01-13 ENCOUNTER — Encounter (HOSPITAL_COMMUNITY): Payer: Self-pay | Admitting: Neurosurgery

## 2020-01-13 ENCOUNTER — Inpatient Hospital Stay (HOSPITAL_COMMUNITY): Payer: Medicare Other

## 2020-01-13 ENCOUNTER — Inpatient Hospital Stay (HOSPITAL_COMMUNITY)
Admission: RE | Disposition: A | Discharge status: Skilled Nursing Facility | Payer: Self-pay | Source: Home / Self Care | Attending: Neurosurgery

## 2020-01-13 DIAGNOSIS — M48062 Spinal stenosis, lumbar region with neurogenic claudication: Principal | ICD-10-CM | POA: Diagnosis present

## 2020-01-13 DIAGNOSIS — F419 Anxiety disorder, unspecified: Secondary | ICD-10-CM | POA: Diagnosis present

## 2020-01-13 DIAGNOSIS — M797 Fibromyalgia: Secondary | ICD-10-CM | POA: Diagnosis present

## 2020-01-13 DIAGNOSIS — Z6839 Body mass index (BMI) 39.0-39.9, adult: Secondary | ICD-10-CM

## 2020-01-13 DIAGNOSIS — K219 Gastro-esophageal reflux disease without esophagitis: Secondary | ICD-10-CM | POA: Diagnosis present

## 2020-01-13 DIAGNOSIS — Z20822 Contact with and (suspected) exposure to covid-19: Secondary | ICD-10-CM | POA: Diagnosis present

## 2020-01-13 DIAGNOSIS — M109 Gout, unspecified: Secondary | ICD-10-CM | POA: Diagnosis present

## 2020-01-13 DIAGNOSIS — I10 Essential (primary) hypertension: Secondary | ICD-10-CM | POA: Diagnosis present

## 2020-01-13 DIAGNOSIS — F32A Depression, unspecified: Secondary | ICD-10-CM | POA: Diagnosis present

## 2020-01-13 DIAGNOSIS — Z87891 Personal history of nicotine dependence: Secondary | ICD-10-CM | POA: Diagnosis not present

## 2020-01-13 DIAGNOSIS — E039 Hypothyroidism, unspecified: Secondary | ICD-10-CM | POA: Diagnosis present

## 2020-01-13 DIAGNOSIS — Z419 Encounter for procedure for purposes other than remedying health state, unspecified: Secondary | ICD-10-CM

## 2020-01-13 DIAGNOSIS — Z79899 Other long term (current) drug therapy: Secondary | ICD-10-CM | POA: Diagnosis not present

## 2020-01-13 DIAGNOSIS — Z7982 Long term (current) use of aspirin: Secondary | ICD-10-CM

## 2020-01-13 DIAGNOSIS — E785 Hyperlipidemia, unspecified: Secondary | ICD-10-CM | POA: Diagnosis present

## 2020-01-13 DIAGNOSIS — M4316 Spondylolisthesis, lumbar region: Secondary | ICD-10-CM | POA: Diagnosis present

## 2020-01-13 DIAGNOSIS — M4726 Other spondylosis with radiculopathy, lumbar region: Secondary | ICD-10-CM | POA: Diagnosis present

## 2020-01-13 LAB — CBC
HCT: 33.7 % — ABNORMAL LOW (ref 36.0–46.0)
Hemoglobin: 11.6 g/dL — ABNORMAL LOW (ref 12.0–15.0)
MCH: 31 pg (ref 26.0–34.0)
MCHC: 34.4 g/dL (ref 30.0–36.0)
MCV: 90.1 fL (ref 80.0–100.0)
Platelets: 145 10*3/uL — ABNORMAL LOW (ref 150–400)
RBC: 3.74 MIL/uL — ABNORMAL LOW (ref 3.87–5.11)
RDW: 13 % (ref 11.5–15.5)
WBC: 7.8 10*3/uL (ref 4.0–10.5)
nRBC: 0 % (ref 0.0–0.2)

## 2020-01-13 LAB — CBC WITH DIFFERENTIAL/PLATELET
Abs Immature Granulocytes: 0.03 10*3/uL (ref 0.00–0.07)
Basophils Absolute: 0 10*3/uL (ref 0.0–0.1)
Basophils Relative: 0 %
Eosinophils Absolute: 0 10*3/uL (ref 0.0–0.5)
Eosinophils Relative: 0 %
HCT: 23.4 % — ABNORMAL LOW (ref 36.0–46.0)
Hemoglobin: 7.8 g/dL — ABNORMAL LOW (ref 12.0–15.0)
Immature Granulocytes: 1 %
Lymphocytes Relative: 7 %
Lymphs Abs: 0.4 10*3/uL — ABNORMAL LOW (ref 0.7–4.0)
MCH: 31.1 pg (ref 26.0–34.0)
MCHC: 33.3 g/dL (ref 30.0–36.0)
MCV: 93.2 fL (ref 80.0–100.0)
Monocytes Absolute: 0.2 10*3/uL (ref 0.1–1.0)
Monocytes Relative: 3 %
Neutro Abs: 5.6 10*3/uL (ref 1.7–7.7)
Neutrophils Relative %: 89 %
Platelets: 112 10*3/uL — ABNORMAL LOW (ref 150–400)
RBC: 2.51 MIL/uL — ABNORMAL LOW (ref 3.87–5.11)
RDW: 13.2 % (ref 11.5–15.5)
WBC: 6.2 10*3/uL (ref 4.0–10.5)
nRBC: 0 % (ref 0.0–0.2)

## 2020-01-13 LAB — BASIC METABOLIC PANEL
Anion gap: 11 (ref 5–15)
Anion gap: 9 (ref 5–15)
BUN: 27 mg/dL — ABNORMAL HIGH (ref 8–23)
BUN: 30 mg/dL — ABNORMAL HIGH (ref 8–23)
CO2: 24 mmol/L (ref 22–32)
CO2: 24 mmol/L (ref 22–32)
Calcium: 8.9 mg/dL (ref 8.9–10.3)
Calcium: 8 mg/dL — ABNORMAL LOW (ref 8.9–10.3)
Chloride: 95 mmol/L — ABNORMAL LOW (ref 98–111)
Chloride: 101 mmol/L (ref 98–111)
Creatinine, Ser: 1.59 mg/dL — ABNORMAL HIGH (ref 0.44–1.00)
Creatinine, Ser: 1.45 mg/dL — ABNORMAL HIGH (ref 0.44–1.00)
GFR, Estimated: 34 mL/min — ABNORMAL LOW (ref >60)
GFR, Estimated: 38 mL/min — ABNORMAL LOW (ref >60)
Glucose, Bld: 155 mg/dL — ABNORMAL HIGH (ref 70–99)
Glucose, Bld: 214 mg/dL — ABNORMAL HIGH (ref 70–99)
Potassium: 3.2 mmol/L — ABNORMAL LOW (ref 3.5–5.1)
Potassium: 3.6 mmol/L (ref 3.5–5.1)
Sodium: 130 mmol/L — ABNORMAL LOW (ref 135–145)
Sodium: 134 mmol/L — ABNORMAL LOW (ref 135–145)

## 2020-01-13 LAB — URINALYSIS, ROUTINE W REFLEX MICROSCOPIC
Bilirubin Urine: NEGATIVE
Glucose, UA: NEGATIVE mg/dL
Ketones, ur: NEGATIVE mg/dL
Nitrite: NEGATIVE
Protein, ur: 100 mg/dL — AB
RBC / HPF: >50 RBC/hpf — ABNORMAL HIGH (ref 0–5)
Specific Gravity, Urine: 1.012 (ref 1.005–1.030)
WBC, UA: >50 WBC/hpf — ABNORMAL HIGH (ref 0–5)
pH: 5 (ref 5.0–8.0)

## 2020-01-13 LAB — PREPARE RBC (CROSSMATCH)

## 2020-01-13 LAB — ABO/RH: ABO/RH(D): A POS

## 2020-01-13 SURGERY — POSTERIOR LUMBAR FUSION 3 LEVEL
Anesthesia: General | Site: Back

## 2020-01-13 MED ORDER — SODIUM CHLORIDE 0.9 % IV SOLN: INTRAVENOUS | Status: DC | PRN

## 2020-01-13 MED ORDER — THROMBIN 20000 UNITS EX SOLR
CUTANEOUS | Status: AC
  Filled 2020-01-13: qty 20000

## 2020-01-13 MED ORDER — ALBUMIN HUMAN 5 % IV SOLN
INTRAVENOUS | Status: AC
  Administered 2020-01-13: 12.5 g via INTRAVENOUS
  Filled 2020-01-13: qty 250

## 2020-01-13 MED ORDER — OXYCODONE HCL 5 MG PO TABS
ORAL_TABLET | ORAL | Status: AC
  Administered 2020-01-13: 10 mg via ORAL
  Filled 2020-01-13: qty 2

## 2020-01-13 MED ORDER — ROCURONIUM BROMIDE 10 MG/ML (PF) SYRINGE
PREFILLED_SYRINGE | INTRAVENOUS | Status: AC
  Filled 2020-01-13: qty 20

## 2020-01-13 MED ORDER — THROMBIN 20000 UNITS EX SOLR
CUTANEOUS | Status: DC | PRN
  Administered 2020-01-13 (×3): 20 mL via TOPICAL

## 2020-01-13 MED ORDER — HYDROMORPHONE HCL 1 MG/ML IJ SOLN: 0.5000 mg | INTRAMUSCULAR | Status: DC | PRN

## 2020-01-13 MED ORDER — OXYCODONE HCL 5 MG PO TABS
10.0000 mg | ORAL_TABLET | ORAL | Status: DC | PRN
  Administered 2020-01-14 – 2020-01-20 (×18): 10 mg via ORAL
  Filled 2020-01-13 (×18): qty 2

## 2020-01-13 MED ORDER — DEXAMETHASONE SODIUM PHOSPHATE 10 MG/ML IJ SOLN
10.0000 mg | Freq: Once | INTRAMUSCULAR | Status: AC
  Administered 2020-01-13: 10 mg via INTRAVENOUS
  Filled 2020-01-13: qty 1

## 2020-01-13 MED ORDER — ACETAMINOPHEN 650 MG RE SUPP: 650.0000 mg | RECTAL | Status: DC | PRN

## 2020-01-13 MED ORDER — BUSPIRONE HCL 15 MG PO TABS
7.5000 mg | ORAL_TABLET | Freq: Two times a day (BID) | ORAL | Status: DC
  Administered 2020-01-13 – 2020-01-21 (×16): 7.5 mg via ORAL
  Filled 2020-01-13 (×3): qty 1
  Filled 2020-01-13: qty 2
  Filled 2020-01-13 (×3): qty 1
  Filled 2020-01-13 (×4): qty 2
  Filled 2020-01-13 (×3): qty 1
  Filled 2020-01-13: qty 2
  Filled 2020-01-13: qty 1
  Filled 2020-01-13 (×2): qty 2
  Filled 2020-01-13: qty 1
  Filled 2020-01-13 (×4): qty 2
  Filled 2020-01-13: qty 1
  Filled 2020-01-13 (×2): qty 2
  Filled 2020-01-13 (×5): qty 1

## 2020-01-13 MED ORDER — PROPOFOL 10 MG/ML IV BOLUS
INTRAVENOUS | Status: DC | PRN
  Administered 2020-01-13: 150 mg via INTRAVENOUS

## 2020-01-13 MED ORDER — SUGAMMADEX SODIUM 200 MG/2ML IV SOLN
INTRAVENOUS | Status: DC | PRN
  Administered 2020-01-13: 200 mg via INTRAVENOUS

## 2020-01-13 MED ORDER — LACTATED RINGERS IV SOLN: INTRAVENOUS | Status: DC

## 2020-01-13 MED ORDER — ONDANSETRON HCL 4 MG/2ML IJ SOLN
INTRAMUSCULAR | Status: DC | PRN
  Administered 2020-01-13: 4 mg via INTRAVENOUS

## 2020-01-13 MED ORDER — HYDROCODONE-ACETAMINOPHEN 10-325 MG PO TABS
ORAL_TABLET | ORAL | Status: AC
  Filled 2020-01-13: qty 1

## 2020-01-13 MED ORDER — CHLORHEXIDINE GLUCONATE CLOTH 2 % EX PADS: 6.0000 | MEDICATED_PAD | Freq: Once | CUTANEOUS | Status: DC

## 2020-01-13 MED ORDER — FENTANYL CITRATE (PF) 100 MCG/2ML IJ SOLN
INTRAMUSCULAR | Status: AC
  Administered 2020-01-13: 25 ug via INTRAVENOUS
  Filled 2020-01-13: qty 2

## 2020-01-13 MED ORDER — TRIAMTERENE-HCTZ 37.5-25 MG PO TABS
1.0000 | ORAL_TABLET | Freq: Every day | ORAL | Status: DC
  Administered 2020-01-14 – 2020-01-21 (×7): 1 via ORAL
  Filled 2020-01-13 (×8): qty 1

## 2020-01-13 MED ORDER — PHENYLEPHRINE 40 MCG/ML (10ML) SYRINGE FOR IV PUSH (FOR BLOOD PRESSURE SUPPORT)
PREFILLED_SYRINGE | INTRAVENOUS | Status: DC | PRN
  Administered 2020-01-13 (×3): 120 ug via INTRAVENOUS
  Administered 2020-01-13: 200 ug via INTRAVENOUS
  Administered 2020-01-13: 120 ug via INTRAVENOUS

## 2020-01-13 MED ORDER — ALBUMIN HUMAN 5 % IV SOLN: 12.5000 g | Freq: Once | INTRAVENOUS | Status: AC

## 2020-01-13 MED ORDER — CEFAZOLIN SODIUM-DEXTROSE 2-4 GM/100ML-% IV SOLN
2.0000 g | INTRAVENOUS | Status: AC
  Administered 2020-01-13 (×2): 2 g via INTRAVENOUS
  Filled 2020-01-13: qty 100

## 2020-01-13 MED ORDER — ALUM & MAG HYDROXIDE-SIMETH 200-200-20 MG/5ML PO SUSP: 30.0000 mL | Freq: Four times a day (QID) | ORAL | Status: DC | PRN

## 2020-01-13 MED ORDER — LIDOCAINE-EPINEPHRINE 1 %-1:100000 IJ SOLN
INTRAMUSCULAR | Status: AC
  Filled 2020-01-13: qty 1

## 2020-01-13 MED ORDER — LEVOTHYROXINE SODIUM 75 MCG PO TABS
75.0000 ug | ORAL_TABLET | Freq: Every day | ORAL | Status: DC
  Administered 2020-01-14 – 2020-01-21 (×8): 75 ug via ORAL
  Filled 2020-01-13 (×8): qty 1

## 2020-01-13 MED ORDER — ONDANSETRON HCL 4 MG/2ML IJ SOLN: 4.0000 mg | Freq: Once | INTRAMUSCULAR | Status: DC | PRN

## 2020-01-13 MED ORDER — CHLORHEXIDINE GLUCONATE 0.12 % MT SOLN
15.0000 mL | Freq: Once | OROMUCOSAL | Status: AC
  Administered 2020-01-13: 15 mL via OROMUCOSAL
  Filled 2020-01-13: qty 15

## 2020-01-13 MED ORDER — SODIUM CHLORIDE 0.9% FLUSH
3.0000 mL | Freq: Two times a day (BID) | INTRAVENOUS | Status: DC
  Administered 2020-01-14 – 2020-01-21 (×11): 3 mL via INTRAVENOUS

## 2020-01-13 MED ORDER — HYDROCODONE-ACETAMINOPHEN 5-325 MG PO TABS
ORAL_TABLET | ORAL | Status: AC
  Filled 2020-01-13: qty 1

## 2020-01-13 MED ORDER — CHLORTHALIDONE 25 MG PO TABS
12.5000 mg | ORAL_TABLET | Freq: Every morning | ORAL | Status: DC
  Administered 2020-01-14 – 2020-01-21 (×7): 12.5 mg via ORAL
  Filled 2020-01-13 (×8): qty 0.5

## 2020-01-13 MED ORDER — ACETAMINOPHEN 500 MG PO TABS
1000.0000 mg | ORAL_TABLET | Freq: Once | ORAL | Status: AC
  Administered 2020-01-13: 1000 mg via ORAL
  Filled 2020-01-13: qty 2

## 2020-01-13 MED ORDER — ONDANSETRON HCL 4 MG/2ML IJ SOLN: 4.0000 mg | Freq: Four times a day (QID) | INTRAMUSCULAR | Status: DC | PRN

## 2020-01-13 MED ORDER — FENTANYL CITRATE (PF) 250 MCG/5ML IJ SOLN
INTRAMUSCULAR | Status: AC
  Filled 2020-01-13: qty 5

## 2020-01-13 MED ORDER — PHENOL 1.4 % MT LIQD: 1.0000 | OROMUCOSAL | Status: DC | PRN

## 2020-01-13 MED ORDER — PANTOPRAZOLE SODIUM 40 MG IV SOLR
40.0000 mg | Freq: Every day | INTRAVENOUS | Status: DC
  Administered 2020-01-13: 40 mg via INTRAVENOUS
  Filled 2020-01-13: qty 40

## 2020-01-13 MED ORDER — CYCLOBENZAPRINE HCL 10 MG PO TABS
ORAL_TABLET | ORAL | Status: AC
  Administered 2020-01-13: 10 mg via ORAL
  Filled 2020-01-13: qty 1

## 2020-01-13 MED ORDER — KETAMINE HCL 10 MG/ML IJ SOLN
INTRAMUSCULAR | Status: DC | PRN
  Administered 2020-01-13: 20 mg via INTRAVENOUS

## 2020-01-13 MED ORDER — ROSUVASTATIN CALCIUM 5 MG PO TABS
10.0000 mg | ORAL_TABLET | Freq: Every day | ORAL | Status: DC
  Administered 2020-01-14 – 2020-01-20 (×7): 10 mg via ORAL
  Filled 2020-01-13 (×7): qty 2

## 2020-01-13 MED ORDER — MENTHOL 3 MG MT LOZG: 1.0000 | LOZENGE | OROMUCOSAL | Status: DC | PRN

## 2020-01-13 MED ORDER — ONDANSETRON HCL 4 MG/2ML IJ SOLN
INTRAMUSCULAR | Status: AC
  Filled 2020-01-13: qty 2

## 2020-01-13 MED ORDER — KETAMINE HCL 50 MG/5ML IJ SOSY
PREFILLED_SYRINGE | INTRAMUSCULAR | Status: AC
  Filled 2020-01-13: qty 5

## 2020-01-13 MED ORDER — AMLODIPINE BESYLATE 2.5 MG PO TABS
2.5000 mg | ORAL_TABLET | Freq: Every day | ORAL | Status: DC
  Administered 2020-01-14 – 2020-01-21 (×7): 2.5 mg via ORAL
  Filled 2020-01-13 (×7): qty 1

## 2020-01-13 MED ORDER — METOPROLOL SUCCINATE ER 50 MG PO TB24
50.0000 mg | ORAL_TABLET | Freq: Every day | ORAL | Status: DC
  Administered 2020-01-14 – 2020-01-21 (×7): 50 mg via ORAL
  Filled 2020-01-13 (×7): qty 1

## 2020-01-13 MED ORDER — BUPIVACAINE LIPOSOME 1.3 % IJ SUSP
20.0000 mL | INTRAMUSCULAR | Status: AC
  Administered 2020-01-13: 20 mL
  Filled 2020-01-13: qty 20

## 2020-01-13 MED ORDER — ALLOPURINOL 100 MG PO TABS
100.0000 mg | ORAL_TABLET | Freq: Every day | ORAL | Status: DC
  Administered 2020-01-14 – 2020-01-21 (×8): 100 mg via ORAL
  Filled 2020-01-13 (×8): qty 1

## 2020-01-13 MED ORDER — FENTANYL CITRATE (PF) 100 MCG/2ML IJ SOLN
25.0000 ug | INTRAMUSCULAR | Status: DC | PRN
  Administered 2020-01-13 (×3): 25 ug via INTRAVENOUS
  Administered 2020-01-13: 50 ug via INTRAVENOUS

## 2020-01-13 MED ORDER — ONDANSETRON HCL 4 MG PO TABS: 4.0000 mg | ORAL_TABLET | Freq: Four times a day (QID) | ORAL | Status: DC | PRN

## 2020-01-13 MED ORDER — PROPOFOL 10 MG/ML IV BOLUS
INTRAVENOUS | Status: AC
  Filled 2020-01-13: qty 40

## 2020-01-13 MED ORDER — SODIUM CHLORIDE 0.9 % IV SOLN
250.0000 mL | INTRAVENOUS | Status: DC
  Administered 2020-01-13: 250 mL via INTRAVENOUS

## 2020-01-13 MED ORDER — PHENYLEPHRINE HCL-NACL 10-0.9 MG/250ML-% IV SOLN
INTRAVENOUS | Status: DC | PRN
  Administered 2020-01-13: 20 ug/min via INTRAVENOUS

## 2020-01-13 MED ORDER — DEXMEDETOMIDINE (PRECEDEX) IN NS 20 MCG/5ML (4 MCG/ML) IV SYRINGE
PREFILLED_SYRINGE | INTRAVENOUS | Status: DC | PRN
  Administered 2020-01-13 (×2): 10 ug via INTRAVENOUS

## 2020-01-13 MED ORDER — ROCURONIUM BROMIDE 10 MG/ML (PF) SYRINGE
PREFILLED_SYRINGE | INTRAVENOUS | Status: AC
  Filled 2020-01-13: qty 10

## 2020-01-13 MED ORDER — 0.9 % SODIUM CHLORIDE (POUR BTL) OPTIME
TOPICAL | Status: DC | PRN
  Administered 2020-01-13: 1000 mL

## 2020-01-13 MED ORDER — ORAL CARE MOUTH RINSE: 15.0000 mL | Freq: Once | OROMUCOSAL | Status: AC

## 2020-01-13 MED ORDER — DEXAMETHASONE SODIUM PHOSPHATE 10 MG/ML IJ SOLN
INTRAMUSCULAR | Status: AC
  Filled 2020-01-13: qty 1

## 2020-01-13 MED ORDER — LIDOCAINE 2% (20 MG/ML) 5 ML SYRINGE
INTRAMUSCULAR | Status: DC | PRN
  Administered 2020-01-13: 60 mg via INTRAVENOUS

## 2020-01-13 MED ORDER — LACTATED RINGERS IV SOLN: INTRAVENOUS | Status: DC | PRN

## 2020-01-13 MED ORDER — SUCCINYLCHOLINE CHLORIDE 200 MG/10ML IV SOSY
PREFILLED_SYRINGE | INTRAVENOUS | Status: AC
  Filled 2020-01-13: qty 10

## 2020-01-13 MED ORDER — FENTANYL CITRATE (PF) 100 MCG/2ML IJ SOLN
INTRAMUSCULAR | Status: DC | PRN
  Administered 2020-01-13: 50 ug via INTRAVENOUS
  Administered 2020-01-13: 150 ug via INTRAVENOUS
  Administered 2020-01-13: 50 ug via INTRAVENOUS

## 2020-01-13 MED ORDER — FENTANYL CITRATE (PF) 100 MCG/2ML IJ SOLN
INTRAMUSCULAR | Status: AC
  Filled 2020-01-13: qty 2

## 2020-01-13 MED ORDER — PREGABALIN 100 MG PO CAPS
300.0000 mg | ORAL_CAPSULE | Freq: Two times a day (BID) | ORAL | Status: DC
  Administered 2020-01-13 – 2020-01-21 (×16): 300 mg via ORAL
  Filled 2020-01-13 (×3): qty 3
  Filled 2020-01-13: qty 6
  Filled 2020-01-13 (×13): qty 3

## 2020-01-13 MED ORDER — METOPROLOL SUCCINATE ER 25 MG PO TB24
50.0000 mg | ORAL_TABLET | Freq: Once | ORAL | Status: AC
  Administered 2020-01-13: 50 mg via ORAL
  Filled 2020-01-13: qty 2

## 2020-01-13 MED ORDER — CYCLOBENZAPRINE HCL 10 MG PO TABS
10.0000 mg | ORAL_TABLET | Freq: Three times a day (TID) | ORAL | Status: DC | PRN
  Administered 2020-01-14 – 2020-01-17 (×4): 10 mg via ORAL
  Filled 2020-01-13 (×4): qty 1

## 2020-01-13 MED ORDER — ROCURONIUM BROMIDE 10 MG/ML (PF) SYRINGE
PREFILLED_SYRINGE | INTRAVENOUS | Status: DC | PRN
  Administered 2020-01-13: 10 mg via INTRAVENOUS
  Administered 2020-01-13 (×2): 20 mg via INTRAVENOUS
  Administered 2020-01-13: 60 mg via INTRAVENOUS
  Administered 2020-01-13: 20 mg via INTRAVENOUS
  Administered 2020-01-13: 10 mg via INTRAVENOUS

## 2020-01-13 MED ORDER — EPHEDRINE 5 MG/ML INJ
INTRAVENOUS | Status: AC
  Filled 2020-01-13: qty 10

## 2020-01-13 MED ORDER — ALBUMIN HUMAN 5 % IV SOLN: INTRAVENOUS | Status: DC | PRN

## 2020-01-13 MED ORDER — ACETAMINOPHEN 325 MG PO TABS
650.0000 mg | ORAL_TABLET | ORAL | Status: DC | PRN
  Administered 2020-01-15 – 2020-01-16 (×3): 650 mg via ORAL
  Filled 2020-01-13 (×3): qty 2

## 2020-01-13 MED ORDER — LOSARTAN POTASSIUM 50 MG PO TABS
100.0000 mg | ORAL_TABLET | Freq: Every day | ORAL | Status: DC
  Administered 2020-01-14 – 2020-01-21 (×7): 100 mg via ORAL
  Filled 2020-01-13 (×7): qty 2

## 2020-01-13 MED ORDER — SODIUM CHLORIDE 0.9% IV SOLUTION: Freq: Once | INTRAVENOUS | Status: DC

## 2020-01-13 MED ORDER — PHENYLEPHRINE 40 MCG/ML (10ML) SYRINGE FOR IV PUSH (FOR BLOOD PRESSURE SUPPORT)
PREFILLED_SYRINGE | INTRAVENOUS | Status: AC
  Filled 2020-01-13: qty 10

## 2020-01-13 MED ORDER — HYDROCODONE-ACETAMINOPHEN 10-325 MG PO TABS
0.5000 | ORAL_TABLET | Freq: Four times a day (QID) | ORAL | Status: DC | PRN
  Administered 2020-01-13 – 2020-01-15 (×3): 0.5 via ORAL
  Filled 2020-01-13 (×4): qty 1

## 2020-01-13 MED ORDER — LIDOCAINE 2% (20 MG/ML) 5 ML SYRINGE
INTRAMUSCULAR | Status: AC
  Filled 2020-01-13: qty 5

## 2020-01-13 MED ORDER — SODIUM CHLORIDE (PF) 0.9 % IJ SOLN
INTRAMUSCULAR | Status: AC
  Filled 2020-01-13: qty 10

## 2020-01-13 MED ORDER — LIDOCAINE-EPINEPHRINE 1 %-1:100000 IJ SOLN
INTRAMUSCULAR | Status: DC | PRN
  Administered 2020-01-13: 10 mL

## 2020-01-13 MED ORDER — SODIUM CHLORIDE 0.9% FLUSH: 3.0000 mL | INTRAVENOUS | Status: DC | PRN

## 2020-01-13 MED ORDER — CEFAZOLIN SODIUM-DEXTROSE 2-4 GM/100ML-% IV SOLN
2.0000 g | Freq: Three times a day (TID) | INTRAVENOUS | Status: AC
  Administered 2020-01-13 – 2020-01-15 (×6): 2 g via INTRAVENOUS
  Filled 2020-01-13 (×6): qty 100

## 2020-01-13 SURGICAL SUPPLY — 77 items
BASKET BONE COLLECTION (BASKET) ×2 IMPLANT
BENZOIN TINCTURE PRP APPL 2/3 (GAUZE/BANDAGES/DRESSINGS) ×2 IMPLANT
BLADE CLIPPER SURG (BLADE) IMPLANT
BLADE SURG 11 STRL SS (BLADE) ×2 IMPLANT
BONE VIVIGEN FORMABLE 5.4CC (Bone Implant) ×2 IMPLANT
BUR CUTTER 7.0 ROUND (BURR) ×2 IMPLANT
BUR MATCHSTICK NEURO 3.0 LAGG (BURR) ×2 IMPLANT
CANISTER SUCT 3000ML PPV (MISCELLANEOUS) ×2 IMPLANT
CAP LOCKING THREADED (Cap) ×16 IMPLANT
CARTRIDGE OIL MAESTRO DRILL (MISCELLANEOUS) ×1 IMPLANT
CNTNR URN SCR LID CUP LEK RST (MISCELLANEOUS) ×1 IMPLANT
CONT SPEC 4OZ STRL OR WHT (MISCELLANEOUS) ×2
COVER BACK TABLE 60X90IN (DRAPES) ×2 IMPLANT
COVER WAND RF STERILE (DRAPES) ×2 IMPLANT
CROSSLINK SPINAL FUSION (Cage) ×2 IMPLANT
DECANTER SPIKE VIAL GLASS SM (MISCELLANEOUS) ×2 IMPLANT
DERMABOND ADHESIVE PROPEN (GAUZE/BANDAGES/DRESSINGS) ×1
DERMABOND ADVANCED (GAUZE/BANDAGES/DRESSINGS) ×1
DERMABOND ADVANCED .7 DNX12 (GAUZE/BANDAGES/DRESSINGS) ×1 IMPLANT
DERMABOND ADVANCED .7 DNX6 (GAUZE/BANDAGES/DRESSINGS) ×1 IMPLANT
DIFFUSER DRILL AIR PNEUMATIC (MISCELLANEOUS) ×2 IMPLANT
DRAPE C-ARM 42X72 X-RAY (DRAPES) ×2 IMPLANT
DRAPE C-ARMOR (DRAPES) ×2 IMPLANT
DRAPE HALF SHEET 40X57 (DRAPES) ×4 IMPLANT
DRAPE LAPAROTOMY 100X72X124 (DRAPES) ×2 IMPLANT
DRAPE SURG 17X23 STRL (DRAPES) ×2 IMPLANT
DRSG OPSITE 4X5.5 SM (GAUZE/BANDAGES/DRESSINGS) ×2 IMPLANT
DRSG OPSITE POSTOP 4X8 (GAUZE/BANDAGES/DRESSINGS) ×2 IMPLANT
DURAPREP 26ML APPLICATOR (WOUND CARE) ×2 IMPLANT
ELECT REM PT RETURN 9FT ADLT (ELECTROSURGICAL) ×2
ELECTRODE REM PT RTRN 9FT ADLT (ELECTROSURGICAL) ×1 IMPLANT
EVACUATOR 3/16  PVC DRAIN (DRAIN) ×4
EVACUATOR 3/16 PVC DRAIN (DRAIN) ×2 IMPLANT
GAUZE 4X4 16PLY RFD (DISPOSABLE) ×2 IMPLANT
GAUZE SPONGE 4X4 12PLY STRL (GAUZE/BANDAGES/DRESSINGS) ×2 IMPLANT
GLOVE BIO SURGEON STRL SZ7 (GLOVE) IMPLANT
GLOVE BIO SURGEON STRL SZ8 (GLOVE) ×4 IMPLANT
GLOVE BIOGEL PI IND STRL 7.0 (GLOVE) IMPLANT
GLOVE BIOGEL PI INDICATOR 7.0 (GLOVE)
GLOVE EXAM NITRILE XL STR (GLOVE) IMPLANT
GLOVE INDICATOR 8.5 STRL (GLOVE) ×4 IMPLANT
GOWN STRL REUS W/ TWL LRG LVL3 (GOWN DISPOSABLE) IMPLANT
GOWN STRL REUS W/ TWL XL LVL3 (GOWN DISPOSABLE) ×2 IMPLANT
GOWN STRL REUS W/TWL 2XL LVL3 (GOWN DISPOSABLE) IMPLANT
GOWN STRL REUS W/TWL LRG LVL3 (GOWN DISPOSABLE)
GOWN STRL REUS W/TWL XL LVL3 (GOWN DISPOSABLE) ×4
GRAFT BONE PROTEIOS LRG 5CC (Orthopedic Implant) ×2 IMPLANT
HEMOSTAT POWDER KIT SURGIFOAM (HEMOSTASIS) IMPLANT
KIT BASIN OR (CUSTOM PROCEDURE TRAY) ×2 IMPLANT
KIT POSITION SURG JACKSON T1 (MISCELLANEOUS) ×2 IMPLANT
KIT TURNOVER KIT B (KITS) ×2 IMPLANT
MILL MEDIUM DISP (BLADE) ×2 IMPLANT
NEEDLE HYPO 21X1.5 SAFETY (NEEDLE) ×2 IMPLANT
NEEDLE HYPO 25X1 1.5 SAFETY (NEEDLE) ×2 IMPLANT
NS IRRIG 1000ML POUR BTL (IV SOLUTION) ×4 IMPLANT
OIL CARTRIDGE MAESTRO DRILL (MISCELLANEOUS) ×2
PACK LAMINECTOMY NEURO (CUSTOM PROCEDURE TRAY) ×2 IMPLANT
PAD ARMBOARD 7.5X6 YLW CONV (MISCELLANEOUS) ×6 IMPLANT
PATTIES SURGICAL 1X1 (DISPOSABLE) ×4 IMPLANT
ROD CREO 125MM SPINAL (Rod) ×4 IMPLANT
SCREW 5.5X40MM (Screw) ×4 IMPLANT
SCREW CREO 65X40M SPINAL (Screw) ×12 IMPLANT
SPACER SUSTAIN RT 12 15D 9X26 (Spacer) ×4 IMPLANT
SPACER SUSTAIN RT 12 8D 9X26 (Spacer) ×4 IMPLANT
SPACER SUSTAIN TI 8X26X11 8D (Spacer) ×4 IMPLANT
SPONGE LAP 4X18 RFD (DISPOSABLE) IMPLANT
SPONGE SURGIFOAM ABS GEL 100 (HEMOSTASIS) ×6 IMPLANT
STRIP CLOSURE SKIN 1/2X4 (GAUZE/BANDAGES/DRESSINGS) ×2 IMPLANT
SUT VIC AB 0 CT1 18XCR BRD8 (SUTURE) ×2 IMPLANT
SUT VIC AB 0 CT1 8-18 (SUTURE) ×4
SUT VIC AB 2-0 CT1 18 (SUTURE) ×4 IMPLANT
SUT VIC AB 4-0 PS2 27 (SUTURE) ×2 IMPLANT
SYR 20ML LL LF (SYRINGE) ×2 IMPLANT
TOWEL GREEN STERILE (TOWEL DISPOSABLE) ×2 IMPLANT
TOWEL GREEN STERILE FF (TOWEL DISPOSABLE) ×2 IMPLANT
TRAY FOLEY MTR SLVR 16FR STAT (SET/KITS/TRAYS/PACK) ×2 IMPLANT
WATER STERILE IRR 1000ML POUR (IV SOLUTION) ×2 IMPLANT

## 2020-01-13 NOTE — Transfer of Care (Signed)
Immediate Anesthesia Transfer of Care Note  Patient: Patricia Parks  Procedure(s) Performed: Posterior Lumbar Interbody Fusion - Lumbar Two-Lumbar Three - Lumbar Three-Lumbar Four - Lumbar Four- Lumbar Five - Posterior Lateral and Interbody fusion (N/A Back)  Patient Location: PACU  Anesthesia Type:General  Level of Consciousness: awake and patient cooperative  Airway & Oxygen Therapy: Patient Spontanous Breathing and Patient connected to face mask oxygen  Post-op Assessment: Report given to RN, Post -op Vital signs reviewed and stable and Patient moving all extremities X 4  Post vital signs: Reviewed and stable  Last Vitals:  Vitals Value Taken Time  BP 93/63 01/13/20 1519  Temp    Pulse 76 01/13/20 1522  Resp 14 01/13/20 1522  SpO2 96 % 01/13/20 1522  Vitals shown include unvalidated device data.  Last Pain:  Vitals:   01/13/20 0738  TempSrc:   PainSc: 5       Patients Stated Pain Goal: 2 (20/81/38 8719)  Complications: No complications documented.

## 2020-01-13 NOTE — H&P (Signed)
Patricia Parks is an 74 y.o. female.   Chief Complaint: Back and right greater than left leg pain HPI: 74 year old female with back bilateral hip and leg pain rating down L3-L4 and L5 nerve root pattern she also has neurogenic claudication.  Has been going on for years and progressively worsening she is failed all forms conservative treatment imaging showed severe spinal stenosis at L2 33445 with degenerative spondylolisthesis.  Due to patient's progression of clinical syndrome imaging findings and failed conservative treatment I recommended decompressive laminectomy interbody fusions at those levels.  I have extensively gone over the risks and benefits of that operation with her as well as perioperative course expectations of outcome and alternatives of surgery and she understands and agrees to proceed forward.  Past Medical History:  Diagnosis Date  . Anxiety   . Depression   . Fibromyalgia   . GERD (gastroesophageal reflux disease)   . Gout 04/2017  . Hyperlipidemia   . Hypertension   . Hypothyroidism       . Low kidney function    followed by Dr Alyson Ingles  . Spinal stenosis     Past Surgical History:  Procedure Laterality Date  . Carpel Tunnel Surgery Right 10/17/2018  . CATARACT EXTRACTION W/ INTRAOCULAR LENS IMPLANT Bilateral 10/25/2015   Right eye was first.  Left eye done 10/17.  . CERVICAL BIOPSY  W/ LOOP ELECTRODE EXCISION  12/2008   hx CIN II    Family History  Problem Relation Age of Onset  . Osteoporosis Mother   . Hypertension Mother   . Uterine cancer Mother   . Heart disease Mother        Atrial fibrillation  . Breast cancer Sister   . Osteoporosis Sister   . Diabetes Sister   . Diabetes Father   . Heart disease Sister    Social History:  reports that she quit smoking about 46 years ago. Her smoking use included cigarettes. She has a 1.25 pack-year smoking history. She has never used smokeless tobacco. She reports current alcohol use of about 1.0 standard  drink of alcohol per week. She reports that she does not use drugs.  Allergies: No Known Allergies  Medications Prior to Admission  Medication Sig Dispense Refill  . allopurinol (ZYLOPRIM) 100 MG tablet Take 100 mg by mouth daily.    Marland Kitchen amLODipine (NORVASC) 2.5 MG tablet Take 2.5 mg by mouth daily.    . busPIRone (BUSPAR) 7.5 MG tablet Take 7.5 mg by mouth 2 (two) times daily.    . chlorthalidone (HYGROTON) 25 MG tablet Take 0.5 tablets (12.5 mg total) by mouth every morning. Needs to schedule appt for further refills. (Patient taking differently: Take 12.5 mg by mouth every morning. ) 45 tablet 0  . HYDROcodone-acetaminophen (NORCO) 10-325 MG tablet Take 0.5 tablets by mouth every 6 (six) hours as needed for moderate pain.     Marland Kitchen levothyroxine (SYNTHROID) 75 MCG tablet Take 75 mcg by mouth daily before breakfast.    . losartan (COZAAR) 100 MG tablet TAKE 1 TABLET BY MOUTH EVERY DAY (Patient taking differently: Take 100 mg by mouth daily. ) 90 tablet 3  . LYRICA 300 MG capsule Take 300 mg by mouth 2 (two) times daily.     . metoprolol succinate (TOPROL-XL) 50 MG 24 hr tablet Take 50 mg by mouth daily. Take with or immediately following a meal.    . rosuvastatin (CRESTOR) 10 MG tablet TAKE 1 TABLET BY MOUTH EVERY DAY (Patient taking differently: Take 10  mg by mouth daily. ) 90 tablet 1  . aspirin EC 81 MG EC tablet Take 1 tablet (81 mg total) by mouth daily. (Patient not taking: Reported on 01/07/2020) 30 tablet 1  . diclofenac Sodium (VOLTAREN) 1 % GEL Apply as directed (Patient not taking: Reported on 01/07/2020)    . furosemide (LASIX) 20 MG tablet Take 40 mg by mouth daily. (Patient not taking: Reported on 01/07/2020)    . meloxicam (MOBIC) 15 MG tablet Take 1 tablet (15 mg total) by mouth daily. (Patient not taking: Reported on 01/07/2020) 90 tablet 3  . phentermine 30 MG capsule Take 1 capsule (30 mg total) by mouth every morning. (Patient not taking: Reported on 01/07/2020) 30 capsule 0  .  triamterene-hydrochlorothiazide (MAXZIDE-25) 37.5-25 MG tablet Take 1 tablet by mouth daily.      Results for orders placed or performed during the hospital encounter of 01/13/20 (from the past 48 hour(s))  Type and screen [place]     Status: None (Preliminary result)   Collection Time: 01/13/20  7:20 AM  Result Value Ref Range   ABO/RH(D) PENDING    Antibody Screen PENDING    Sample Expiration      01/16/2020,2359 Performed at [facility], [place] [address] [place],  [address]    No results found.  Review of Systems  Musculoskeletal: Positive for back pain.  Neurological: Positive for numbness.    Blood pressure (!) 110/57, pulse 90, temperature (!) 100.6 F (38.1 C), temperature source Oral, resp. rate 20, height 5\' 5"  (1.651 m), weight 108.9 kg, last menstrual period 03/05/2000, SpO2 94 %. Physical Exam HENT:     Head: Normocephalic.  Eyes:     Pupils: Pupils are equal, round, and reactive to light.  Cardiovascular:     Rate and Rhythm: Normal rate.  Pulmonary:     Effort: Pulmonary effort is normal.  Abdominal:     General: Abdomen is flat.  Musculoskeletal:        General: Normal range of motion.     Cervical back: Normal range of motion.  Skin:    General: Skin is warm.  Neurological:     Mental Status: She is alert.     Comments: Patient is awake and alert strength 5 out of 5 iliopsoas, quads, hamstrings, gastroc, tibialis, and EHL.      Assessment/Plan 74 year old presents for decompressive laminectomy interbody fusions L2-L5  Elaina Hoops, MD 01/13/2020, 7:49 AM

## 2020-01-13 NOTE — Anesthesia Procedure Notes (Signed)
Procedure Name: Intubation Date/Time: 01/13/2020 8:38 AM Performed by: Lowella Dell, CRNA Pre-anesthesia Checklist: Patient identified, Emergency Drugs available, Suction available and Patient being monitored Patient Re-evaluated:Patient Re-evaluated prior to induction Oxygen Delivery Method: Circle System Utilized Preoxygenation: Pre-oxygenation with 100% oxygen Induction Type: IV induction Ventilation: Mask ventilation without difficulty Laryngoscope Size: Mac and 3 Grade View: Grade I Tube type: Oral Tube size: 7.0 mm Number of attempts: 1 Airway Equipment and Method: Stylet Placement Confirmation: ETT inserted through vocal cords under direct vision,  positive ETCO2 and breath sounds checked- equal and bilateral Secured at: 21 cm Tube secured with: Tape Dental Injury: Teeth and Oropharynx as per pre-operative assessment

## 2020-01-13 NOTE — Anesthesia Postprocedure Evaluation (Signed)
Anesthesia Post Note  Patient: Patricia Parks  Procedure(s) Performed: Posterior Lumbar Interbody Fusion - Lumbar Two-Lumbar Three - Lumbar Three-Lumbar Four - Lumbar Four- Lumbar Five - Posterior Lateral and Interbody fusion (N/A Back)     Patient location during evaluation: PACU Anesthesia Type: General Level of consciousness: awake Pain management: pain level controlled Vital Signs Assessment: post-procedure vital signs reviewed and stable Respiratory status: spontaneous breathing, nonlabored ventilation, respiratory function stable and patient connected to nasal cannula oxygen Cardiovascular status: blood pressure returned to baseline and stable Postop Assessment: no apparent nausea or vomiting Anesthetic complications: no Comments: Patient with decreased blood pressure in PACU despite administration of crystalloid, colloid, and cell saver intra-operatively. Additional albumin and IV fluids given in PACU. Will check CBC.     No complications documented.  Last Vitals:  Vitals:   01/13/20 1920 01/13/20 1935  BP: (!) 95/56 (!) 93/55  Pulse: 69 69  Resp: 14 13  Temp:    SpO2: 96% 95%    Last Pain:  Vitals:   01/13/20 2005  TempSrc:   PainSc: Asleep                 Mahkai Fangman P Dalia Jollie

## 2020-01-13 NOTE — Op Note (Signed)
Preoperative diagnosis: Severe lumbar spinal stenosis L2-3 L3-4 L4-5 with grade 1 spondylolisthesis L3-4 L4-5  Postoperative diagnosis: Same  Procedure: #1 complete decompressive lumbar laminectomies with complete medial facetectomies and radical foraminotomies of L2-3, L3-4 L4-5 in excess and requiring more work than would be needed with a standard interbody fusion  2.  Posterior lumbar interbody fusions L2-3 L3-4 L4-5 utilizing globus insert and rotate titanium cages packed with locally harvested autograft mixed with vivigen protios  3.  Pedicle screw fixation L2-L5 utilizing the globus Creo pedicle screw set  4.  Posterior lateral arthrodesis L2-L5 utilizing locally harvested autograft mixed  5.  Open reduction spinal deformity L3-4 L4-5  Surgeon: Dominica Severin Ross Bender  Assistant: Duffy Rhody  Assistant: Nash Shearer  Anesthesia: General  EBL: 1 L given back to 50 of Cell Saver  HPI: 74 year old female with progressive worsening back bilateral hip and leg pain going on now for the last several months and years.  Work-up is revealed severe spinal stenosis with severe thecal sac compression L2-3 L3-4 L4-5.  Patient failed all forms conservative treatment anti-inflammatories physical therapy epidural steroid injections.  And due to progression of disease failed conservative treatment and progression of clinical syndrome I recommended decompression fusion at those 3 levels.  I extensively reviewed the risks and benefits of the operation with her as well as perioperative course expectations of outcome and alternatives of surgery and she understood and agreed to proceed forward.  Operative procedure: Patient brought to the OR was due to general anesthesia positioned prone on the [facility] table her back was prepped and draped in routine sterile fashion utilizing anatomic landmarks and a midline incision was drawn out and infiltrated with 10 cc lidocaine with epi and Bovie electrocautery was used take  down the subcutaneous tissue and subperiosteal dissection was carried out on the lamina of L2, L3, L4, and L5 bilaterally.  Intraoperative x-ray identification appropriate level I exposed the TPs from L2-L5 and then the spinous processes at L2-L3 and L4 removed there was marked facet hypertrophy and ligamentous hypertrophy and there was very little interlaminar distance.  So the central lamina was drilled out with a high-speed drill laminotomy was begun centrally with a 3 Miller Kerrison punch and marching laterally complete medial facetectomies were performed with aggressive undermining the superior tickly facet at all level and aggressive foraminotomies were performed of the L2, L3, L4, and L5 nerve roots bilaterally.  After adequate central decompression been begun disc base were coagulated and incised first working at L2-3 utilizing sequential distraction the disc base was cleaned out endplates.  And the cages were inserted sequentially with extensive mount of autograft mix centrally.  This was done at all 3 levels L2-3 L3-4 L4-5 this significantly reduced the deformity and a spondylolisthesis at L3-4 and L4-5.  In addition the complete collapse and bone-on-bone at L3-4 disc base height was restored.  Then fluoroscopy was used the step along the way for the cage placement in addition to each step along the way for screw placement I placed pedicle screws at L2 down to L5 with five 5 x 40 screws inserted at L2 bilaterally and six 5 x 40's at L3, L4, and L5 bilaterally.  Then aggressive decortication was carried along the TPs from L2-L5 the remainder the locally harvested autograft mix was packed posterior laterally along the TPs and lateral facet complexes from L2-L5 bilaterally.  Then 2 rods were placed anchored the knots down on L5 compressed L4 against L5 L3 against L4 and L2 against  L3.  All the foramina were then reinspected to confirm patency Gelfoam was ON top of the dura the large Hemovac drain was placed  and Exparel was injected the fascia.  Then the wound was closed in layers with interrupted Vicryl in a running 4 subcuticular Dermabond benzoin Steri-Strips and a sterile dressing was applied patient recovery in stable condition.  At the end the case all needle counts and sponge counts were correct.

## 2020-01-13 NOTE — Progress Notes (Signed)
Husband Barnabas Lister called and reminded to bring back brace to hospital 01/14/20.  Patient spoke with husband on the phone.  Patient resting comfortably, no complaints.  Will continue to monitor.

## 2020-01-13 NOTE — Progress Notes (Signed)
0.5 tablet hydrocodone-acetaminophen (NORCO) wasted.  Witnessed by Veneta Penton RN.

## 2020-01-14 LAB — BPAM RBC
Blood Product Expiration Date: 202112032359
Blood Product Expiration Date: 202112032359
ISSUE DATE / TIME: 202111102131
ISSUE DATE / TIME: 202111102347
Unit Type and Rh: 6200
Unit Type and Rh: 6200

## 2020-01-14 LAB — TYPE AND SCREEN
ABO/RH(D): A POS
Antibody Screen: NEGATIVE
Unit division: 0
Unit division: 0

## 2020-01-14 LAB — HEMOGLOBIN AND HEMATOCRIT, BLOOD
HCT: 26.7 % — ABNORMAL LOW (ref 36.0–46.0)
Hemoglobin: 9.1 g/dL — ABNORMAL LOW (ref 12.0–15.0)

## 2020-01-14 MED ORDER — PANTOPRAZOLE SODIUM 40 MG PO TBEC
40.0000 mg | DELAYED_RELEASE_TABLET | Freq: Every day | ORAL | Status: DC
  Administered 2020-01-14: 40 mg via ORAL
  Filled 2020-01-14: qty 1

## 2020-01-14 MED FILL — Thrombin For Soln 20000 Unit: CUTANEOUS | Qty: 1 | Status: AC

## 2020-01-14 NOTE — Evaluation (Signed)
Occupational Therapy Evaluation Patient Details Name: Patricia Parks MRN: [id number] DOB: 08-Apr-1945 Today's Date: 01/14/2020    History of Present Illness The pt is a 74 yo female presenting s/p PLIF L2-3, L3-4, and L4-5 11/10 due to progressive bilateral back, hip, and leg pain. PMH includes: HLD, HTN, GERD, anxiety/depression, and gout, s/p Rt CTR    Clinical Impression   Pt admitted with above. She demonstrates the below listed deficits and will benefit from continued OT to maximize safety and independence with BADLs.  Pt requires set up to total A for ADLs and mod A for bed mobility.  She is limited by pain this date.  She reports she lives with spouse, who relies on RW for mobility, and she reports she was mod I for ADLs.  Anticipate good progress.  Will follow.       Follow Up Recommendations  Home health OT;Supervision - Intermittent    Equipment Recommendations  None recommended by OT    Recommendations for Other Services       Precautions / Restrictions Precautions Precautions: Back Precaution Booklet Issued: Yes (comment) Precaution Comments: Pt provided with back handout and precautions were reviewed with her  Required Braces or Orthoses: Spinal Brace      Mobility Bed Mobility Overal bed mobility: Needs Assistance Bed Mobility: Rolling;Sidelying to Sit;Sit to Sidelying Rolling: Min assist Sidelying to sit: Mod assist     Sit to sidelying: Mod assist General bed mobility comments: pt requires verbal cues for technique, and assist to roll to Lt.  Assist to guide LEs off bed and lift trunk as well as assist to guide trunk back to bed and assist to lift LEs onto bed     Transfers                 General transfer comment: Pt unable to tolerate due to increased pain upon sitting     Balance Overall balance assessment: Needs assistance Sitting-balance support: Feet supported;Bilateral upper extremity supported Sitting balance-Leahy Scale:  Poor Sitting balance - Comments: requires UE support and close min guard assist                                    ADL either performed or assessed with clinical judgement   ADL Overall ADL's : Needs assistance/impaired Eating/Feeding: Independent   Grooming: Wash/dry face;Wash/dry hands;Oral care;Brushing hair;Sitting;Set up   Upper Body Bathing: Minimal assistance;Sitting   Lower Body Bathing: Maximal assistance;Sit to/from stand   Upper Body Dressing : Moderate assistance;Sitting Upper Body Dressing Details (indicate cue type and reason): secondary to pain with sitting  Lower Body Dressing: Sit to/from stand;Maximal assistance   Toilet Transfer: Total assistance Toilet Transfer Details (indicate cue type and reason): unable due to pain  Toileting- Clothing Manipulation and Hygiene: Total assistance;Sit to/from stand       Functional mobility during ADLs: Moderate assistance (bed mobility ) General ADL Comments: Pt reports she performed figure 4 for LB ADLs PTA.  She is unable to fully cross ankles over knees this date      Vision Patient Visual Report: No change from baseline       Perception     Praxis      Pertinent Vitals/Pain Pain Assessment: 0-10 Pain Score: 6  Pain Location: back  Pain Descriptors / Indicators: Operative site guarding Pain Intervention(s): Monitored during session;Repositioned     Hand Dominance Right   Extremity/Trunk Assessment  Upper Extremity Assessment Upper Extremity Assessment: Overall WFL for tasks assessed   Lower Extremity Assessment Lower Extremity Assessment: Defer to PT evaluation   Cervical / Trunk Assessment Cervical / Trunk Assessment: Other exceptions Cervical / Trunk Exceptions: s/p lumbar fusion    Communication Communication Communication: No difficulties   Cognition Arousal/Alertness: Awake/alert Behavior During Therapy: WFL for tasks assessed/performed;Anxious Overall Cognitive Status: No  family/caregiver present to determine baseline cognitive functioning                                 General Comments: Pt is a bit slow to respond and requires min cues for problem solving - unsure of baseline, and if this may also be due to meds or anesthesia    General Comments  Spouse to bring in back brace     Exercises     Shoulder Instructions      Home Living Family/patient expects to be discharged to:: Private residence Living Arrangements: Spouse/significant other Available Help at Discharge: Family;Available PRN/intermittently Type of Home: House Home Access: Stairs to enter [employer] of Steps: 2   Home Layout: One level     Bathroom Shower/Tub: Occupational psychologist: Handicapped height Bathroom Accessibility: Yes How Accessible: Accessible via walker Home Equipment: [place] - 4 wheels;Bedside commode;Shower seat;Grab bars - tub/shower;Grab bars - toilet;Adaptive equipment Adaptive Equipment: [place]acher Additional Comments: Pt reports spouse utilizes a RW for mobility       Prior Functioning/Environment Level of Independence: Independent        Comments: Pt reports she was mod I with ADLs         OT Problem List: Decreased activity tolerance;Impaired balance (sitting and/or standing);Decreased cognition;Decreased safety awareness;Decreased knowledge of use of DME or AE;Decreased knowledge of precautions;Obesity;Pain      OT Treatment/Interventions: Self-care/ADL training;DME and/or AE instruction;Therapeutic activities;Patient/family education;Balance training    OT Goals(Current goals can be found in the care plan section) Acute Rehab OT Goals Patient Stated Goal: to be able to take care of self  OT Goal Formulation: With patient Time For Goal Achievement: 01/28/20 Potential to Achieve Goals: Good ADL Goals Pt Will Perform Grooming: (P) with set-up;with min guard assist Pt Will Perform Lower Body Bathing: (P) with min  guard assist;with adaptive equipment;sit to/from stand Pt Will Perform Upper Body Dressing: (P) with set-up;sitting Pt Will Perform Lower Body Dressing: (P) with min guard assist;with adaptive equipment;sit to/from stand Pt Will Transfer to Toilet: (P) with min guard assist;ambulating;regular height toilet;bedside commode;grab bars Pt Will Perform Toileting - Clothing Manipulation and hygiene: (P) with min guard assist;sit to/from stand  OT Frequency: Min 2X/week   Barriers to D/C: Decreased caregiver support  spouse with physical limitations        Co-evaluation              AM-PAC OT "6 Clicks" Daily Activity     Outcome Measure Help from another person eating meals?: None Help from another person taking care of personal grooming?: A Little Help from another person toileting, which includes using toliet, bedpan, or urinal?: Total Help from another person bathing (including washing, rinsing, drying)?: A Lot Help from another person to put on and taking off regular upper body clothing?: A Lot Help from another person to put on and taking off regular lower body clothing?: Total 6 Click Score: 13   End of Session Nurse Communication: Mobility status;Patient requests pain meds  Activity Tolerance: Patient  limited by pain Patient left: in bed;with call bell/phone within reach  OT Visit Diagnosis: Pain Pain - part of body:  (back )                Time: 4128-2081 OT Time Calculation (min): 22 min Charges:  OT General Charges $OT Visit: 1 Visit OT Evaluation $OT Eval Moderate Complexity: 1 Mod  Nilsa Nutting., OTR/L Acute Rehabilitation Services Pager [phone] Office [phone]   Lucille Passy M 01/14/2020, 9:46 AM

## 2020-01-14 NOTE — Progress Notes (Addendum)
Subjective: Patient reports some improvement in the NT in her legs. Has some back pain as expected.   Objective: Vital signs in last 24 hours: Temp:  [97 F (36.1 C)-97.9 F (36.6 C)] 97.8 F (36.6 C) (11/11 0805) Pulse Rate:  [60-76] 67 (11/11 0805) Resp:  [10-19] 14 (11/11 0805) BP: (69-117)/(39-67) 117/58 (11/11 0805) SpO2:  [91 %-100 %] 97 % (11/11 0805)  Intake/Output from previous day: 11/10 0701 - 11/11 0700 In: 4800 [I.V.:2750; Blood:1200; IV Piggyback:850] Out: 2730 [Urine:1305; Drains:525; Blood:900] Intake/Output this shift: No intake/output data recorded.  Neurologic: Grossly normal  Lab Results: Lab Results  Component Value Date   WBC 6.2 01/13/2020   HGB 9.1 (L) 01/14/2020   HCT 26.7 (L) 01/14/2020   MCV 93.2 01/13/2020   PLT 112 (L) 01/13/2020   No results found for: INR, PROTIME BMET Lab Results  Component Value Date   NA 134 (L) 01/13/2020   K 3.6 01/13/2020   CL 101 01/13/2020   CO2 24 01/13/2020   GLUCOSE 214 (H) 01/13/2020   BUN 30 (H) 01/13/2020   CREATININE 1.45 (H) 01/13/2020   CALCIUM 8.0 (L) 01/13/2020    Studies/Results: DG Lumbar Spine 2-3 Views  Result Date: 01/13/2020 CLINICAL DATA:  L2-L5 PLIF EXAM: LUMBAR SPINE - 2-3 VIEW; DG C-ARM 1-60 MIN COMPARISON:  12/03/2019 FLUOROSCOPY TIME:  Radiation Exposure Index (as provided by the fluoroscopic device): 91.6 mGy If the device does not provide the exposure index: Fluoroscopy Time:  1 minutes 32 seconds Number of Acquired Images:  3 FINDINGS: Initial images demonstrate surgical instrument at the L3-4 interspace. Numbering nomenclature is similar to that utilized on prior MRI. Subsequent interbody fusion at L2-3, L3-4 and L4-5 is noted with pedicle screw placement from L2-L5. IMPRESSION: Fusion from L2-L5. Electronically Signed   By: Inez Catalina M.D.   On: 01/13/2020 14:35   DG C-Arm 1-60 Min  Result Date: 01/13/2020 CLINICAL DATA:  L2-L5 PLIF EXAM: LUMBAR SPINE - 2-3 VIEW; DG C-ARM 1-60  MIN COMPARISON:  12/03/2019 FLUOROSCOPY TIME:  Radiation Exposure Index (as provided by the fluoroscopic device): 91.6 mGy If the device does not provide the exposure index: Fluoroscopy Time:  1 minutes 32 seconds Number of Acquired Images:  3 FINDINGS: Initial images demonstrate surgical instrument at the L3-4 interspace. Numbering nomenclature is similar to that utilized on prior MRI. Subsequent interbody fusion at L2-3, L3-4 and L4-5 is noted with pedicle screw placement from L2-L5. IMPRESSION: Fusion from L2-L5. Electronically Signed   By: Inez Catalina M.D.   On: 01/13/2020 14:35    Assessment/Plan: Postop day 1, 3 level PLIF. Doing ok this morning. Would like her out of bed and ambulating today. H&H improved this morning.    LOS: 1 day    Ocie Cornfield Vanellope Passmore 01/14/2020, 8:26 AM

## 2020-01-14 NOTE — Progress Notes (Signed)
Orthopedic Tech Progress Note Patient Details:  Patricia Parks September 16, 1945 [id number] Patient will have her back brace in the morning.  [name], female   DOB: 1945/12/04, 74 y.o.   MRN: [id number]   Jearld Lesch 01/14/2020, 12:46 AM

## 2020-01-14 NOTE — Progress Notes (Addendum)
Hanging patient second unit of blood. Patient tolerating second unit well and declines any reactions. Will continue to monitor patient.   0200- blood transfusion completed. Vitals taken and patient resting.

## 2020-01-14 NOTE — Progress Notes (Signed)
Subjective: Patient reports Patient notes improvement in sensation leg still has some numbness no radicular pain back pain manageable  Objective: Vital signs in last 24 hours: Temp:  [97 F (36.1 C)-97.9 F (36.6 C)] 97.8 F (36.6 C) (11/11 0805) Pulse Rate:  [60-76] 67 (11/11 0805) Resp:  [10-19] 14 (11/11 0805) BP: (69-117)/(39-67) 117/58 (11/11 0805) SpO2:  [91 %-100 %] 97 % (11/11 0805)  Intake/Output from previous day: 11/10 0701 - 11/11 0700 In: 4800 [I.V.:2750; Blood:1200; IV Piggyback:850] Out: 2730 [Urine:1305; Drains:525; Blood:900] Intake/Output this shift: No intake/output data recorded.  Awake alert strength 5/5  Lab Results: Recent Labs    01/13/20 0731 01/13/20 0731 01/13/20 1805 01/14/20 0530  WBC 7.8  --  6.2  --   HGB 11.6*   < > 7.8* 9.1*  HCT 33.7*   < > 23.4* 26.7*  PLT 145*  --  112*  --    < > = values in this interval not displayed.   BMET Recent Labs    01/13/20 0731 01/13/20 1805  NA 130* 134*  K 3.2* 3.6  CL 95* 101  CO2 24 24  GLUCOSE 155* 214*  BUN 27* 30*  CREATININE 1.59* 1.45*  CALCIUM 8.9 8.0*    Studies/Results: DG Lumbar Spine 2-3 Views  Result Date: 01/13/2020 CLINICAL DATA:  L2-L5 PLIF EXAM: LUMBAR SPINE - 2-3 VIEW; DG C-ARM 1-60 MIN COMPARISON:  12/03/2019 FLUOROSCOPY TIME:  Radiation Exposure Index (as provided by the fluoroscopic device): 91.6 mGy If the device does not provide the exposure index: Fluoroscopy Time:  1 minutes 32 seconds Number of Acquired Images:  3 FINDINGS: Initial images demonstrate surgical instrument at the L3-4 interspace. Numbering nomenclature is similar to that utilized on prior MRI. Subsequent interbody fusion at L2-3, L3-4 and L4-5 is noted with pedicle screw placement from L2-L5. IMPRESSION: Fusion from L2-L5. Electronically Signed   By: Inez Catalina M.D.   On: 01/13/2020 14:35   DG C-Arm 1-60 Min  Result Date: 01/13/2020 CLINICAL DATA:  L2-L5 PLIF EXAM: LUMBAR SPINE - 2-3 VIEW; DG C-ARM  1-60 MIN COMPARISON:  12/03/2019 FLUOROSCOPY TIME:  Radiation Exposure Index (as provided by the fluoroscopic device): 91.6 mGy If the device does not provide the exposure index: Fluoroscopy Time:  1 minutes 32 seconds Number of Acquired Images:  3 FINDINGS: Initial images demonstrate surgical instrument at the L3-4 interspace. Numbering nomenclature is similar to that utilized on prior MRI. Subsequent interbody fusion at L2-3, L3-4 and L4-5 is noted with pedicle screw placement from L2-L5. IMPRESSION: Fusion from L2-L5. Electronically Signed   By: Inez Catalina M.D.   On: 01/13/2020 14:35    Assessment/Plan: Postop day 1 L2-L5 PLIF continue to work with Physical occupational therapy today and her brace continue Hemovac for now  LOS: 1 day     Elaina Hoops 01/14/2020, 8:33 AM

## 2020-01-14 NOTE — Progress Notes (Addendum)
Patient arrived to unit via PACU. Vitals WNL and A&Ox4. Patient came to unit with Hemovac in place.Assessment completed. Patient also came to floor with blood transfusing and another transfusion to be hung once the first is completed. Patient tolerating it well and will continue to monitor.   This nurse was reported from the PACU that patient c/o decreased/ numbness in LLE that is improving. When doing assessment patient said that decreased sensation.numbness is no longer there.    Oriented patient to the room and unit. Call bed in reach and bed in lowest position. Call patient husband to notify that patient is now on 4NP and told him about visitation.Patients husband is aware to bring patients back brace in the morning when husband comes to visits

## 2020-01-14 NOTE — Evaluation (Signed)
Physical Therapy Evaluation Patient Details Name: Patricia Parks MRN: [id number] DOB: 31-Oct-1945 Today's Date: 01/14/2020   History of Present Illness  The pt is a 74 yo female presenting s/p PLIF L2-3, L3-4, and L4-5 11/10 due to progressive bilateral back, hip, and leg pain. PMH includes: HLD, HTN, GERD, anxiety/depression, and gout, s/p Rt CTR   Clinical Impression  Pt in bed upon arrival of PT, agreeable to evaluation at this time. Prior to admission the pt was independent without use of AD, able to complete ADLs independently, living in a home with 2 steps to enter. The pt now presents with limitations in functional mobility, strength, power, stability, and endurance due to above dx, and will continue to benefit from skilled PT to address these deficits. The pt was able to complete initial transfers and gait in her room with minA and use of RW. She does continue to need frequent cues for sequencing, technique, as well as minA to navigate around obstacles in the room. The pt will continue to benefit from skilled PT to progress independence with transfers, stability, and stair training prior to d/c home with family support.     Follow Up Recommendations Outpatient PT;Supervision for mobility/OOB    Equipment Recommendations  Rolling walker with 5" wheels    Recommendations for Other Services       Precautions / Restrictions Precautions Precautions: Back Precaution Booklet Issued: Yes (comment) Precaution Comments: pt provided with back handout, discussed application for functional tasks.  Required Braces or Orthoses: Spinal Brace Spinal Brace: Lumbar corset Restrictions Weight Bearing Restrictions: No      Mobility  Bed Mobility Overal bed mobility: Needs Assistance Bed Mobility: Rolling;Sidelying to Sit Rolling: Min assist Sidelying to sit: HOB elevated;Min assist       General bed mobility comments: pt requires verbal cues for technique, and assist to roll to Lt.   Assist to guide LEs off bed and lift trunk as well as assist to guide trunk back to bed and assist to lift LEs onto bed     Transfers Overall transfer level: Needs assistance Equipment used: Rolling walker (2 wheeled) Transfers: Sit to/from Stand Sit to Stand: Min assist         General transfer comment: minA to rise from EOB, good use of hands on bed to raise and on armrests to lower  Ambulation/Gait Ambulation/Gait assistance: Min guard Gait Distance (Feet): 25 Feet Assistive device: Rolling walker (2 wheeled) Gait Pattern/deviations: Step-through pattern;Decreased stride length Gait velocity: decreased Gait velocity interpretation: <1.31 ft/sec, indicative of household ambulator General Gait Details: slow gait with variations in step length, staggering steps posteriorly when standing statically, minA multiple times in room to avoid obstacles     Balance Overall balance assessment: Needs assistance Sitting-balance support: Feet supported;Bilateral upper extremity supported Sitting balance-Leahy Scale: Poor Sitting balance - Comments: requires UE support and close min guard assist    Standing balance support: Bilateral upper extremity supported Standing balance-Leahy Scale: Poor Standing balance comment: reliant on BUE support                             Pertinent Vitals/Pain Pain Assessment: Faces Faces Pain Scale: Hurts a little bit Pain Location: back  Pain Descriptors / Indicators: Operative site guarding Pain Intervention(s): Monitored during session;Repositioned;Premedicated before session    Home Living Family/patient expects to be discharged to:: Private residence Living Arrangements: Spouse/significant other Available Help at Discharge: Family;Available PRN/intermittently Type of Home: House  Home Access: Stairs to enter Entrance Stairs-Rails: Right Entrance Stairs-Number of Steps: 2 Home Layout: One level Home Equipment: Walker - 4  wheels;Bedside commode;Shower seat;Grab bars - tub/shower;Grab bars - toilet;Adaptive equipment Additional Comments: Pt reports spouse utilizes a RW for mobility due to his need for hip replacement    Prior Function Level of Independence: Independent         Comments: Pt reports she was mod I with ADLs, not using AD for gait, limited by pain     Hand Dominance   Dominant Hand: Right    Extremity/Trunk Assessment   Upper Extremity Assessment Upper Extremity Assessment: Overall WFL for tasks assessed    Lower Extremity Assessment Lower Extremity Assessment: Overall WFL for tasks assessed (pt reports no difference in sensation bilaterally)    Cervical / Trunk Assessment Cervical / Trunk Assessment: Other exceptions Cervical / Trunk Exceptions: s/p lumbar fusion   Communication   Communication: No difficulties  Cognition Arousal/Alertness: Awake/alert Behavior During Therapy: WFL for tasks assessed/performed;Anxious Overall Cognitive Status: No family/caregiver present to determine baseline cognitive functioning                                 General Comments: Pt initially a bit slow to respond to questions, requies frequent cues for sequencing and positioning, reminders of application of spinal precautions with movement      General Comments General comments (skin integrity, edema, etc.): VSS on RA    Exercises General Exercises - Lower Extremity Long Arc Quad: AROM;Both;5 reps;Seated Heel Raises: AROM;Both;15 reps;Seated   Assessment/Plan    PT Assessment Patient needs continued PT services  PT Problem List Decreased strength;Decreased range of motion;Decreased activity tolerance;Decreased balance;Decreased mobility;Decreased coordination;Decreased safety awareness;Pain;Decreased knowledge of precautions       PT Treatment Interventions DME instruction;Gait training;Stair training;Functional mobility training;Therapeutic activities;Therapeutic  exercise;Balance training;Patient/family education    PT Goals (Current goals can be found in the Care Plan section)  Acute Rehab PT Goals Patient Stated Goal: to be able to take care of self  PT Goal Formulation: With patient Time For Goal Achievement: 01/28/20 Potential to Achieve Goals: Fair    Frequency Min 5X/week    AM-PAC PT "6 Clicks" Mobility  Outcome Measure Help needed turning from your back to your side while in a flat bed without using bedrails?: A Little Help needed moving from lying on your back to sitting on the side of a flat bed without using bedrails?: A Little Help needed moving to and from a bed to a chair (including a wheelchair)?: A Little Help needed standing up from a chair using your arms (e.g., wheelchair or bedside chair)?: A Little Help needed to walk in hospital room?: A Little Help needed climbing 3-5 steps with a railing? : A Lot 6 Click Score: 17    End of Session Equipment Utilized During Treatment: Gait belt;Back brace Activity Tolerance: Patient tolerated treatment well;Patient limited by fatigue Patient left: in chair;with call bell/phone within reach Nurse Communication: Mobility status PT Visit Diagnosis: Unsteadiness on feet (R26.81);Other abnormalities of gait and mobility (R26.89)    Time: 8786-7672 PT Time Calculation (min) (ACUTE ONLY): 44 min   Charges:   PT Evaluation $PT Eval Moderate Complexity: 1 Mod PT Treatments $Gait Training: 23-37 mins        Karma Ganja, PT, DPT   Acute Rehabilitation Department Pager #: [phone]  Otho Bellows 01/14/2020, 5:35 PM

## 2020-01-15 ENCOUNTER — Encounter (HOSPITAL_COMMUNITY): Payer: Self-pay | Admitting: Neurosurgery

## 2020-01-15 LAB — CBC
HCT: 26.5 % — ABNORMAL LOW (ref 36.0–46.0)
Hemoglobin: 8.9 g/dL — ABNORMAL LOW (ref 12.0–15.0)
MCH: 30.5 pg (ref 26.0–34.0)
MCHC: 33.6 g/dL (ref 30.0–36.0)
MCV: 90.8 fL (ref 80.0–100.0)
Platelets: 123 10*3/uL — ABNORMAL LOW (ref 150–400)
RBC: 2.92 MIL/uL — ABNORMAL LOW (ref 3.87–5.11)
RDW: 15.2 % (ref 11.5–15.5)
WBC: 5.7 10*3/uL (ref 4.0–10.5)
nRBC: 0 % (ref 0.0–0.2)

## 2020-01-15 MED ORDER — SODIUM CHLORIDE 0.9 % IV BOLUS
1000.0000 mL | Freq: Once | INTRAVENOUS | Status: AC
  Administered 2020-01-15: 1000 mL via INTRAVENOUS

## 2020-01-15 MED ORDER — PANTOPRAZOLE SODIUM 40 MG PO TBEC
40.0000 mg | DELAYED_RELEASE_TABLET | Freq: Two times a day (BID) | ORAL | Status: DC
  Administered 2020-01-15 – 2020-01-21 (×12): 40 mg via ORAL
  Filled 2020-01-15 (×12): qty 1

## 2020-01-15 MED ORDER — DEXAMETHASONE SODIUM PHOSPHATE 10 MG/ML IJ SOLN
10.0000 mg | Freq: Four times a day (QID) | INTRAMUSCULAR | Status: DC
  Administered 2020-01-15 – 2020-01-16 (×3): 10 mg via INTRAVENOUS
  Filled 2020-01-15 (×4): qty 1

## 2020-01-15 MED FILL — Heparin Sodium (Porcine) Inj 1000 Unit/ML: INTRAMUSCULAR | Qty: 30 | Status: AC

## 2020-01-15 MED FILL — Sodium Chloride IV Soln 0.9%: INTRAVENOUS | Qty: 1000 | Status: AC

## 2020-01-15 NOTE — Progress Notes (Signed)
Subjective: Patient reports Condition of back pain radiculopathy still improved patient currently on the bedside commode  Objective: Vital signs in last 24 hours: Temp:  [96.7 F (35.9 C)-98.2 F (36.8 C)] 98 F (36.7 C) (11/12 1115) Pulse Rate:  [61-71] 64 (11/12 1115) Resp:  [13-20] 13 (11/12 1115) BP: (91-120)/(53-68) 108/62 (11/12 1115) SpO2:  [91 %-100 %] 99 % (11/12 1115)  Intake/Output from previous day: 11/11 0701 - 11/12 0700 In: 978.3 [P.O.:600; I.V.:78.3; IV Piggyback:300] Out: 1925 [Urine:1600; Drains:325] Intake/Output this shift: No intake/output data recorded.  Defer exam to later as patient was on bedside commode  Lab Results: Recent Labs    01/13/20 0731 01/13/20 0731 01/13/20 1805 01/14/20 0530  WBC 7.8  --  6.2  --   HGB 11.6*   < > 7.8* 9.1*  HCT 33.7*   < > 23.4* 26.7*  PLT 145*  --  112*  --    < > = values in this interval not displayed.   BMET Recent Labs    01/13/20 0731 01/13/20 1805  NA 130* 134*  K 3.2* 3.6  CL 95* 101  CO2 24 24  GLUCOSE 155* 214*  BUN 27* 30*  CREATININE 1.59* 1.45*  CALCIUM 8.9 8.0*    Studies/Results: DG Lumbar Spine 2-3 Views  Result Date: 01/13/2020 CLINICAL DATA:  L2-L5 PLIF EXAM: LUMBAR SPINE - 2-3 VIEW; DG C-ARM 1-60 MIN COMPARISON:  12/03/2019 FLUOROSCOPY TIME:  Radiation Exposure Index (as provided by the fluoroscopic device): 91.6 mGy If the device does not provide the exposure index: Fluoroscopy Time:  1 minutes 32 seconds Number of Acquired Images:  3 FINDINGS: Initial images demonstrate surgical instrument at the L3-4 interspace. Numbering nomenclature is similar to that utilized on prior MRI. Subsequent interbody fusion at L2-3, L3-4 and L4-5 is noted with pedicle screw placement from L2-L5. IMPRESSION: Fusion from L2-L5. Electronically Signed   By: Inez Catalina M.D.   On: 01/13/2020 14:35   DG C-Arm 1-60 Min  Result Date: 01/13/2020 CLINICAL DATA:  L2-L5 PLIF EXAM: LUMBAR SPINE - 2-3 VIEW; DG  C-ARM 1-60 MIN COMPARISON:  12/03/2019 FLUOROSCOPY TIME:  Radiation Exposure Index (as provided by the fluoroscopic device): 91.6 mGy If the device does not provide the exposure index: Fluoroscopy Time:  1 minutes 32 seconds Number of Acquired Images:  3 FINDINGS: Initial images demonstrate surgical instrument at the L3-4 interspace. Numbering nomenclature is similar to that utilized on prior MRI. Subsequent interbody fusion at L2-3, L3-4 and L4-5 is noted with pedicle screw placement from L2-L5. IMPRESSION: Fusion from L2-L5. Electronically Signed   By: Inez Catalina M.D.   On: 01/13/2020 14:35    Assessment/Plan: Postop day 2 L2-L5 interbody fusion seems to be making normal and expected recovery continue to work with physical Occupational Therapy patient may be DC'd when pain managed and if cleared for home health versus   LOS: 2 days     Elaina Hoops 01/15/2020, 11:28 AM

## 2020-01-15 NOTE — Care Management Important Message (Signed)
Important Message  Patient Details  Name: Patricia Parks MRN: [id number] Date of Birth: 1945/08/12   Medicare Important Message Given:  Yes     Orbie Pyo 01/15/2020, 3:50 PM

## 2020-01-15 NOTE — Progress Notes (Signed)
PT Cancellation Note  Patient Details Name: Patricia Parks MRN: [id number] DOB: Jul 05, 1945   Cancelled Treatment:    Reason Eval/Treat Not Completed: Pain limiting ability to participate. PT attempted x 2 this afternoon, pt reports significant pain and declines activity for today each time despite attempts to coordinate around pain medicine delivery. Will continue to follow and treat as able.   Hardie Pulley, DPT   Acute Rehabilitation Department Pager #: [phone]   Otho Bellows 01/15/2020, 4:33 PM

## 2020-01-15 NOTE — Progress Notes (Signed)
OT Cancellation Note  Patient Details Name: Patricia Parks MRN: [id number] DOB: December 15, 1945   Cancelled Treatment:    Reason Eval/Treat Not Completed: Other (comment).  Attempted to see pt. For skilled OT treatment.  Pt. Found with blankets covering her face.  States she does not want the blankets removed. States she needs to create a place of "privacy".  Does not want to see people or have people see her at this time. Asked if there was any specific reason for the need for privacy and was there anything I could do to make her feel more comfortable.  She states that she just has to stay the way she is and can not remove the blankets from her face at this time.  Did request additional blankets and wanted her L/R UE placed under the blankets because she was cold.  Reviewed her lunch tray was in the room. She states she does not have the strength and will need to be fed when she has reached a place where she is ready to remove the blankets and eat.  Reviewed use of call bell to assist her. Placed under blankets where she could feel and reach the call bell.  Reports no other needs at this time.  Will check back for therapy as pt. Able.     Daiva Huge Lorraine-COTA/L 01/15/2020, 12:48 PM

## 2020-01-16 LAB — CBC
HCT: 28.9 % — ABNORMAL LOW (ref 36.0–46.0)
Hemoglobin: 9.7 g/dL — ABNORMAL LOW (ref 12.0–15.0)
MCH: 29.8 pg (ref 26.0–34.0)
MCHC: 33.6 g/dL (ref 30.0–36.0)
MCV: 88.7 fL (ref 80.0–100.0)
Platelets: 165 10*3/uL (ref 150–400)
RBC: 3.26 MIL/uL — ABNORMAL LOW (ref 3.87–5.11)
RDW: 14.7 % (ref 11.5–15.5)
WBC: 5.9 10*3/uL (ref 4.0–10.5)
nRBC: 0 % (ref 0.0–0.2)

## 2020-01-16 MED ORDER — DEXAMETHASONE SODIUM PHOSPHATE 4 MG/ML IJ SOLN
4.0000 mg | Freq: Two times a day (BID) | INTRAMUSCULAR | Status: DC
  Administered 2020-01-16 – 2020-01-17 (×3): 4 mg via INTRAVENOUS
  Filled 2020-01-16 (×3): qty 1

## 2020-01-16 MED ORDER — ENOXAPARIN SODIUM 40 MG/0.4ML ~~LOC~~ SOLN
40.0000 mg | SUBCUTANEOUS | Status: DC
  Administered 2020-01-16 – 2020-01-17 (×2): 40 mg via SUBCUTANEOUS
  Filled 2020-01-16 (×2): qty 0.4

## 2020-01-16 NOTE — Progress Notes (Addendum)
Subjective:  Patient reports no leg pain, numbness or weakness.  She still has a considerable amount of back soreness.  Objective: Vital signs in last 24 hours: Temp:  [97.5 F (36.4 C)-98.1 F (36.7 C)] 97.9 F (36.6 C) (11/13 0850) Pulse Rate:  [62-75] 70 (11/13 0850) Resp:  [16] 16 (11/13 0850) BP: (100-146)/(55-84) 135/72 (11/13 0850) SpO2:  [95 %-98 %] 98 % (11/13 0850)  Intake/Output from previous day: 11/12 0701 - 11/13 0700 In: 300 [P.O.:300] Out: 3500 [Urine:3200; Drains:300] Intake/Output this shift: No intake/output data recorded.  NAD 5/5 strength in LEs Resting comfortably Dressing clean dry and intact  Lab Results: Recent Labs    01/15/20 1202 01/16/20 0641  WBC 5.7 5.9  HGB 8.9* 9.7*  HCT 26.5* 28.9*  PLT 123* 165   BMET Recent Labs    01/13/20 1805  NA 134*  K 3.6  CL 101  CO2 24  GLUCOSE 214*  BUN 30*  CREATININE 1.45*  CALCIUM 8.0*    Studies/Results: No results found.  Assessment/Plan: S/p 3 lvl PLIF -I discussed with the patient the importance of participation and mobilization with PT - possible drain DC tomorrow (output 300 ml currently) -Continue pain control -Pharmacologic DVT prophylaxis -Patient is on 10 mg IV Decadron every 6 hours.  I have decreased this to 4 mg every Russell 01/16/2020, 11:29 AM

## 2020-01-16 NOTE — Progress Notes (Signed)
Occupational Therapy Treatment Patient Details Name: Patricia Parks MRN: [id number] DOB: 1945-06-21 Today's Date: 01/16/2020    History of present illness The pt is a 74 yo female presenting s/p PLIF L2-3, L3-4, and L4-5 11/10 due to progressive bilateral back, hip, and leg pain. PMH includes: HLD, HTN, GERD, anxiety/depression, and gout, s/p Rt CTR    OT comments  Pt with c/o of increased pain, and inability to progress to OOB.  With max cues and encouragement, she was able to log roll Lt and Rt with min A and heavy use of bedrails.  She was unable to progress to OOB at this time.  Will reattempt once pain improved.  If she is unable to progress, she may need to consider SNF level rehab as she has limited assist at home as spouse is disabled.   Follow Up Recommendations  SNF;Supervision/Assistance - 24 hour (if unable to progress )    Equipment Recommendations  None recommended by OT    Recommendations for Other Services      Precautions / Restrictions Precautions Precautions: Back Precaution Booklet Issued: Yes (comment) Precaution Comments: reviewed back precautions with pt  Required Braces or Orthoses: Spinal Brace Spinal Brace: Lumbar corset       Mobility Bed Mobility Overal bed mobility: Needs Assistance Bed Mobility: Rolling Rolling: Min assist         General bed mobility comments: with max encouragement, pt rolled Lt and Rt x 2 with mod cues for log rolling technique and assist to fully roll to each side.  Heavy use of bed rails.  She was only able to tolerate rolling due to severity of back pain   Transfers                      Balance                                           ADL either performed or assessed with clinical judgement   ADL                                         General ADL Comments: Pt unable due to increased pain      Vision       Perception     Praxis      Cognition  Arousal/Alertness: Awake/alert Behavior During Therapy: WFL for tasks assessed/performed;Anxious Overall Cognitive Status: No family/caregiver present to determine baseline cognitive functioning                                          Exercises     Shoulder Instructions       General Comments      Pertinent Vitals/ Pain       Pain Assessment: 0-10 Pain Score: 8  Pain Location: back  Pain Descriptors / Indicators: Aching;Operative site guarding Pain Intervention(s): Monitored during session;Patient requesting pain meds-RN notified  Home Living                                          Prior Functioning/Environment  Frequency  Min 2X/week        Progress Toward Goals  OT Goals(current goals can now be found in the care plan section)  Progress towards OT goals: Not progressing toward goals - comment (due to pain )     Plan Discharge plan needs to be updated    Co-evaluation                 AM-PAC OT "6 Clicks" Daily Activity     Outcome Measure   Help from another person eating meals?: None Help from another person taking care of personal grooming?: A Little Help from another person toileting, which includes using toliet, bedpan, or urinal?: Total Help from another person bathing (including washing, rinsing, drying)?: Total Help from another person to put on and taking off regular upper body clothing?: Total Help from another person to put on and taking off regular lower body clothing?: Total 6 Click Score: 11    End of Session    OT Visit Diagnosis: Pain   Activity Tolerance Patient limited by pain   Patient Left in bed;with call bell/phone within reach;with bed alarm set   Nurse Communication Patient requests pain meds        Time: 0905-0920 OT Time Calculation (min): 15 min  Charges: OT General Charges $OT Visit: 1 Visit OT Treatments $Therapeutic Activity: 8-22 mins  Nilsa Nutting.,  OTR/L Acute Rehabilitation Services Pager [phone] Office [phone]    Lucille Passy M 01/16/2020, 9:33 AM

## 2020-01-16 NOTE — Progress Notes (Signed)
Physical Therapy Treatment Patient Details Name: Patricia Parks MRN: [id number] DOB: March 03, 1946 Today's Date: 01/16/2020    History of Present Illness The pt is a 74 yo female presenting s/p PLIF L2-3, L3-4, and L4-5 11/10 due to progressive bilateral back, hip, and leg pain. PMH includes: HLD, HTN, GERD, anxiety/depression, and gout, s/p Rt CTR     PT Comments    The pt was able to make progress in mobility goals at this time, due to improved pain control at time of PT arrival. The pt was able to complete transfers OOB and 2 bouts of short ambulation in the room with use of RW and chair follow for safety. However, the pt continues to need minA at times to power up for transfers, as well as minA intermittently while walking due to staggering steps and posterior LOB. D/c upgraded to short term SNF at this time as minimal change in pt's stability with initial gait training sessions, and the pt's husband also uses a RW and will not safely be able to assist her/maintain balance. Will continue to assess in future sessions and may return recommendation to OPPT pending pt progression.     Follow Up Recommendations  SNF (vs OPPT pending progression)     Equipment Recommendations  Rolling walker with 5" wheels    Recommendations for Other Services       Precautions / Restrictions Precautions Precautions: Back Precaution Booklet Issued: Yes (comment) Precaution Comments: reviewed back precautions with pt, poor recollection, required constant reminders and cues Required Braces or Orthoses: Spinal Brace Spinal Brace: Lumbar corset Restrictions Weight Bearing Restrictions: No    Mobility  Bed Mobility Overal bed mobility: Needs Assistance Bed Mobility: Rolling;Sidelying to Sit Rolling: Min assist Sidelying to sit: HOB elevated;Min assist       General bed mobility comments: with significant cues for technique and maintaining spinal precautions with movement, pt able to complete  rolling and sidelying to sit with minA. No carryover of technique noted from prior session  Transfers Overall transfer level: Needs assistance Equipment used: Rolling walker (2 wheeled) Transfers: Sit to/from Stand Sit to Stand: Min guard         General transfer comment: minA initially to rise from EOB, but then minG for x4 transfers within session. cues for hand positioning  Ambulation/Gait Ambulation/Gait assistance: Min guard Gait Distance (Feet): 25 Feet (x2 ) Assistive device: Rolling walker (2 wheeled) Gait Pattern/deviations: Step-through pattern;Decreased stride length;Trunk flexed Gait velocity: decreased Gait velocity interpretation: <1.31 ft/sec, indicative of household ambulator General Gait Details: slow gait with variations in step length, staggering steps posteriorly when standing statically, minA multiple times in room to avoid obstacles      Balance Overall balance assessment: Needs assistance Sitting-balance support: Feet supported;Bilateral upper extremity supported Sitting balance-Leahy Scale: Poor Sitting balance - Comments: requires UE support and close min guard assist    Standing balance support: Bilateral upper extremity supported Standing balance-Leahy Scale: Poor Standing balance comment: reliant on BUE support                            Cognition Arousal/Alertness: Awake/alert Behavior During Therapy: WFL for tasks assessed/performed;Anxious Overall Cognitive Status: No family/caregiver present to determine baseline cognitive functioning                                 General Comments: Pt pleasant through session, reporting improved pain  and willing to participate in PT session. Frequent reminders for precautions and movement techniques discussed and practiced in prior sessions due to poor carry over      Exercises      General Comments General comments (skin integrity, edema, etc.): drain from incision intact       Pertinent Vitals/Pain Pain Assessment: 0-10 Pain Score: 5  Pain Location: back  Pain Descriptors / Indicators: Aching;Operative site guarding Pain Intervention(s): Limited activity within patient's tolerance;Monitored during session;Repositioned    Home Living                      Prior Function            PT Goals (current goals can now be found in the care plan section) Acute Rehab PT Goals Patient Stated Goal: to be able to take care of self  PT Goal Formulation: With patient Time For Goal Achievement: 01/28/20 Potential to Achieve Goals: Fair Progress towards PT goals: Progressing toward goals    Frequency    Min 5X/week      PT Plan Discharge plan needs to be updated    Co-evaluation              AM-PAC PT "6 Clicks" Mobility   Outcome Measure  Help needed turning from your back to your side while in a flat bed without using bedrails?: A Little Help needed moving from lying on your back to sitting on the side of a flat bed without using bedrails?: A Little Help needed moving to and from a bed to a chair (including a wheelchair)?: A Little Help needed standing up from a chair using your arms (e.g., wheelchair or bedside chair)?: A Little Help needed to walk in hospital room?: A Little Help needed climbing 3-5 steps with a railing? : A Lot 6 Click Score: 17    End of Session Equipment Utilized During Treatment: Gait belt;Back brace Activity Tolerance: Patient tolerated treatment well;Patient limited by fatigue Patient left: in chair;with call bell/phone within reach Nurse Communication: Mobility status PT Visit Diagnosis: Unsteadiness on feet (R26.81);Other abnormalities of gait and mobility (R26.89)     Time: 0156-1537 PT Time Calculation (min) (ACUTE ONLY): 26 min  Charges:  $Gait Training: 23-37 mins                     Karma Ganja, PT, DPT   Acute Rehabilitation Department Pager #: [phone]   Otho Bellows 01/16/2020,  1:04 PM

## 2020-01-16 NOTE — Progress Notes (Signed)
Patient calling via call bell for pain medication multiple times despite oxycodone being administered at 2052 (see eMAR). Pt then calls niece, niece calls this nurse and states her aunt's pain is severe and not being managed appropriately. Niece has multiple questions regarding pain management. Pt assessed and found in bed sleeping comfortably with respirations even and unlabored. No justification for prn narcotics present, Tylenol offered and pt agrees to take but questions when she can have "good pain pills" again. Pt educated on narcotic use side effects and indications. Pt becomes upset that this RN will not administer narcotics and states "you're just a bully." No s/sx of distress evident at this time. Will continue to monitor and assess pain.

## 2020-01-17 MED ORDER — KETOROLAC TROMETHAMINE 30 MG/ML IJ SOLN
15.0000 mg | Freq: Four times a day (QID) | INTRAMUSCULAR | Status: AC
  Administered 2020-01-17 – 2020-01-18 (×5): 15 mg via INTRAVENOUS
  Filled 2020-01-17 (×5): qty 1

## 2020-01-17 MED ORDER — DIAZEPAM 5 MG PO TABS
5.0000 mg | ORAL_TABLET | Freq: Four times a day (QID) | ORAL | Status: DC | PRN
  Administered 2020-01-17 – 2020-01-20 (×7): 5 mg via ORAL
  Filled 2020-01-17 (×7): qty 1

## 2020-01-17 MED ORDER — OXYCODONE HCL ER 15 MG PO T12A
15.0000 mg | EXTENDED_RELEASE_TABLET | Freq: Two times a day (BID) | ORAL | Status: DC
  Administered 2020-01-17 – 2020-01-21 (×8): 15 mg via ORAL
  Filled 2020-01-17 (×9): qty 1

## 2020-01-17 NOTE — Progress Notes (Signed)
2145- patient is found in bed, emotional and sobbing. Pt states "I called all my family members and told them to stop calling me. I need rest." While this RN is in room pt receives another call from her "best friend" per pt and refuses to take call, stating to tell the caller to contact her in the morning. 2350- patient tells this RN "my best friend is very persistent and kept calling my cell phone. She said she's going to visit me tomorrow and I don't want her here. She's a bully and will not take no for an answer." Will notify charge nurse and day shift RN that pt does not want visitors.

## 2020-01-17 NOTE — Progress Notes (Signed)
[name], female   DOB: May 01, 1945, 74 y.o.   MRN: [id number] BP (!) 144/69 (BP Location: Left Arm)   Pulse 84   Temp 98 F (36.7 C)   Resp 20   Ht 5\' 5"  (1.651 m)   Wt 108.9 kg   LMP 03/05/2000   SpO2 100%   BMI 39.94 kg/m  Alert and oriented x 4, speech is clear and fluent Moving lower extremities well Wound is clean, and dry, no signs of infection Drain removed Pain medication altered, valium given for spasms Has not been comfortable since operation will see if these changes help

## 2020-01-17 NOTE — Progress Notes (Signed)
Occupational Therapy Treatment Patient Details Name: Patricia Parks MRN: [id number] DOB: 08-Aug-1945 Today's Date: 01/17/2020    History of present illness The pt is a 74 yo female presenting s/p PLIF L2-3, L3-4, and L4-5 11/10 due to progressive bilateral back, hip, and leg pain. PMH includes: HLD, HTN, GERD, anxiety/depression, and gout, s/p Rt CTR    OT comments  Pt much brighter and interactive today.  She is able to perform figure 4 today and is now able to perform LB ADLs with min guard assist.  She is performing min guard assist with functional mobility, but does fatigue.  Discussed my concerns re: discharging home with spouse who has physical limitations and that she may need SNF level rehab.  She now says that her daughter, from [place], will be staying with her at discharge as long as needed.  Pt requesting resources for counseling services.   Follow Up Recommendations  SNF;Supervision/Assistance - 24 hour (but may be able to progress to [facility] )    Equipment Recommendations       Recommendations for Other Services      Precautions / Restrictions Precautions Precautions: Back Precaution Booklet Issued: Yes (comment) Precaution Comments: pt able to state 3/3 back precautions  Required Braces or Orthoses: Spinal Brace Spinal Brace: Lumbar corset       Mobility Bed Mobility Overal bed mobility: Needs Assistance Bed Mobility: Rolling;Sidelying to Sit Rolling: Min guard            Transfers Overall transfer level: Needs assistance Equipment used: Rolling walker (2 wheeled) Transfers: Sit to/from [facility] Sit to Stand: Min guard Stand pivot transfers: Min guard       General transfer comment: min guard for safety     Balance Overall balance assessment: Needs assistance Sitting-balance support: Feet supported;Bilateral upper extremity supported Sitting balance-Leahy Scale: Fair Sitting balance - Comments: able to maintain static sitting with  supervision    Standing balance support: Single extremity supported Standing balance-Leahy Scale: Poor Standing balance comment: reliant on UE support                            ADL either performed or assessed with clinical judgement   ADL Overall ADL's : Needs assistance/impaired Eating/Feeding: Independent           Lower Body Bathing: Min guard;Sit to/from stand       Lower Body Dressing: Min guard;Sit to/from stand   Toilet Transfer: Min guard;Ambulation;Comfort height toilet;Grab bars;RW   Toileting- Clothing Manipulation and Hygiene: Minimal assistance;Sit to/from stand       Functional mobility during ADLs: Min guard;Rolling walker General ADL Comments: Pt able to perform figure 4 today      Vision       Perception     Praxis      Cognition Arousal/Alertness: Awake/alert Behavior During Therapy: WFL for tasks assessed/performed Overall Cognitive Status: Within Functional Limits for tasks assessed                                 General Comments: Pt much brighter and interactive this date         Exercises     Shoulder Instructions       General Comments Pt able to don brace with min A in sitting.  Discussed concerns for discharge with her, and that she may need SNF level rehab.  She  now reports that daughter, from [place], will be staying with her and can assist as needed.  Pt requested resources for counseling     Pertinent Vitals/ Pain       Pain Assessment: 0-10 Pain Score: 5  Pain Location: back  Pain Descriptors / Indicators: Aching;Operative site guarding Pain Intervention(s): Monitored during session;RN gave pain meds during session;Patient requesting pain meds-RN notified  Home Living                                          Prior Functioning/Environment              Frequency  Min 2X/week        Progress Toward Goals  OT Goals(current goals can now be found in the care plan  section)  Progress towards OT goals: Progressing toward goals     Plan Discharge plan remains appropriate    Co-evaluation                 AM-PAC OT "6 Clicks" Daily Activity     Outcome Measure   Help from another person eating meals?: None Help from another person taking care of personal grooming?: A Little Help from another person toileting, which includes using toliet, bedpan, or urinal?: A Little Help from another person bathing (including washing, rinsing, drying)?: A Little Help from another person to put on and taking off regular upper body clothing?: A Little Help from another person to put on and taking off regular lower body clothing?: A Little 6 Click Score: 19    End of Session Equipment Utilized During Treatment: Gait belt;Rolling walker;Back brace  OT Visit Diagnosis: Unsteadiness on feet (R26.81);Pain Pain - part of body:  (back )   Activity Tolerance Patient tolerated treatment well   Patient Left in chair;with call bell/phone within reach;with chair alarm set   Nurse Communication Mobility status;Patient requests pain meds        Time: 1448-1856 OT Time Calculation (min): 29 min  Charges: OT General Charges $OT Visit: 1 Visit OT Treatments $Self Care/Home Management : 8-22 mins $Therapeutic Activity: 8-22 mins  Patricia Parks., OTR/L Acute Rehabilitation Services Pager [phone] Office [phone]    Patricia Parks M 01/17/2020, 4:03 PM

## 2020-01-18 NOTE — NC FL2 (Signed)
[place] LEVEL OF CARE SCREENING TOOL     IDENTIFICATION  Patient Name: Patricia Parks Birthdate: 08-Oct-1945 Sex: female Admission Date (Current Location): 01/13/2020  [facility] [place] Number:  Herbalist and Address:  [facility] [place], [place] [address] [place], [place] [address]      Provider Number: [id number]  Attending Physician Name and Address:  Kary Kos, MD  Relative Name and Phone Number:       Current Level of Care: Hospital Recommended Level of Care: [place] Prior Approval Number:    Date Approved/Denied:   PASRR Number: [id number]  Discharge Plan: SNF    Current Diagnoses: Patient Active Problem List   Diagnosis Date Noted  . Spondylolisthesis at L4-L5 level 01/13/2020  . Pain in left knee 05/28/2017  . Elevated lipids 12/30/2014  . History of cervical dysplasia 12/30/2014  . Essential hypertension 09/10/2013  . Chest pain 08/28/2013    Orientation RESPIRATION BLADDER Height & Weight     Self, Time, Situation, Place  Normal Continent Weight: 240 lb (108.9 kg) Height:  5\' 5"  (165.1 cm)  BEHAVIORAL SYMPTOMS/MOOD NEUROLOGICAL BOWEL NUTRITION STATUS      Continent Diet (please see discharge summary)  AMBULATORY STATUS COMMUNICATION OF NEEDS Skin     Verbally Surgical wounds (closed incision, Back)                       Personal Care Assistance Level of Assistance  Bathing, Feeding, Dressing Bathing Assistance: Limited assistance Feeding assistance: Independent Dressing Assistance: Limited assistance     Functional Limitations Info  Sight, Hearing, Speech Sight Info: Adequate Hearing Info: Adequate Speech Info: Adequate    SPECIAL CARE FACTORS FREQUENCY  PT (By licensed PT), OT (By licensed OT)     PT Frequency: 5x per week OT Frequency: 5x per week            Contractures Contractures Info: Not present    Additional Factors Info  Code Status, Allergies Code  Status Info: Full Allergies Info: NKA           Current Medications (01/18/2020):  This is the current hospital active medication list Current Facility-Administered Medications  Medication Dose Route Frequency Provider Last Rate Last Admin  . 0.9 %  sodium chloride infusion (Manually program via Guardrails IV Fluids)   Intravenous Once Ashok Pall, MD      . 0.9 %  sodium chloride infusion  250 mL Intravenous Continuous Kary Kos, MD 1 mL/hr at 01/13/20 2317 250 mL at 01/13/20 2317  . acetaminophen (TYLENOL) tablet 650 mg  650 mg Oral Q4H PRN Kary Kos, MD   650 mg at 01/16/20 2144   Or  . acetaminophen (TYLENOL) suppository 650 mg  650 mg Rectal Q4H PRN Kary Kos, MD      . allopurinol (ZYLOPRIM) tablet 100 mg  100 mg Oral Daily Kary Kos, MD   100 mg at 01/18/20 0941  . alum & mag hydroxide-simeth (MAALOX/MYLANTA) 200-200-20 MG/5ML suspension 30 mL  30 mL Oral Q6H PRN Kary Kos, MD      . amLODipine (NORVASC) tablet 2.5 mg  2.5 mg Oral Daily Kary Kos, MD   2.5 mg at 01/18/20 0941  . busPIRone (BUSPAR) tablet 7.5 mg  7.5 mg Oral BID Kary Kos, MD   7.5 mg at 01/18/20 0942  . chlorthalidone (HYGROTON) tablet 12.5 mg  12.5 mg Oral q morning - 10a Kary Kos, MD  12.5 mg at 01/18/20 0942  . diazepam (VALIUM) tablet 5 mg  5 mg Oral Q6H PRN Ashok Pall, MD   5 mg at 01/18/20 0751  . levothyroxine (SYNTHROID) tablet 75 mcg  75 mcg Oral QAC breakfast Kary Kos, MD   75 mcg at 01/18/20 0517  . losartan (COZAAR) tablet 100 mg  100 mg Oral Daily Kary Kos, MD   100 mg at 01/18/20 0941  . menthol-cetylpyridinium (CEPACOL) lozenge 3 mg  1 lozenge Oral PRN Kary Kos, MD       Or  . phenol (CHLORASEPTIC) mouth spray 1 spray  1 spray Mouth/Throat PRN Kary Kos, MD      . metoprolol succinate (TOPROL-XL) 24 hr tablet 50 mg  50 mg Oral Daily Kary Kos, MD   50 mg at 01/18/20 0940  . ondansetron (ZOFRAN) tablet 4 mg  4 mg Oral Q6H PRN Kary Kos, MD       Or  . ondansetron [facility])  injection 4 mg  4 mg Intravenous Q6H PRN Kary Kos, MD      . oxyCODONE (Oxy IR/ROXICODONE) immediate release tablet 10 mg  10 mg Oral Q3H PRN Kary Kos, MD   10 mg at 01/18/20 0751  . oxyCODONE (OXYCONTIN) 12 hr tablet 15 mg  15 mg Oral Q12H Ashok Pall, MD   15 mg at 01/18/20 0941  . pantoprazole (PROTONIX) EC tablet 40 mg  40 mg Oral BID Kary Kos, MD   40 mg at 01/18/20 0941  . pregabalin (LYRICA) capsule 300 mg  300 mg Oral BID Kary Kos, MD   300 mg at 01/18/20 0941  . rosuvastatin (CRESTOR) tablet 10 mg  10 mg Oral q1800 Kary Kos, MD   10 mg at 01/17/20 1813  . sodium chloride flush (NS) 0.9 % injection 3 mL  3 mL Intravenous Q12H Kary Kos, MD   3 mL at 01/18/20 0945  . sodium chloride flush (NS) 0.9 % injection 3 mL  3 mL Intravenous PRN Kary Kos, MD      . triamterene-hydrochlorothiazide (MGNOIBB-04) 37.5-25 MG per tablet 1 tablet  1 tablet Oral Daily Kary Kos, MD   1 tablet at 01/18/20 8889     Discharge Medications: Please see discharge summary for a list of discharge medications.  Relevant Imaging Results:  Relevant Lab Results:   Additional Information SSN [id number]  Vinie Sill, LCSWA

## 2020-01-18 NOTE — TOC Initial Note (Signed)
Transition of Care [facility]) - Initial/Assessment Note    Patient Details  Name: Patricia Parks MRN: [id number] Date of Birth: 03/24/1945  Transition of Care [facility]) CM/SW Contact:    Vinie Sill, [place] Phone Number: 01/18/2020, 11:33 AM  Clinical Narrative:                  CSW met with patient at bedside. CSW introduced self and explained role. CSW discussed with patient short term rehab at SNF if recommended by PT. Patient states she is agreeable to SNF but her preference is to discharge home. She expressed concerns about getting into the home-if needed, CSW advised can transport by PTAR. She states her daughter,[name] will travel from [place] to stay with her as needed. CSW spoke with her daughter Warren [phone]) she confirmed and states she would need to know as soon as possible if recommended to discharge home.    Patient was agreeable to start SNF process while waiting on updated recommendations by PT. CSW explained the SNF process to patient and her daughter. Patient has received covid vaccine and booster shot. No further questions or concerns noted at this time.  CSW will continue to follow for updated PT notes-  Thurmond Butts, MSW, [place] Social Worker    Barriers to Discharge: Continued Medical Work up   Patient Goals and CMS Choice        Expected Discharge Plan and Services   In-house Referral: Clinical Social Work                                            Prior Living Arrangements/Services   Lives with:: Self, Spouse Patient language and need for interpreter reviewed:: No        Need for Family Participation in Patient Care: Yes (Comment) Care giver support system in place?: Yes (comment)   Criminal Activity/Legal Involvement Pertinent to Current Situation/Hospitalization: No - Comment as needed  Activities of Daily Living Home Assistive Devices/Equipment: None ADL Screening (condition at time of admission) Patient's  cognitive ability adequate to safely complete daily activities?: Yes Is the patient deaf or have difficulty hearing?: No Does the patient have difficulty seeing, even when wearing glasses/contacts?: No Does the patient have difficulty concentrating, remembering, or making decisions?: No Patient able to express need for assistance with ADLs?: Yes Does the patient have difficulty dressing or bathing?: Yes Independently performs ADLs?: No Communication: Independent Dressing (OT): Needs assistance Is this a change from baseline?: Pre-admission baseline Grooming: Independent Feeding: Independent Bathing: Independent Toileting: Independent In/Out Bed: Independent Walks in Home: Independent Does the patient have difficulty walking or climbing stairs?: Yes Weakness of Legs: Both Weakness of Arms/Hands: None  Permission Sought/Granted Permission sought to share information with : Family Supports Permission granted to share information with : Yes, Verbal Permission Granted  Share Information with NAME: Patricia Parks  Permission granted to share info w AGENCY: SNFs  Permission granted to share info w Relationship: spouse  Permission granted to share info w Contact Information: [phone]  Emotional Assessment Appearance:: Appears stated age Attitude/Demeanor/Rapport: Engaged Affect (typically observed): Accepting, Appropriate, Pleasant Orientation: : Oriented to Self, Oriented to Place, Oriented to  Time, Oriented to Situation Alcohol / Substance Use: Not Applicable Psych Involvement: No (comment)  Admission diagnosis:  Spondylolisthesis at L4-L5 level [M43.16] Patient Active Problem List   Diagnosis Date Noted  . Spondylolisthesis at L4-L5  level 01/13/2020  . Pain in left knee 05/28/2017  . Elevated lipids 12/30/2014  . History of cervical dysplasia 12/30/2014  . Essential hypertension 09/10/2013  . Chest pain 08/28/2013   PCP:  Secundino Ginger, PA-C Pharmacy:   [id number] [place] [place] [address]: [phone]: [address] [address]Phone: [phone]: [phone]    Social Determinants of Health (SDOH) Interventions    Readmission Risk Interventions No flowsheet data found.

## 2020-01-18 NOTE — Progress Notes (Signed)
Physical Therapy Treatment Patient Details Name: Patricia Parks MRN: [id number] DOB: October 30, 1945 Today's Date: 01/18/2020    History of Present Illness The pt is a 74 yo female presenting s/p PLIF L2-3, L3-4, and L4-5 11/10 due to progressive bilateral back, hip, and leg pain. PMH includes: HLD, HTN, GERD, anxiety/depression, and gout, s/p Rt CTR     PT Comments    The pt is continuing to progress with mobility and PT goals this session. She was able to improve ambulation distance with mostly minG for safety. However, the pt's balance remains a concern as she had multiple posterior LOB during static stance and gait requiring min/modA to recover despite use of RW. The pt states she was able to tell she was falling but did not know what to do to correct each time without cues. The pt remains highly motivated to return to her prior level of independence and therefore I feel a short stint of SNF rehab is best for safest possible eventual d/c home.    Follow Up Recommendations  SNF;Supervision/Assistance - 24 hour     Equipment Recommendations  Rolling walker with 5" wheels    Recommendations for Other Services       Precautions / Restrictions Precautions Precautions: Back Precaution Booklet Issued: Yes (comment) Precaution Comments: pt able to state 3/3 back precautions, but unable to apply to movement without cues Required Braces or Orthoses: Spinal Brace Spinal Brace: Lumbar corset;Applied in sitting position Restrictions Weight Bearing Restrictions: No    Mobility  Bed Mobility Overal bed mobility: Needs Assistance Bed Mobility: Rolling;Sidelying to Sit Rolling: Min guard Sidelying to sit: HOB elevated;Min guard       General bed mobility comments: minG with minor cues for log roll, significant improvement from prior session  Transfers Overall transfer level: Needs assistance Equipment used: Rolling walker (2 wheeled) Transfers: Sit to/from Stand Sit to Stand: Min  assist         General transfer comment: minG to rise, but minA immediately to steady due to multiple posterior LOB. Pt states she is aware she is falling, unsure how to correct without cues  Ambulation/Gait Ambulation/Gait assistance: Min guard;Min assist Gait Distance (Feet): 120 Feet Assistive device: Rolling walker (2 wheeled) Gait Pattern/deviations: Step-through pattern;Decreased stride length;Staggering left;Staggering right;Leaning posteriorly Gait velocity: 0.25 m/s Gait velocity interpretation: <1.8 ft/sec, indicate of risk for recurrent falls General Gait Details: slow gait with variation in step lenght, multiple LOB posteriorly during gait requiring minA to maintain upright. Pt states her husband could not get to her fast enough to assist.       Balance Overall balance assessment: Needs assistance Sitting-balance support: Feet supported;Bilateral upper extremity supported Sitting balance-Leahy Scale: Fair Sitting balance - Comments: able to maintain static sitting with supervision    Standing balance support: Bilateral upper extremity supported Standing balance-Leahy Scale: Poor Standing balance comment: reliant on UE support                             Cognition Arousal/Alertness: Awake/alert Behavior During Therapy: WFL for tasks assessed/performed Overall Cognitive Status: No family/caregiver present to determine baseline cognitive functioning Area of Impairment: Memory;Safety/judgement;Problem solving                     Memory: Decreased recall of precautions;Decreased short-term memory   Safety/Judgement: Decreased awareness of safety;Decreased awareness of deficits   Problem Solving: Decreased initiation;Difficulty sequencing;Requires verbal cues General Comments: Pt pleasant and engaged,  but requires frequent cues for safety, frequent reminders for spinal precautions, and cues for safety with all transfers and mobility. For example, pt  had multiple posterior LOB during today's session, states this happens to her at home, could not state any ideas or techniques to improve safety at home, and stated her husband couldn't help her and likely wouldn't find her for a while             Pertinent Vitals/Pain Pain Assessment: Faces Faces Pain Scale: Hurts little more Pain Location: back  Pain Descriptors / Indicators: Aching;Operative site guarding Pain Intervention(s): Limited activity within patient's tolerance;Monitored during session           PT Goals (current goals can now be found in the care plan section) Acute Rehab PT Goals Patient Stated Goal: to be able to take care of self  PT Goal Formulation: With patient Time For Goal Achievement: 01/28/20 Potential to Achieve Goals: Fair Progress towards PT goals: Progressing toward goals    Frequency    Min 5X/week      PT Plan Current plan remains appropriate       AM-PAC PT "6 Clicks" Mobility   Outcome Measure  Help needed turning from your back to your side while in a flat bed without using bedrails?: A Little Help needed moving from lying on your back to sitting on the side of a flat bed without using bedrails?: A Little Help needed moving to and from a bed to a chair (including a wheelchair)?: A Little Help needed standing up from a chair using your arms (e.g., wheelchair or bedside chair)?: A Little Help needed to walk in hospital room?: A Little Help needed climbing 3-5 steps with a railing? : A Lot 6 Click Score: 17    End of Session Equipment Utilized During Treatment: Gait belt;Back brace Activity Tolerance: Patient tolerated treatment well Patient left: in chair;with call bell/phone within reach Nurse Communication: Mobility status PT Visit Diagnosis: Unsteadiness on feet (R26.81);Other abnormalities of gait and mobility (R26.89)     Time: 7564-3329 PT Time Calculation (min) (ACUTE ONLY): 27 min  Charges:  $Gait Training: 23-37  mins                     Karma Ganja, PT, DPT   Acute Rehabilitation Department Pager #: [phone]   Otho Bellows 01/18/2020, 4:00 PM

## 2020-01-18 NOTE — Progress Notes (Signed)
Subjective: Patient reports Patient doing much better today still condition of back pain significant provement preoperative lower extremity numbness tingling and strength.  Objective: Vital signs in last 24 hours: Temp:  [97.5 F (36.4 C)-98.3 F (36.8 C)] 98.1 F (36.7 C) (11/15 0731) Pulse Rate:  [55-66] 64 (11/15 0731) Resp:  [12-20] 15 (11/15 0731) BP: (121-155)/(58-81) 155/81 (11/15 0731) SpO2:  [95 %-100 %] 98 % (11/15 0731)  Intake/Output from previous day: 11/14 0701 - 11/15 0700 In: 600 [P.O.:600] Out: 1500 [Urine:1500] Intake/Output this shift: No intake/output data recorded.  Strength 5 out of 5 wound clean dry and intact  Lab Results: Recent Labs    01/15/20 1202 01/16/20 0641  WBC 5.7 5.9  HGB 8.9* 9.7*  HCT 26.5* 28.9*  PLT 123* 165   BMET No results for input(s): NA, K, CL, CO2, GLUCOSE, BUN, CREATININE, CALCIUM in the last 72 hours.  Studies/Results: No results found.  Assessment/Plan: Postop day 5 continue to progressively mobilize with physical Occupational Therapy work on discharge planning will await therapy recommendations today for rehab versus home health  LOS: 5 days     Patricia Parks 01/18/2020, 9:18 AM

## 2020-01-19 MED ORDER — POLYETHYLENE GLYCOL 3350 17 G PO PACK
17.0000 g | PACK | Freq: Every day | ORAL | Status: DC | PRN
  Administered 2020-01-19: 17 g via ORAL
  Filled 2020-01-19: qty 1

## 2020-01-19 MED ORDER — DOCUSATE SODIUM 100 MG PO CAPS
100.0000 mg | ORAL_CAPSULE | Freq: Two times a day (BID) | ORAL | Status: DC
  Administered 2020-01-19 – 2020-01-21 (×5): 100 mg via ORAL
  Filled 2020-01-19 (×5): qty 1

## 2020-01-19 NOTE — Progress Notes (Signed)
Subjective: Patient reports Patient doing better improved back pain no lower extremity pain numbness or tingling.  Objective: Vital signs in last 24 hours: Temp:  [97.3 F (36.3 C)-98.3 F (36.8 C)] 98.3 F (36.8 C) (11/16 0725) Pulse Rate:  [57-67] 62 (11/16 0725) Resp:  [11-16] 16 (11/16 0725) BP: (104-135)/(65-77) 118/74 (11/16 0725) SpO2:  [97 %-100 %] 98 % (11/16 0725)  Intake/Output from previous day: 11/15 0701 - 11/16 0700 In: 700 [P.O.:700] Out: 600 [Urine:600] Intake/Output this shift: No intake/output data recorded.  Awake alert strength 5-5 wound clean dry and intact  Lab Results: No results for input(s): WBC, HGB, HCT, PLT in the last 72 hours. BMET No results for input(s): NA, K, CL, CO2, GLUCOSE, BUN, CREATININE, CALCIUM in the last 72 hours.  Studies/Results: No results found.  Assessment/Plan: Postop day 5 lumbar fusion doing very well significant provement preoperative back and leg pain patient is stable for transfer and discharge to nursing facility when bed becomes available.  LOS: 6 days     Elaina Hoops 01/19/2020, 7:50 AM

## 2020-01-19 NOTE — Progress Notes (Signed)
Occupational Therapy Treatment Patient Details Name: Patricia Parks MRN: [id number] DOB: 08-30-1945 Today's Date: 01/19/2020    History of present illness The pt is a 74 yo female presenting s/p PLIF L2-3, L3-4, and L4-5 11/10 due to progressive bilateral back, hip, and leg pain. PMH includes: HLD, HTN, GERD, anxiety/depression, and gout, s/p Rt CTR    OT comments  Pt making gradual progress towards OT goals. Today performing room level mobility using RW, toileting and standing grooming ADL overall with minA. Pt does require up to mod cues for safety as she has reduced awareness (currently demonstrating emergent awareness) requiring cues/assist to bring notice to gradual LOB with prolonged standing activity. Requiring min cues for carryover of back precautions throughout functional tasks. VSS throughout. Pt and spouse both appear to be in agreement with SNF level therapies at time of discharge. Given current functional level as well as cognitive status feel SNF recommendation remains most appropriate at this time. Will continue to follow while acutely admitted.    Follow Up Recommendations  SNF;Supervision/Assistance - 24 hour    Equipment Recommendations  None recommended by OT          Precautions / Restrictions Precautions Precautions: Back Precaution Booklet Issued: Yes (comment) Precaution Comments: cues to recall 3/3 precautions  Required Braces or Orthoses: Spinal Brace Spinal Brace: Lumbar corset;Applied in sitting position Restrictions Weight Bearing Restrictions: No       Mobility Bed Mobility Overal bed mobility: Needs Assistance Bed Mobility: Rolling;Sidelying to Sit;Sit to Sidelying Rolling: Min guard Sidelying to sit: Min guard     Sit to sidelying: Min assist General bed mobility comments: light assist for LEs onto EOB when returning to sidelying; increased time/effort throughout but with good recall of log roll technique   Transfers Overall transfer  level: Needs assistance Equipment used: Rolling walker (2 wheeled) Transfers: Sit to/from Stand Sit to Stand: Min guard         General transfer comment: assist for safety and balance; pt with good demonstration of UE placement     Balance Overall balance assessment: Needs assistance Sitting-balance support: Feet supported;Bilateral upper extremity supported Sitting balance-Leahy Scale: Fair Sitting balance - Comments: able to maintain static sitting with supervision    Standing balance support: Bilateral upper extremity supported;Single extremity supported;During functional activity Standing balance-Leahy Scale: Poor Standing balance comment: reliant on external assist, pt trialling performing ADL tasks without UE support however pt's bil LEs gradually start to flex at knees - requires cues/assist to boost back to full standing                            ADL either performed or assessed with clinical judgement   ADL Overall ADL's : Needs assistance/impaired     Grooming: Wash/dry hands;Brushing hair;Oral care;Minimal assistance;Standing;Cueing for compensatory techniques Grooming Details (indicate cue type and reason): assist for balance and upright - pt's bil LEs slowly start to flex at knees with prolonged static standing, requires assist/cues to push back to upright standing          Upper Body Dressing : Min guard;Sitting Upper Body Dressing Details (indicate cue type and reason): donning/doffing back brace, min cues for full completion     Toilet Transfer: Minimal assistance;Ambulation;Regular Toilet;Grab bars;RW   Toileting- Clothing Manipulation and Hygiene: Minimal assistance;Sit to/from stand Toileting - Clothing Manipulation Details (indicate cue type and reason): pt performing anterior pericare with minA for balance      Functional mobility  during ADLs: Minimal assistance;Rolling walker                         Cognition Arousal/Alertness:  Awake/alert Behavior During Therapy: WFL for tasks assessed/performed Overall Cognitive Status: Impaired/Different from baseline Area of Impairment: Memory;Safety/judgement;Problem solving;Awareness                     Memory: Decreased recall of precautions;Decreased short-term memory   Safety/Judgement: Decreased awareness of safety;Decreased awareness of deficits Awareness: Emergent Problem Solving: Decreased initiation;Difficulty sequencing;Requires verbal cues General Comments: pt intermittently requires cues for safety, unaware of gradually starting to lose her balance while standing at sink and required verbal cue to draw attention to it         Exercises     Shoulder Instructions       General Comments VSS on RA    Pertinent Vitals/ Pain       Pain Assessment: Faces Faces Pain Scale: Hurts little more Pain Location: back ; incisional Pain Descriptors / Indicators: Aching;Operative site guarding Pain Intervention(s): Monitored during session;Limited activity within patient's tolerance;Patient requesting pain meds-RN notified;RN gave pain meds during session  Home Living                                          Prior Functioning/Environment              Frequency  Min 2X/week        Progress Toward Goals  OT Goals(current goals can now be found in the care plan section)  Progress towards OT goals: Progressing toward goals  Acute Rehab OT Goals Patient Stated Goal: to be able to take care of self  OT Goal Formulation: With patient Time For Goal Achievement: 01/28/20 Potential to Achieve Goals: Good ADL Goals Pt Will Perform Grooming: with set-up;with min guard assist Pt Will Perform Lower Body Bathing: with min guard assist;with adaptive equipment;sit to/from stand Pt Will Perform Upper Body Dressing: with set-up;sitting Pt Will Perform Lower Body Dressing: with min guard assist;with adaptive equipment;sit to/from stand Pt  Will Transfer to Toilet: with min guard assist;ambulating;regular height toilet;bedside commode;grab bars Pt Will Perform Toileting - Clothing Manipulation and hygiene: with min guard assist;sit to/from stand  Plan Discharge plan remains appropriate    Co-evaluation                 AM-PAC OT "6 Clicks" Daily Activity     Outcome Measure   Help from another person eating meals?: None Help from another person taking care of personal grooming?: A Little Help from another person toileting, which includes using toliet, bedpan, or urinal?: A Little Help from another person bathing (including washing, rinsing, drying)?: A Little Help from another person to put on and taking off regular upper body clothing?: A Little Help from another person to put on and taking off regular lower body clothing?: A Little 6 Click Score: 19    End of Session Equipment Utilized During Treatment: Gait belt;Rolling walker;Back brace  OT Visit Diagnosis: Unsteadiness on feet (R26.81);Pain Pain - part of body:  (back)   Activity Tolerance Patient tolerated treatment well   Patient Left in bed;with call bell/phone within reach;with bed alarm set;with family/visitor present;with nursing/sitter in room   Nurse Communication Mobility status        Time: 4696-2952 OT Time Calculation (min): 29 min  Charges: OT General Charges $OT Visit: 1 Visit OT Treatments $Self Care/Home Management : 23-37 mins  Lou Cal, OT Acute Rehabilitation Services Pager [phone] Office [phone]    Raymondo Band 01/19/2020, 3:23 PM

## 2020-01-19 NOTE — TOC Progression Note (Signed)
Transition of Care [facility]) - Progression Note    Patient Details  Name: Patricia Parks MRN: [id number] Date of Birth: 1945-12-24  Transition of Care [facility]) CM/SW [place], [place] Phone Number: 01/19/2020, 10:04 AM  Clinical Narrative:     CSW spoke with patient's spouse,Jack- CSW provided bed offers- he will review and inform CSW of their choice.  Thurmond Butts, MSW, LCSWA Clinical Social Worker     Barriers to Discharge: Continued Medical Work up  [employer] and Services   In-house Referral: Clinical Social Work                                             Social Determinants of Health (SDOH) Interventions    Readmission Risk Interventions No flowsheet data found.

## 2020-01-20 LAB — SARS CORONAVIRUS 2 BY RT PCR (HOSPITAL ORDER, PERFORMED IN ~~LOC~~ HOSPITAL LAB): SARS Coronavirus 2: NEGATIVE

## 2020-01-20 NOTE — TOC Progression Note (Signed)
Transition of Care [facility]) - Progression Note    Patient Details  Name: Patricia Parks MRN: [id number] Date of Birth: 16-Apr-1945  Transition of Care [facility]) CM/SW [place], [place] Phone Number: 01/20/2020, 1:54 PM  Clinical Narrative:     Patient's insurance remains pending.  Thurmond Butts, MSW, LCSWA Clinical Social Worker     Barriers to Discharge: Continued Medical Work up  [employer] and Services   In-house Referral: Clinical Social Work                                             Social Determinants of Health (SDOH) Interventions    Readmission Risk Interventions No flowsheet data found.

## 2020-01-20 NOTE — Progress Notes (Signed)
Physical Therapy Treatment Patient Details Name: Patricia Parks MRN: [id number] DOB: 1945-04-12 Today's Date: 01/20/2020    History of Present Illness The pt is a 74 yo female presenting s/p PLIF L2-3, L3-4, and L4-5 11/10 due to progressive bilateral back, hip, and leg pain. PMH includes: HLD, HTN, GERD, anxiety/depression, and gout, s/p Rt CTR     PT Comments    Patient with increased confusion this session. Disoriented to place and time, patient stated "I am at [employer] Oriented patient with patient asking if I was trying to confuse her. Patient overall required min guard for safety for all mobility. Patient ambulated 60' with RW and min guard, states there was a cat in her room and began talking to her husband who was not present. Patient able to recall 1/3 precautions without cueing, with cueing able to recall 2/3 precautions. Re-educated patient on spinal precautions. At end of session, patient able to recall 2/3 precautions, however able to perform bed mobility while maintaining precautions. Patient continues to present with generalized weakness, impaired balance, decreased activity tolerance. Continue to recommend SNF for ongoing Physical Therapy.       Follow Up Recommendations  SNF;Supervision/Assistance - 24 hour     Equipment Recommendations  Rolling Naelle Diegel with 5" wheels    Recommendations for Other Services       Precautions / Restrictions Precautions Precautions: Back Precaution Booklet Issued: Yes (comment) Precaution Comments: cues to recall 2/3 precautions, unable to recall bending. Re-educated patient on 3/3 precautions Required Braces or Orthoses: Spinal Brace Spinal Brace: Lumbar corset;Applied in sitting position Restrictions Weight Bearing Restrictions: No    Mobility  Bed Mobility Overal bed mobility: Needs Assistance Bed Mobility: Rolling;Sidelying to Sit;Sit to Sidelying Rolling: Min guard Sidelying to sit: Min guard     Sit to sidelying:  Min assist General bed mobility comments: light assist for LEs onto EOB when returning to sidelying; increased time/effort throughout but with good recall of log roll technique   Transfers Overall transfer level: Needs assistance Equipment used: Rolling Eilee Schader (2 wheeled) Transfers: Sit to/from Stand Sit to Stand: Min guard Stand pivot transfers: Min guard       General transfer comment: min guard for safety and balance. Stand pivot bed<>BSC  Ambulation/Gait Ambulation/Gait assistance: Min guard Gait Distance (Feet): 30 Feet Assistive device: Rolling Kammy Klett (2 wheeled) Gait Pattern/deviations: Step-through pattern;Decreased stride length;Staggering left;Staggering right;Leaning posteriorly     General Gait Details: Pt states there was a cat in her room, required min guard for safety and balance    Stairs             Wheelchair Mobility    Modified Rankin (Stroke Patients Only)       Balance Overall balance assessment: Needs assistance Sitting-balance support: Feet supported;Bilateral upper extremity supported Sitting balance-Leahy Scale: Fair Sitting balance - Comments: able to maintain static sitting with supervision    Standing balance support: Bilateral upper extremity supported;During functional activity Standing balance-Leahy Scale: Poor Standing balance comment: B knees begin to flex as she ambulates with head turns                            Cognition Arousal/Alertness: Awake/alert Behavior During Therapy: Flat affect Overall Cognitive Status: Impaired/Different from baseline Area of Impairment: Orientation;Memory;Safety/judgement;Awareness;Problem solving                 Orientation Level: Disoriented to;Place;Time   Memory: Decreased recall of precautions;Decreased short-term memory  Safety/Judgement: Decreased awareness of safety;Decreased awareness of deficits Awareness: Emergent Problem Solving: Decreased  initiation;Difficulty sequencing;Requires verbal cues        Exercises      General Comments General comments (skin integrity, edema, etc.): unable to obtain accurate spO2 reading due to cold hands       Pertinent Vitals/Pain Pain Assessment: Faces Faces Pain Scale: Hurts little more Pain Location: back ; incisional Pain Descriptors / Indicators: Aching;Operative site guarding Pain Intervention(s): Limited activity within patient's tolerance;Monitored during session    Home Living                      Prior Function            PT Goals (current goals can now be found in the care plan section) Acute Rehab PT Goals Patient Stated Goal: did not state PT Goal Formulation: With patient Time For Goal Achievement: 01/28/20 Potential to Achieve Goals: Fair Progress towards PT goals: Progressing toward goals    Frequency    Min 5X/week      PT Plan Current plan remains appropriate    Co-evaluation              AM-PAC PT "6 Clicks" Mobility   Outcome Measure  Help needed turning from your back to your side while in a flat bed without using bedrails?: A Little Help needed moving from lying on your back to sitting on the side of a flat bed without using bedrails?: A Little Help needed moving to and from a bed to a chair (including a wheelchair)?: A Little Help needed standing up from a chair using your arms (e.g., wheelchair or bedside chair)?: A Little Help needed to walk in hospital room?: A Little Help needed climbing 3-5 steps with a railing? : A Lot 6 Click Score: 17    End of Session Equipment Utilized During Treatment: Gait belt;Back brace Activity Tolerance: Other (comment) (Limited by confusion) Patient left: in bed;with call bell/phone within reach;with bed alarm set Nurse Communication: Mobility status PT Visit Diagnosis: Unsteadiness on feet (R26.81);Other abnormalities of gait and mobility (R26.89)     Time: 0086-7619 PT Time  Calculation (min) (ACUTE ONLY): 30 min  Charges:  $Therapeutic Activity: 23-37 mins                     Perrin Maltese, PT, DPT Acute Rehabilitation Services Pager [phone] Office [phone]    Patricia Parks 01/20/2020, 3:17 PM

## 2020-01-20 NOTE — TOC Progression Note (Signed)
Transition of Care [facility]) - Progression Note    Patient Details  Name: Ave Scharnhorst MRN: [id number] Date of Birth: 1945/06/22  Transition of Care [facility]) CM/SW [place], [place] Phone Number: 01/20/2020, 10:14 AM  Clinical Narrative:     Patient's [facility] chose [facility] - [facility] has confirmed bed offer-  CSW started insurance authorization-insurance pending   Thurmond Butts, MSW, LCSWA Clinical Social Worker     Barriers to Discharge: Continued Medical Work up  [employer] and Services   In-house Referral: Clinical Social Work                                             Social Determinants of Health (SDOH) Interventions    Readmission Risk Interventions No flowsheet data found.

## 2020-01-21 MED ORDER — OXYCODONE HCL 10 MG PO TABS: 10.0000 mg | ORAL_TABLET | Freq: Four times a day (QID) | ORAL | 0 refills | Status: DC | PRN

## 2020-01-21 MED ORDER — METHOCARBAMOL 500 MG PO TABS: 500.0000 mg | ORAL_TABLET | Freq: Four times a day (QID) | ORAL | 0 refills | Status: DC

## 2020-01-21 NOTE — Progress Notes (Signed)
Physical Therapy Treatment Patient Details Name: Patricia Parks MRN: [id number] DOB: Feb 27, 1946 Today's Date: 01/21/2020    History of Present Illness The pt is a 74 yo female presenting s/p PLIF L2-3, L3-4, and L4-5 11/10 due to progressive bilateral back, hip, and leg pain. PMH includes: HLD, HTN, GERD, anxiety/depression, and gout, s/p Rt CTR     PT Comments    The pt was in bed awaiting transport at arrival of PT, agreeable to session with focus on spinal precautions and safety with mobility. The pt was able to complete transfers and short ambulation to the bathroom, but required minA for all mobility due to significant instability with little pt effort to correct until cued directly. The pt was able to improve posture when cued, but immediately would drift or start to flex knees and sink with static stance without support. The pt was giddy and laughing through session with minimal awareness asking the PT questions such as "am I allowed to use this soap?" and "are these pants mine" (referring to the pants she was wearing). The pt required significantly increased assist and verbal cues for all mobility today to maintain safety during session and will continue to benefit from skilled PT to progress functional safety with mobility.     Follow Up Recommendations  SNF;Supervision/Assistance - 24 hour     Equipment Recommendations  Rolling walker with 5" wheels    Recommendations for Other Services       Precautions / Restrictions Precautions Precautions: Back Precaution Booklet Issued: Yes (comment) Precaution Comments: pt able to recall 3/3 precautions, but wholly unfamiliar with brace despite cues for donning and use in all previous sessions Required Braces or Orthoses: Spinal Brace Spinal Brace: Lumbar corset;Applied in sitting position Restrictions Weight Bearing Restrictions: No    Mobility  Bed Mobility Overal bed mobility: Needs Assistance Bed Mobility:  Rolling;Sidelying to Sit Rolling: Min guard Sidelying to sit: Min guard       General bed mobility comments: pt able to come to sitting without assist, use of bed rails and elevated HOB  Transfers Overall transfer level: Needs assistance Equipment used: Rolling walker (2 wheeled) Transfers: Sit to/from Stand Sit to Stand: Min assist         General transfer comment: minA to power up and to steady in stance  Ambulation/Gait Ambulation/Gait assistance: Min assist Gait Distance (Feet): 20 Feet Assistive device: Rolling walker (2 wheeled) Gait Pattern/deviations: Step-through pattern;Decreased stride length;Staggering left;Staggering right;Leaning posteriorly   Gait velocity interpretation: <1.31 ft/sec, indicative of household ambulator General Gait Details: pt frequently pushing RW too fast for safety despite cues, generally drifting and losing balance until cued at all other times.       Balance Overall balance assessment: Needs assistance Sitting-balance support: Feet supported;Bilateral upper extremity supported Sitting balance-Leahy Scale: Fair Sitting balance - Comments: able to maintain static sitting with supervision    Standing balance support: Bilateral upper extremity supported;During functional activity Standing balance-Leahy Scale: Poor Standing balance comment: B knees begin to flex as she ambulates with head turns                            Cognition Arousal/Alertness: Awake/alert Behavior During Therapy: Flat affect Overall Cognitive Status: Impaired/Different from baseline Area of Impairment: Orientation;Memory;Safety/judgement;Awareness;Problem solving                 Orientation Level: Disoriented to;Situation   Memory: Decreased recall of precautions;Decreased short-term memory   Safety/Judgement:  Decreased awareness of safety;Decreased awareness of deficits Awareness: Emergent Problem Solving: Decreased initiation;Difficulty  sequencing;Requires verbal cues General Comments: pt requiring frequent cues for safety, to maintain upright. unaware of gradually losing balance with standing and walking until cued to correct      Exercises      General Comments General comments (skin integrity, edema, etc.): VSS, pt gradually drifiting down with static stance or falling posteriorly      Pertinent Vitals/Pain Pain Assessment: Faces Faces Pain Scale: Hurts a little bit Pain Location: back ; incisional Pain Descriptors / Indicators: Aching;Operative site guarding Pain Intervention(s): Monitored during session;Repositioned           PT Goals (current goals can now be found in the care plan section) Acute Rehab PT Goals Patient Stated Goal: did not state PT Goal Formulation: With patient Time For Goal Achievement: 01/28/20 Potential to Achieve Goals: Fair Progress towards PT goals: Progressing toward goals    Frequency    Min 5X/week      PT Plan Current plan remains appropriate       AM-PAC PT "6 Clicks" Mobility   Outcome Measure  Help needed turning from your back to your side while in a flat bed without using bedrails?: A Little Help needed moving from lying on your back to sitting on the side of a flat bed without using bedrails?: A Little Help needed moving to and from a bed to a chair (including a wheelchair)?: A Little Help needed standing up from a chair using your arms (e.g., wheelchair or bedside chair)?: A Little Help needed to walk in hospital room?: A Little Help needed climbing 3-5 steps with a railing? : A Lot 6 Click Score: 17    End of Session Equipment Utilized During Treatment: Gait belt;Back brace Activity Tolerance: Patient tolerated treatment well Patient left: in chair;with call bell/phone within reach Nurse Communication: Mobility status PT Visit Diagnosis: Unsteadiness on feet (R26.81);Other abnormalities of gait and mobility (R26.89)     Time: 1115-5208 PT Time  Calculation (min) (ACUTE ONLY): 17 min  Charges:  $Gait Training: 8-22 mins                     Karma Ganja, PT, DPT   Acute Rehabilitation Department Pager #: [phone]   Otho Bellows 01/21/2020, 5:16 PM

## 2020-01-21 NOTE — Discharge Summary (Signed)
Physician Discharge Summary  [name] MRN: [id number] DOB/AGE: [birth year] 74 y.o.  Admit date: 01/13/2020 Discharge date: 01/21/2020  Admission Diagnoses: Severe lumbar spinal stenosis L2-3 L3-4 L4-5 with grade 1 spondylolisthesis L3-4 L4-5  Discharge Diagnoses: same   Discharged Condition: good  Hospital Course: The patient was admitted on 01/13/2020 and taken to the operating room where the patient underwent PLIF L2-3, L3-4, L4-5. The patient tolerated the procedure well and was taken to the recovery room and then to the floor in stable condition. The hospital course was routine. There were no complications. The wound remained clean dry and intact. Pt had appropriate back soreness. No complaints of leg pain or new N/T/W. The patient remained afebrile with stable vital signs, and tolerated a regular diet. The patient continued to increase activities, and pain was well controlled with oral pain medications.   Consults: None  Significant Diagnostic Studies:  Results for orders placed or performed during the hospital encounter of 01/13/20  SARS Coronavirus 2 by RT PCR (hospital order, performed in [place] hospital lab) Nasopharyngeal Nasopharyngeal Swab   Specimen: Nasopharyngeal Swab  Result Value Ref Range   SARS Coronavirus 2 NEGATIVE NEGATIVE  CBC per protocol  Result Value Ref Range   WBC 7.8 4.0 - 10.5 K/uL   RBC 3.74 (L) 3.87 - 5.11 MIL/uL   Hemoglobin 11.6 (L) 12.0 - 15.0 g/dL   HCT 33.7 (L) 36 - 46 %   MCV 90.1 80.0 - 100.0 fL   MCH 31.0 26.0 - 34.0 pg   MCHC 34.4 30.0 - 36.0 g/dL   RDW 13.0 11.5 - 15.5 %   Platelets 145 (L) 150 - 400 K/uL   nRBC 0.0 0.0 - 0.2 %  Basic metabolic panel per protocol  Result Value Ref Range   Sodium 130 (L) 135 - 145 mmol/L   Potassium 3.2 (L) 3.5 - 5.1 mmol/L   Chloride 95 (L) 98 - 111 mmol/L   CO2 24 22 - 32 mmol/L   Glucose, Bld 155 (H) 70 - 99 mg/dL   BUN 27 (H) 8 - 23 mg/dL   Creatinine, Ser 1.59 (H) 0.44  - 1.00 mg/dL   Calcium 8.9 8.9 - 10.3 mg/dL   GFR, Estimated 34 (L) >60 mL/min   Anion gap 11 5 - 15  Urinalysis, Routine w reflex microscopic PATH Other  Result Value Ref Range   Color, Urine AMBER (A) YELLOW   APPearance TURBID (A) CLEAR   Specific Gravity, Urine 1.012 1.005 - 1.030   pH 5.0 5.0 - 8.0   Glucose, UA NEGATIVE NEGATIVE mg/dL   Hgb urine dipstick SMALL (A) NEGATIVE   Bilirubin Urine NEGATIVE NEGATIVE   Ketones, ur NEGATIVE NEGATIVE mg/dL   Protein, ur 100 (A) NEGATIVE mg/dL   Nitrite NEGATIVE NEGATIVE   Leukocytes,Ua MODERATE (A) NEGATIVE   RBC / HPF >50 (H) 0 - 5 RBC/hpf   WBC, UA >50 (H) 0 - 5 WBC/hpf   Bacteria, UA FEW (A) NONE SEEN   Squamous Epithelial / LPF 0-5 0 - 5   WBC Clumps PRESENT   Basic metabolic panel  Result Value Ref Range   Sodium 134 (L) 135 - 145 mmol/L   Potassium 3.6 3.5 - 5.1 mmol/L   Chloride 101 98 - 111 mmol/L   CO2 24 22 - 32 mmol/L   Glucose, Bld 214 (H) 70 - 99 mg/dL   BUN 30 (H) 8 - 23 mg/dL   Creatinine, Ser 1.45 (H) 0.44 -  1.00 mg/dL   Calcium 8.0 (L) 8.9 - 10.3 mg/dL   GFR, Estimated 38 (L) >60 mL/min   Anion gap 9 5 - 15  CBC with Differential/Platelet  Result Value Ref Range   WBC 6.2 4.0 - 10.5 K/uL   RBC 2.51 (L) 3.87 - 5.11 MIL/uL   Hemoglobin 7.8 (L) 12.0 - 15.0 g/dL   HCT 23.4 (L) 36 - 46 %   MCV 93.2 80.0 - 100.0 fL   MCH 31.1 26.0 - 34.0 pg   MCHC 33.3 30.0 - 36.0 g/dL   RDW 13.2 11.5 - 15.5 %   Platelets 112 (L) 150 - 400 K/uL   nRBC 0.0 0.0 - 0.2 %   Neutrophils Relative % 89 %   Neutro Abs 5.6 1.7 - 7.7 K/uL   Lymphocytes Relative 7 %   Lymphs Abs 0.4 (L) 0.7 - 4.0 K/uL   Monocytes Relative 3 %   Monocytes Absolute 0.2 0.1 - 1.0 K/uL   Eosinophils Relative 0 %   Eosinophils Absolute 0.0 0.0 - 0.5 K/uL   Basophils Relative 0 %   Basophils Absolute 0.0 0.0 - 0.1 K/uL   Immature Granulocytes 1 %   Abs Immature Granulocytes 0.03 0.00 - 0.07 K/uL  Hemoglobin and hematocrit, blood  Result Value Ref Range    Hemoglobin 9.1 (L) 12.0 - 15.0 g/dL   HCT 26.7 (L) 36 - 46 %  CBC  Result Value Ref Range   WBC 5.7 4.0 - 10.5 K/uL   RBC 2.92 (L) 3.87 - 5.11 MIL/uL   Hemoglobin 8.9 (L) 12.0 - 15.0 g/dL   HCT 26.5 (L) 36 - 46 %   MCV 90.8 80.0 - 100.0 fL   MCH 30.5 26.0 - 34.0 pg   MCHC 33.6 30.0 - 36.0 g/dL   RDW 15.2 11.5 - 15.5 %   Platelets 123 (L) 150 - 400 K/uL   nRBC 0.0 0.0 - 0.2 %  CBC  Result Value Ref Range   WBC 5.9 4.0 - 10.5 K/uL   RBC 3.26 (L) 3.87 - 5.11 MIL/uL   Hemoglobin 9.7 (L) 12.0 - 15.0 g/dL   HCT 28.9 (L) 36 - 46 %   MCV 88.7 80.0 - 100.0 fL   MCH 29.8 26.0 - 34.0 pg   MCHC 33.6 30.0 - 36.0 g/dL   RDW 14.7 11.5 - 15.5 %   Platelets 165 150 - 400 K/uL   nRBC 0.0 0.0 - 0.2 %  Type and screen [place]  Result Value Ref Range   ABO/RH(D) A POS    Antibody Screen NEG    Sample Expiration 01/16/2020,2359    Unit Number [id number]    Blood Component Type RED CELLS,LR    Unit division 00    Status of Unit ISSUED,FINAL    Transfusion Status OK TO TRANSFUSE    Crossmatch Result Compatible    Unit Number [id number]    Blood Component Type RED CELLS,LR    Unit division 00    Status of Unit ISSUED,FINAL    Transfusion Status OK TO TRANSFUSE    Crossmatch Result      Compatible Performed at [facility], [address], [place], [place] [address]   ABO/Rh  Result Value Ref Range   ABO/RH(D)      A POS Performed at [facility] [address], [place], [place] [address]   Prepare RBC (crossmatch)  Result Value Ref Range   Order Confirmation  ORDER PROCESSED BY BLOOD BANK Performed at [facility], [place] [address], [place], [place] [address]   [facility]  Result Value Ref Range   ISSUE DATE / TIME 694854627035    Blood Product Unit Number [id number]    PRODUCT CODE [id number]    Unit Type and Rh 6200    Blood Product Expiration Date 202112032359    ISSUE DATE / TIME 101751025852    Blood Product Unit Number  [id number]    PRODUCT CODE [id number]    Unit Type and Rh 6200    Blood Product Expiration Date 761950932671     DG Lumbar Spine 2-3 Views  Result Date: 01/13/2020 CLINICAL DATA:  L2-L5 PLIF EXAM: LUMBAR SPINE - 2-3 VIEW; DG C-ARM 1-60 MIN COMPARISON:  12/03/2019 FLUOROSCOPY TIME:  Radiation Exposure Index (as provided by the fluoroscopic device): 91.6 mGy If the device does not provide the exposure index: Fluoroscopy Time:  1 minutes 32 seconds Number of Acquired Images:  3 FINDINGS: Initial images demonstrate surgical instrument at the L3-4 interspace. Numbering nomenclature is similar to that utilized on prior MRI. Subsequent interbody fusion at L2-3, L3-4 and L4-5 is noted with pedicle screw placement from L2-L5. IMPRESSION: Fusion from L2-L5. Electronically Signed   By: Inez Catalina M.D.   On: 01/13/2020 14:35   DG C-Arm 1-60 Min  Result Date: 01/13/2020 CLINICAL DATA:  L2-L5 PLIF EXAM: LUMBAR SPINE - 2-3 VIEW; DG C-ARM 1-60 MIN COMPARISON:  12/03/2019 FLUOROSCOPY TIME:  Radiation Exposure Index (as provided by the fluoroscopic device): 91.6 mGy If the device does not provide the exposure index: Fluoroscopy Time:  1 minutes 32 seconds Number of Acquired Images:  3 FINDINGS: Initial images demonstrate surgical instrument at the L3-4 interspace. Numbering nomenclature is similar to that utilized on prior MRI. Subsequent interbody fusion at L2-3, L3-4 and L4-5 is noted with pedicle screw placement from L2-L5. IMPRESSION: Fusion from L2-L5. Electronically Signed   By: Inez Catalina M.D.   On: 01/13/2020 14:35    Antibiotics:  Anti-infectives (From admission, onward)   Start     Dose/Rate Route Frequency Ordered Stop   01/13/20 2330  ceFAZolin (ANCEF) IVPB 2g/100 mL premix        2 g 200 mL/hr over 30 Minutes Intravenous Every 8 hours 01/13/20 2300 01/15/20 1712   01/13/20 0700  ceFAZolin (ANCEF) IVPB 2g/100 mL premix        2 g 200 mL/hr over 30 Minutes Intravenous On call to O.R. 01/13/20  0652 01/13/20 1440      Discharge Exam: Blood pressure 107/80, pulse 91, temperature 99 F (37.2 C), temperature source Oral, resp. rate 16, height 5\' 5"  (1.651 m), weight 108.9 kg, last menstrual period 03/05/2000, SpO2 90 %. Neurologic: Grossly normal Ambulating and voiding well, incision cdi  Discharge Medications:   Allergies as of 01/21/2020   No Known Allergies     Medication List    TAKE these medications   allopurinol 100 MG tablet Commonly known as: ZYLOPRIM Take 100 mg by mouth daily.   amLODipine 2.5 MG tablet Commonly known as: NORVASC Take 2.5 mg by mouth daily.   aspirin 81 MG EC tablet Take 1 tablet (81 mg total) by mouth daily.   busPIRone 7.5 MG tablet Commonly known as: BUSPAR Take 7.5 mg by mouth 2 (two) times daily.   chlorthalidone 25 MG tablet Commonly known as: HYGROTON Take 0.5 tablets (12.5 mg total) by mouth every morning. Needs to schedule appt for further refills. What changed:  additional instructions   diclofenac Sodium 1 % Gel Commonly known as: VOLTAREN Apply as directed   furosemide 20 MG tablet Commonly known as: LASIX Take 40 mg by mouth daily.   HYDROcodone-acetaminophen 10-325 MG tablet Commonly known as: NORCO Take 0.5 tablets by mouth every 6 (six) hours as needed for moderate pain.   levothyroxine 75 MCG tablet Commonly known as: SYNTHROID Take 75 mcg by mouth daily before breakfast.   losartan 100 MG tablet Commonly known as: COZAAR TAKE 1 TABLET BY MOUTH EVERY DAY   Lyrica 300 MG capsule Generic drug: pregabalin Take 300 mg by mouth 2 (two) times daily.   meloxicam 15 MG tablet Commonly known as: MOBIC Take 1 tablet (15 mg total) by mouth daily.   methocarbamol 500 MG tablet Commonly known as: Robaxin Take 1 tablet (500 mg total) by mouth 4 (four) times daily.   metoprolol succinate 50 MG 24 hr tablet Commonly known as: TOPROL-XL Take 50 mg by mouth daily. Take with or immediately following a meal.    Oxycodone HCl 10 MG Tabs Take 1 tablet (10 mg total) by mouth every 6 (six) hours as needed for severe pain ((score 7 to 10)).   phentermine 30 MG capsule Take 1 capsule (30 mg total) by mouth every morning.   rosuvastatin 10 MG tablet Commonly known as: CRESTOR TAKE 1 TABLET BY MOUTH EVERY DAY   triamterene-hydrochlorothiazide 37.5-25 MG tablet Commonly known as: MAXZIDE-25 Take 1 tablet by mouth daily.       Disposition: SNF   Final Dx: PLIF L2-3, L3-4, L4-5  Discharge Instructions    Call MD for:  difficulty breathing, headache or visual disturbances   Complete by: As directed    Call MD for:  hives   Complete by: As directed    Call MD for:  persistant nausea and vomiting   Complete by: As directed    Call MD for:  redness, tenderness, or signs of infection (pain, swelling, redness, odor or green/yellow discharge around incision site)   Complete by: As directed    Call MD for:  severe uncontrolled pain   Complete by: As directed    Call MD for:  temperature >100.4   Complete by: As directed    Diet - low sodium heart healthy   Complete by: As directed    Increase activity slowly   Complete by: As directed    Lifting restrictions   Complete by: As directed    No lifting more than 8 lbs   Remove dressing in 24 hours   Complete by: As directed          Signed: Ocie Cornfield Greyson Riccardi 01/21/2020, 2:13 PM

## 2020-01-21 NOTE — Progress Notes (Signed)
IV removed, attempted to call report to [facility], was advised they will have nurse call me back.  Discharge info placed in packet for [facility] including signed prescriptions for Oxycodone and Robaxin.

## 2020-01-21 NOTE — TOC Transition Note (Signed)
Transition of Care [facility]) - CM/SW Discharge Note   Patient Details  Name: Lashana Spang MRN: [id number] Date of Birth: [birth year]  Transition of [facility]) CM/SW Contact:  Vinie Sill, LCSWA Phone Number: 01/21/2020, 2:57 PM  Patient will DC OI:[id number]  DC Date: 11818/2021 Family Notified:Jack,spouse  Transport By: Corey Harold  Per MD patient is ready for discharge. RN, patient, and facility notified of DC. Discharge Summary sent to facility. RN given number for [phone]. Ambulance transport requested for patient.   Clinical Social Worker signing off.  Thurmond Butts, MSW, LCSWA Clinical Social Worker    Clinical Narrative:       Final next level of care: Skilled Nursing Facility Barriers to Discharge: Barriers Resolved   Patient Goals and CMS Choice        Discharge Placement PASRR number recieved: 01/18/20            Patient chooses bed at: [facility] Patient to be transferred to facility by: [facility] Name of family member notified: Jack,spouse Patient and family notified of of transfer: 01/21/20  Discharge Plan and Services In-house Referral: Clinical Social Work                                   Social Determinants of Health ([place]) Interventions     Readmission Risk Interventions No flowsheet data found.

## 2020-01-21 NOTE — Progress Notes (Deleted)
Report called to [facility], IV removed, pt to be transported via [facility].

## 2020-03-05 ENCOUNTER — Other Ambulatory Visit: Payer: Self-pay | Admitting: Cardiology

## 2020-03-05 DIAGNOSIS — I10 Essential (primary) hypertension: Secondary | ICD-10-CM

## 2020-04-05 ENCOUNTER — Other Ambulatory Visit: Payer: Self-pay | Admitting: Cardiology

## 2020-04-05 DIAGNOSIS — E782 Mixed hyperlipidemia: Secondary | ICD-10-CM

## 2020-05-09 ENCOUNTER — Telehealth: Payer: Self-pay | Admitting: Podiatry

## 2020-05-09 NOTE — Telephone Encounter (Signed)
Patient stated she was recently diagnosed with Neuropathy and would like to know if you could possible treat, Please Advise   Patient has also requested return call [phone]

## 2020-05-15 ENCOUNTER — Other Ambulatory Visit: Payer: Self-pay | Admitting: Cardiology

## 2020-05-15 DIAGNOSIS — E782 Mixed hyperlipidemia: Secondary | ICD-10-CM

## 2020-05-24 NOTE — Progress Notes (Signed)
Primary Physician/Referring:  Secundino Ginger, PA-C  Patient ID: Quentin Mulling, female    DOB: December 04, 1945, 75 y.o.   MRN: [id number]  Chief Complaint  Patient presents with  . Hypertension  . Hyperlipidemia  . Medication Management  . Follow-up    HPI: Neka Bise  is a 75 y.o. female  with fibromyalgia, anxiety, depression, hyperlipidemia, hypertension.   Patient was last seen in our office 11/03/2018 by Dr. Einar Gip, at that time she was stable from a cardiovascular standpoint and recommended as needed follow-up.  She now presents for follow-up of hypertension, hyperlipidemia, and medication management.  Patient has no specific complaints today.  She states she made an appointment in our office in order to have medications refilled.  She denies chest pain, palpitations, syncope, near syncope.  Denies dyspnea, leg swelling, insomnia, PND.  She had back surgery in November, and is still struggling with chronic pain.  She continues to progress in physical therapy, however her activity is limited.  She is presently walking several laps around her backyard daily without issue.  Patient notably has been without chlorthalidone for approximately 2-3 months.  She does not monitor her blood pressure on a regular basis at home.  She follows closely with Bubba Hales, PA-C as PCP.  Past Medical History:  Diagnosis Date  . Anxiety   . Depression   . Fibromyalgia   . GERD (gastroesophageal reflux disease)   . Gout 04/2017  . Hyperlipidemia   . Hypertension   . Hypothyroidism       . Low kidney function    followed by Dr Alyson Ingles  . Spinal stenosis     Past Surgical History:  Procedure Laterality Date  . Carpel Tunnel Surgery Right 10/17/2018  . CATARACT EXTRACTION W/ INTRAOCULAR LENS IMPLANT Bilateral 10/25/2015   Right eye was first.  Left eye done 10/17.  . CERVICAL BIOPSY  W/ LOOP ELECTRODE EXCISION  12/2008   hx CIN II   Family History  Problem Relation Age of Onset   . Osteoporosis Mother   . Hypertension Mother   . Uterine cancer Mother   . Heart disease Mother        Atrial fibrillation  . Breast cancer Sister   . Osteoporosis Sister   . Diabetes Sister   . Diabetes Father   . Heart disease Sister    Social History   Tobacco Use  . Smoking status: Former Smoker    Packs/day: 0.25    Years: 5.00    Pack years: 1.25    Types: Cigarettes    Quit date: 1975    Years since quitting: 47.2  . Smokeless tobacco: Never Used  . Tobacco comment: in the 70s  Substance Use Topics  . Alcohol use: Yes    Alcohol/week: 1.0 standard drink    Types: 1 Standard drinks or equivalent per week    Comment: Occasional   Marital Status: Married  Current Outpatient Medications on File Prior to Visit  Medication Sig Dispense Refill  . allopurinol (ZYLOPRIM) 100 MG tablet Take 100 mg by mouth daily.    Marland Kitchen amLODipine (NORVASC) 2.5 MG tablet Take 2.5 mg by mouth daily.    . busPIRone (BUSPAR) 7.5 MG tablet Take 7.5 mg by mouth 2 (two) times daily.    Marland Kitchen HYDROcodone-acetaminophen (NORCO) 10-325 MG tablet Take 0.5 tablets by mouth every 6 (six) hours as needed for moderate pain.     Marland Kitchen levothyroxine (SYNTHROID) 75 MCG tablet Take 75 mcg  by mouth daily before breakfast.    . LYRICA 300 MG capsule Take 300 mg by mouth 2 (two) times daily.     . meloxicam (MOBIC) 15 MG tablet Take 1 tablet (15 mg total) by mouth daily. 90 tablet 3  . metoprolol succinate (TOPROL-XL) 50 MG 24 hr tablet Take 50 mg by mouth daily. Take with or immediately following a meal.     No current facility-administered medications on file prior to visit.   Review of Systems  Constitutional: Negative for chills, decreased appetite, malaise/fatigue and weight gain.  Cardiovascular: Negative for chest pain, claudication, dyspnea on exertion, leg swelling, near-syncope, orthopnea, palpitations, paroxysmal nocturnal dyspnea and syncope.  Respiratory: Negative for shortness of breath, sputum  production and wheezing.   Endocrine: Negative for cold intolerance.  Hematologic/Lymphatic: Does not bruise/bleed easily.  Musculoskeletal: Positive for back pain (severe spinal stenosis, s/p surgica intervention) and joint pain. Negative for joint swelling.  Gastrointestinal: Negative for abdominal pain, anorexia, change in bowel habit, hematochezia and melena.  Neurological: Negative for dizziness, headaches, light-headedness and weakness.  Psychiatric/Behavioral: Negative for depression and substance abuse.  All other systems reviewed and are negative.     Objective  Blood pressure 119/70, pulse 80, temperature 97.9 F (36.6 C), temperature source Temporal, resp. rate 16, height 5' (1.524 m), weight 228 lb 9.6 oz (103.7 kg), last menstrual period 03/05/2000, SpO2 98 %. Body mass index is 44.65 kg/m.  Physical Exam Vitals reviewed.  Constitutional:      General: She is not in acute distress.    Appearance: She is well-developed.     Comments: Moderately obese  HENT:     Head: Normocephalic and atraumatic.  Eyes:     Conjunctiva/sclera: Conjunctivae normal.  Neck:     Thyroid: No thyromegaly.     Comments: Short neck and difficult to evaluate JVP Cardiovascular:     Rate and Rhythm: Normal rate and regular rhythm.     Pulses: Normal pulses and intact distal pulses.          Carotid pulses are 2+ on the right side with bruit and 2+ on the left side.      Radial pulses are 2+ on the right side and 2+ on the left side.       Popliteal pulses are 2+ on the right side and 2+ on the left side.       Dorsalis pedis pulses are 2+ on the right side and 2+ on the left side.       Posterior tibial pulses are 2+ on the right side and 2+ on the left side.     Heart sounds: Normal heart sounds, S1 normal and S2 normal. No murmur heard. No gallop.   Pulmonary:     Effort: Pulmonary effort is normal. No respiratory distress.     Breath sounds: Normal breath sounds. No wheezing, rhonchi or  rales.  Abdominal:     General: Bowel sounds are normal.     Palpations: Abdomen is soft.     Comments: Obese. Pannus present  Musculoskeletal:        General: Normal range of motion.     Cervical back: Neck supple.     Right lower leg: No edema.     Left lower leg: No edema.  Skin:    General: Skin is warm and dry.  Neurological:     Mental Status: She is alert.    Laboratory examination:   External labs:  10/07/2019: Glucose 101, sodium  140, potassium 4.2, BUN 24, creatinine 0.99, GFR 57 Hemoglobin 12.1, hematocrit 34.8, platelet 178, MCV 89.7 A1c 5.7% TSH 0.746  06/09/2018: Potassium 4.3, serum glucose 122 mg, BUN 18, creatinine 1.2, AST 36, AST 54 both elevated.  Total cholesterol 222, triglycerides 215, HDL 54, LDL 127.  Uric acid 8.4, elevated.  ANA negative.  Rheumatoid factor negative.  CMP Latest Ref Rng & Units 01/13/2020 01/13/2020 10/22/2018  Glucose 70 - 99 mg/dL 214(H) 155(H) 107(H)  BUN 8 - 23 mg/dL 30(H) 27(H) 19  Creatinine 0.44 - 1.00 mg/dL 1.45(H) 1.59(H) 1.01(H)  Sodium 135 - 145 mmol/L 134(L) 130(L) 138  Potassium 3.5 - 5.1 mmol/L 3.6 3.2(L) 3.8  Chloride 98 - 111 mmol/L 101 95(L) 99  CO2 22 - 32 mmol/L 24 24 22   Calcium 8.9 - 10.3 mg/dL 8.0(L) 8.9 9.8  Total Protein 6.0 - 8.5 g/dL - - 6.8  Total Bilirubin 0.0 - 1.2 mg/dL - - 1.3(H)  Alkaline Phos 39 - 117 IU/L - - 61  AST 0 - 40 IU/L - - 32  ALT 0 - 32 IU/L - - 43(H)   CBC Latest Ref Rng & Units 01/16/2020 01/15/2020 01/14/2020  WBC 4.0 - 10.5 K/uL 5.9 5.7 -  Hemoglobin 12.0 - 15.0 g/dL 9.7(L) 8.9(L) 9.1(L)  Hematocrit 36.0 - 46.0 % 28.9(L) 26.5(L) 26.7(L)  Platelets 150 - 400 K/uL 165 123(L) -   Lipid Panel     Component Value Date/Time   CHOL 121 10/22/2018 0815   TRIG 104 10/22/2018 0815   HDL 45 10/22/2018 0815   CHOLHDL 6.1 08/29/2013 0102   VLDL UNABLE TO CALCULATE IF TRIGLYCERIDE OVER 400 mg/dL 08/29/2013 0102   LDLCALC 55 10/22/2018 0815   LDLDIRECT 58 10/22/2018 0815   HEMOGLOBIN  A1C No results found for: HGBA1C, MPG TSH No results for input(s): TSH in the last 8760 hours.  Cardiac Studies:  Lower extremity venous duplex 05/29/2018: No evidence of deep venous thrombosis. Left leg  Echocardiogram [facility] 06/17/2018: Normal LV systolic function, EF 45-80%.  Mild LVH.  No significant valvular abnormality.  Stress Echo 09/25/2013: Patient exercised on Bruce protocol for 7:50 minutes and achieved 10.8 METS.  Achieved 107% of MPHR. - Stress echocardiogram with no chest pain nondiagnostic ST changes; no stress-induced wall motion abnormalities; interpreted as normal stress echocardiogram.  Lexiscan Myoview Stress Test 08/06/2018: Nondiagnostic ECG stress due to pharmacologic stress protocol. Normal myocardial perfusion.  Stress LV EF: 72%.  Low risk study.  EKG   EKG 05/25/2020: Sinus rhythm at a rate of 75 bpm with single PAC.  Left atrial enlargement.  Left axis, left anterior fascicular block.  Incomplete right bundle branch block.  Nonspecific T wave abnormality.  Compared to EKG 07/17/2018, no significant change.  EKG 07/17/2018: Normal sinus rhythm at rate of 75 bpm, left atrial enlargement, normal axis.  Incomplete right bundle branch block.  No evidence of ischemia.  Assessment     ICD-10-CM   1. Essential hypertension  I10 EKG 12-Lead    losartan (COZAAR) 100 MG tablet  2. Mixed hyperlipidemia  E78.2 rosuvastatin (CRESTOR) 10 MG tablet  3. Class 2 severe obesity due to excess calories with serious comorbidity and body mass index (BMI) of 37.0 to 37.9 in adult ([place])  E66.01    Z68.37   4. Right carotid bruit  R09.89 PCV CAROTID DUPLEX (BILATERAL)      Meds ordered this encounter  Medications  . rosuvastatin (CRESTOR) 10 MG tablet    Sig: Take 1  tablet (10 mg total) by mouth daily.    Dispense:  90 tablet    Refill:  3  . losartan (COZAAR) 100 MG tablet    Sig: Take 1 tablet (100 mg total) by mouth daily.    Dispense:  90 tablet     Refill:  3   Medications Discontinued During This Encounter  Medication Reason  . chlorthalidone (HYGROTON) 25 MG tablet Discontinued by provider  . rosuvastatin (CRESTOR) 10 MG tablet Reorder  . losartan (COZAAR) 100 MG tablet Reorder    Recommendations:   Ms. Kamri Gotsch is a pleasant 75 y.o.Caucasian femalewith fibromyalgia, anxiety, depression, hyperlipidemia, hypertension  She was last seen in our office in August 2020 by Dr. Einar Gip and stable from a cardiovascular standpoint, therefore recommended for as needed follow-up.  Patient now presents requesting refills of her antihypertensive medications.  Patient has no other specific complaints today.  She is asymptomatic from a cardiovascular standpoint and her EKG today is without significant changes compared to previous.  Blood pressure is well controlled, despite discontinuation of chlorthalidone.  Therefore we will not reinitiate chlorthalidone at this time.  Recommend patient continue to being 2.5 mg daily, losartan 100 mg daily, metoprolol succinate 50 mg daily.  In regard to hyperlipidemia, recommend patient continue rosuvastatin 10 mg daily.  She is scheduled for follow-up with PCP who plans to do blood work.  Will defer further management of hyperlipidemia to primary care.  On exam today new right-sided carotid bruits noted, therefore will obtain bilateral carotid artery duplex.  Unless there is significant abnormality on carotid artery duplex patient's hypertension and hyperlipidemia can likely be managed by PCP.  Recommend continued primary prevention.  Discussed with patient that if her PCP is comfortable managing hypertension and hyperlipidemia, they may continue to do so however if she would prefer to follow in our office for management of this I am happy to do so.  Counseled patient regarding diet and lifestyle modifications as well as weight loss in order to reduce cardiovascular risk, she verbalized understanding  agreement.  Follow-up as needed, unless significant abnormality on carotid artery duplex.   Alethia Berthold, PA-C 05/25/2020, 4:15 PM Office: [phone]

## 2020-05-25 ENCOUNTER — Encounter: Payer: Self-pay | Admitting: Student

## 2020-05-25 ENCOUNTER — Other Ambulatory Visit: Payer: Self-pay

## 2020-05-25 ENCOUNTER — Ambulatory Visit: Payer: Medicare Other | Admitting: Student

## 2020-05-25 VITALS — BP 119/70 | HR 80 | Temp 97.9°F | Resp 16 | Ht 60.0 in | Wt 228.6 lb

## 2020-05-25 DIAGNOSIS — E782 Mixed hyperlipidemia: Secondary | ICD-10-CM

## 2020-05-25 DIAGNOSIS — I10 Essential (primary) hypertension: Secondary | ICD-10-CM

## 2020-05-25 DIAGNOSIS — R0989 Other specified symptoms and signs involving the circulatory and respiratory systems: Secondary | ICD-10-CM

## 2020-05-25 MED ORDER — ROSUVASTATIN CALCIUM 10 MG PO TABS: 10.0000 mg | ORAL_TABLET | Freq: Every day | ORAL | 3 refills | Status: DC

## 2020-05-25 MED ORDER — LOSARTAN POTASSIUM 100 MG PO TABS: 100.0000 mg | ORAL_TABLET | Freq: Every day | ORAL | 3 refills | Status: DC

## 2020-05-30 ENCOUNTER — Emergency Department (HOSPITAL_BASED_OUTPATIENT_CLINIC_OR_DEPARTMENT_OTHER)
Admission: EM | Admit: 2020-05-30 | Discharge: 2020-05-30 | Disposition: A | Discharge status: Home / Self Care | Payer: Medicare Other | Attending: Emergency Medicine | Admitting: Emergency Medicine

## 2020-05-30 ENCOUNTER — Other Ambulatory Visit: Payer: Self-pay

## 2020-05-30 ENCOUNTER — Emergency Department (HOSPITAL_BASED_OUTPATIENT_CLINIC_OR_DEPARTMENT_OTHER): Payer: Medicare Other | Admitting: Radiology

## 2020-05-30 ENCOUNTER — Encounter (HOSPITAL_BASED_OUTPATIENT_CLINIC_OR_DEPARTMENT_OTHER): Payer: Self-pay | Admitting: Obstetrics and Gynecology

## 2020-05-30 DIAGNOSIS — Z79899 Other long term (current) drug therapy: Secondary | ICD-10-CM | POA: Insufficient documentation

## 2020-05-30 DIAGNOSIS — I10 Essential (primary) hypertension: Secondary | ICD-10-CM | POA: Diagnosis not present

## 2020-05-30 DIAGNOSIS — M79651 Pain in right thigh: Secondary | ICD-10-CM | POA: Diagnosis present

## 2020-05-30 DIAGNOSIS — Z87891 Personal history of nicotine dependence: Secondary | ICD-10-CM | POA: Diagnosis not present

## 2020-05-30 DIAGNOSIS — E039 Hypothyroidism, unspecified: Secondary | ICD-10-CM | POA: Diagnosis not present

## 2020-05-30 DIAGNOSIS — M545 Low back pain, unspecified: Secondary | ICD-10-CM | POA: Diagnosis not present

## 2020-05-30 MED ORDER — DICLOFENAC SODIUM 3 % EX GEL: 2.0000 g | Freq: Two times a day (BID) | CUTANEOUS | 0 refills | Status: DC | PRN

## 2020-05-30 MED ORDER — OXYCODONE-ACETAMINOPHEN 5-325 MG PO TABS
2.0000 | ORAL_TABLET | Freq: Once | ORAL | Status: AC
  Administered 2020-05-30: 2 via ORAL
  Filled 2020-05-30: qty 2

## 2020-05-30 MED ORDER — DICLOFENAC SODIUM 1 % EX GEL: 2.0000 g | Freq: Once | CUTANEOUS | Status: DC

## 2020-05-30 NOTE — ED Notes (Signed)
Pt discharged home after verbalizing understanding of discharge instructions; nad noted. 

## 2020-05-30 NOTE — ED Provider Notes (Signed)
[facility] EMERGENCY DEPT Provider Note   CSN: [id number] Arrival date & time: 05/30/20  1455     History Chief Complaint  Patient presents with  . Leg Pain    Patricia Parks is a 75 y.o. female.  The history is provided by the patient and medical records.  Leg Pain  Patricia Parks is a 75 y.o. female who presents to the Emergency Department complaining of leg pain. She presents the emergency department accompanied by her husband for evaluation of acute on chronic right lower extremity pain. She has a history of back pain and had lumbar surgery in November 2021. She has experienced persistent left low back pain since the surgery. She also reports that she has been experiencing a dull ache in her right lateral thigh and right toes since the surgery. Her symptoms have been gradually progressing over the last several days to weeks despite hydrocodone and physical therapy and acupuncture. Last night the pain severely worsened and she had difficulty sleeping despite taking her home medication. Pain is located in the right lateral thigh and described as an aching sensation. She also has tingling to her toes. No fevers, abdominal pain, nausea, vomiting, weakness, urinary or bowel incontinence. She has neuropathy in bilateral feet. No recent injuries. She is scheduled to follow-up with Dr. Saintclair Halsted later this week regarding her symptoms.    Past Medical History:  Diagnosis Date  . Anxiety   . Depression   . Fibromyalgia   . GERD (gastroesophageal reflux disease)   . Gout 04/2017  . Hyperlipidemia   . Hypertension   . Hypothyroidism       . Low kidney function    followed by Dr Alyson Ingles  . Spinal stenosis     Patient Active Problem List   Diagnosis Date Noted  . Spondylolisthesis at L4-L5 level 01/13/2020  . Pain in left knee 05/28/2017  . Elevated lipids 12/30/2014  . History of cervical dysplasia 12/30/2014  . Essential hypertension 09/10/2013  . Chest pain  08/28/2013    Past Surgical History:  Procedure Laterality Date  . Carpel Tunnel Surgery Right 10/17/2018  . CATARACT EXTRACTION W/ INTRAOCULAR LENS IMPLANT Bilateral 10/25/2015   Right eye was first.  Left eye done 10/17.  . CERVICAL BIOPSY  W/ LOOP ELECTRODE EXCISION  12/2008   hx CIN II     OB History    Gravida  2   Para  2   Term  2   Preterm      AB      Living  2     SAB      IAB      Ectopic      Multiple      Live Births              Family History  Problem Relation Age of Onset  . Osteoporosis Mother   . Hypertension Mother   . Uterine cancer Mother   . Heart disease Mother        Atrial fibrillation  . Breast cancer Sister   . Osteoporosis Sister   . Diabetes Sister   . Diabetes Father   . Heart disease Sister     Social History   Tobacco Use  . Smoking status: Former Smoker    Packs/day: 0.25    Years: 5.00    Pack years: 1.25    Types: Cigarettes    Quit date: 1975    Years since quitting: 47.2  . Smokeless  tobacco: Never Used  . Tobacco comment: in the 88s  Vaping Use  . Vaping Use: Never used  Substance Use Topics  . Alcohol use: Yes    Alcohol/week: 1.0 standard drink    Types: 1 Standard drinks or equivalent per week    Comment: Occasional  . Drug use: No    Home Medications Prior to Admission medications   Medication Sig Start Date End Date Taking? Authorizing Provider  Diclofenac Sodium 3 % GEL Apply 2 g topically 2 (two) times daily as needed. 05/30/20  Yes Quintella Reichert, MD  allopurinol (ZYLOPRIM) 100 MG tablet Take 100 mg by mouth daily.    [provider]  amLODipine (NORVASC) 2.5 MG tablet Take 2.5 mg by mouth daily.    [provider]  busPIRone (BUSPAR) 7.5 MG tablet Take 7.5 mg by mouth 2 (two) times daily. 01/07/19   [provider]  HYDROcodone-acetaminophen (NORCO) 10-325 MG tablet Take 0.5 tablets by mouth every 6 (six) hours as needed for moderate pain.     [provider]  levothyroxine (SYNTHROID) 75 MCG tablet Take 75 mcg by mouth daily before breakfast.    [provider]  losartan (COZAAR) 100 MG tablet Take 1 tablet (100 mg total) by mouth daily. 05/25/20   Cantwell, Celeste C, PA-C  LYRICA 300 MG capsule Take 300 mg by mouth 2 (two) times daily.  01/14/19   [provider]  meloxicam (MOBIC) 15 MG tablet Take 1 tablet (15 mg total) by mouth daily. 07/27/19   Edrick Kins, DPM  metoprolol succinate (TOPROL-XL) 50 MG 24 hr tablet Take 50 mg by mouth daily. Take with or immediately following a meal.    [provider]  Omega-3 Fatty Acids (SALMON OIL) 200 MG CAPS Take 2 capsules by mouth daily.    [provider]  rosuvastatin (CRESTOR) 10 MG tablet Take 1 tablet (10 mg total) by mouth daily. 05/25/20   Cantwell, Celeste C, PA-C    Allergies    Patient has no known allergies.  Review of Systems   Review of Systems  All other systems reviewed and are negative.   Physical Exam Updated Vital Signs BP (!) 156/91 (BP Location: Right Arm)   Pulse 71   Temp 98.2 F (36.8 C) (Oral)   Resp 20   Ht 5' (1.524 m)   Wt 104 kg   LMP 03/05/2000   SpO2 99%   BMI 44.78 kg/m   Physical Exam Vitals and nursing note reviewed.  Constitutional:      Appearance: She is well-developed.  HENT:     Head: Normocephalic and atraumatic.  Cardiovascular:     Rate and Rhythm: Normal rate and regular rhythm.  Pulmonary:     Effort: Pulmonary effort is normal. No respiratory distress.  Abdominal:     Palpations: Abdomen is soft.     Tenderness: There is no abdominal tenderness. There is no guarding or rebound.  Musculoskeletal:        General: No tenderness.     Comments: 2+ DP pulses bilaterally.  Skin:    General: Skin is warm and dry.  Neurological:     Mental Status: She is alert and oriented to person, place, and time.     Comments: Altered sensation to light touch and bilateral feet. Five out of five  dorsiflexion/plantarflexion bilaterally. Five out of five proximal bilateral lower extremity strength. 2+ right patellar reflex.  Psychiatric:        Behavior: Behavior  normal.     ED Results / Procedures / Treatments   Labs (all labs ordered are listed, but only abnormal results are displayed) Labs Reviewed - No data to display  EKG None  Radiology DG Lumbar Spine Complete  Result Date: 05/30/2020 CLINICAL DATA:  Progressive back pain history of fusion EXAM: LUMBAR SPINE - COMPLETE 4+ VIEW COMPARISON:  03/31/2020 FINDINGS: Posterior rods and fixating screws L2 through L5 with interbody devices at L2-L3, L3-L4 and L4-L5. Advanced degenerative change at L5-S1. Hardware appears grossly intact. IMPRESSION: Postsurgical changes L2 through L5.  No acute osseous abnormality Electronically Signed   By: Donavan Foil M.D.   On: 05/30/2020 16:25    Procedures Procedures   Medications Ordered in ED Medications  oxyCODONE-acetaminophen (PERCOCET/ROXICET) 5-325 MG per tablet 2 tablet (2 tablets Oral Given 05/30/20 1659)    ED Course  I have reviewed the triage vital signs and the nursing notes.  Pertinent labs & imaging results that were available during my care of the patient were reviewed by me and considered in my medical decision making (see chart for details).    MDM Rules/Calculators/A&P                         patient here for evaluation of acute on chronic right leg pain. She does have a history of prior lumbar surgery. No red flags on review of systems. She is neurologically intact on evaluation. Pain distribution is consistent with radicular pain. She has neurosurgery follow-up scheduled for later this week. Plan to discharge home with topical cream, ongoing home medications and return precautions. Patient declines steroid burst at this time.  Final Clinical Impression(s) / ED Diagnoses Final diagnoses:  Acute pain of right thigh    Rx / DC Orders ED Discharge Orders          Ordered    Diclofenac Sodium 3 % GEL  2 times daily PRN        05/30/20 1650           Quintella Reichert, MD 05/30/20 [phone]

## 2020-05-30 NOTE — ED Triage Notes (Signed)
Patient reports she had surgery in November to try and repair her sciatic nerve pain. Patient reports she has a rod and screws in her back. Patient reports she has been having increasing pain and tingling in her hip and right foot. Patient also reports sharp pains in her right foot.

## 2020-05-30 NOTE — ED Notes (Signed)
Pt requests to move around, as it is painful to lie still. Pt states she has hydrocodone for pain, which she takes at home, but has not had since last night, and is able to take every 6 hours. Pt alert & oriented, in obvious pain.

## 2020-06-02 ENCOUNTER — Other Ambulatory Visit: Payer: Self-pay | Admitting: Neurosurgery

## 2020-06-02 DIAGNOSIS — M4316 Spondylolisthesis, lumbar region: Secondary | ICD-10-CM

## 2020-06-07 ENCOUNTER — Other Ambulatory Visit: Payer: Self-pay

## 2020-06-07 ENCOUNTER — Ambulatory Visit: Payer: Medicare Other

## 2020-06-07 DIAGNOSIS — R0989 Other specified symptoms and signs involving the circulatory and respiratory systems: Secondary | ICD-10-CM

## 2020-06-15 ENCOUNTER — Other Ambulatory Visit: Payer: Self-pay | Admitting: Cardiology

## 2020-06-15 DIAGNOSIS — Z1231 Encounter for screening mammogram for malignant neoplasm of breast: Secondary | ICD-10-CM

## 2020-06-20 NOTE — Progress Notes (Signed)
Please inform patient there is left sided stenosis in carotid artery. Will discuss further at next office visit.

## 2020-06-20 NOTE — Progress Notes (Signed)
Called pt, pt is scheduling follow up with the front to go over results.

## 2020-06-23 ENCOUNTER — Ambulatory Visit
Admission: RE | Admit: 2020-06-23 | Discharge: 2020-06-23 | Disposition: A | Discharge status: Home / Self Care | Payer: Medicare Other | Source: Ambulatory Visit | Attending: Neurosurgery | Admitting: Neurosurgery

## 2020-06-23 DIAGNOSIS — M4316 Spondylolisthesis, lumbar region: Secondary | ICD-10-CM

## 2020-06-27 ENCOUNTER — Other Ambulatory Visit: Payer: Self-pay

## 2020-06-27 ENCOUNTER — Emergency Department (HOSPITAL_BASED_OUTPATIENT_CLINIC_OR_DEPARTMENT_OTHER): Payer: Medicare Other | Admitting: Radiology

## 2020-06-27 ENCOUNTER — Encounter (HOSPITAL_BASED_OUTPATIENT_CLINIC_OR_DEPARTMENT_OTHER): Payer: Self-pay | Admitting: Emergency Medicine

## 2020-06-27 ENCOUNTER — Emergency Department (HOSPITAL_BASED_OUTPATIENT_CLINIC_OR_DEPARTMENT_OTHER)
Admission: EM | Admit: 2020-06-27 | Discharge: 2020-06-27 | Disposition: A | Discharge status: Home / Self Care | Payer: Medicare Other | Attending: Emergency Medicine | Admitting: Emergency Medicine

## 2020-06-27 ENCOUNTER — Emergency Department (HOSPITAL_BASED_OUTPATIENT_CLINIC_OR_DEPARTMENT_OTHER): Payer: Medicare Other

## 2020-06-27 DIAGNOSIS — I10 Essential (primary) hypertension: Secondary | ICD-10-CM | POA: Diagnosis not present

## 2020-06-27 DIAGNOSIS — Z87891 Personal history of nicotine dependence: Secondary | ICD-10-CM | POA: Insufficient documentation

## 2020-06-27 DIAGNOSIS — R2243 Localized swelling, mass and lump, lower limb, bilateral: Secondary | ICD-10-CM | POA: Diagnosis present

## 2020-06-27 DIAGNOSIS — M7989 Other specified soft tissue disorders: Secondary | ICD-10-CM

## 2020-06-27 DIAGNOSIS — E039 Hypothyroidism, unspecified: Secondary | ICD-10-CM | POA: Diagnosis not present

## 2020-06-27 DIAGNOSIS — Z79899 Other long term (current) drug therapy: Secondary | ICD-10-CM | POA: Diagnosis not present

## 2020-06-27 DIAGNOSIS — R0602 Shortness of breath: Secondary | ICD-10-CM | POA: Insufficient documentation

## 2020-06-27 LAB — BASIC METABOLIC PANEL
Anion gap: 5 (ref 5–15)
BUN: 16 mg/dL (ref 8–23)
CO2: 28 mmol/L (ref 22–32)
Calcium: 9.7 mg/dL (ref 8.9–10.3)
Chloride: 109 mmol/L (ref 98–111)
Creatinine, Ser: 0.82 mg/dL (ref 0.44–1.00)
GFR, Estimated: >60 mL/min (ref >60)
Glucose, Bld: 106 mg/dL — ABNORMAL HIGH (ref 70–99)
Potassium: 4.1 mmol/L (ref 3.5–5.1)
Sodium: 142 mmol/L (ref 135–145)

## 2020-06-27 LAB — CBC WITH DIFFERENTIAL/PLATELET
Abs Immature Granulocytes: 0.01 10*3/uL (ref 0.00–0.07)
Basophils Absolute: 0 10*3/uL (ref 0.0–0.1)
Basophils Relative: 1 %
Eosinophils Absolute: 0.3 10*3/uL (ref 0.0–0.5)
Eosinophils Relative: 6 %
HCT: 36.2 % (ref 36.0–46.0)
Hemoglobin: 12 g/dL (ref 12.0–15.0)
Immature Granulocytes: 0 %
Lymphocytes Relative: 22 %
Lymphs Abs: 1 10*3/uL (ref 0.7–4.0)
MCH: 29.9 pg (ref 26.0–34.0)
MCHC: 33.1 g/dL (ref 30.0–36.0)
MCV: 90 fL (ref 80.0–100.0)
Monocytes Absolute: 0.5 10*3/uL (ref 0.1–1.0)
Monocytes Relative: 11 %
Neutro Abs: 2.7 10*3/uL (ref 1.7–7.7)
Neutrophils Relative %: 60 %
Platelets: 192 10*3/uL (ref 150–400)
RBC: 4.02 MIL/uL (ref 3.87–5.11)
RDW: 15.2 % (ref 11.5–15.5)
WBC: 4.4 10*3/uL (ref 4.0–10.5)
nRBC: 0 % (ref 0.0–0.2)

## 2020-06-27 LAB — TROPONIN I (HIGH SENSITIVITY): Troponin I (High Sensitivity): 2 ng/L (ref <18)

## 2020-06-27 LAB — BRAIN NATRIURETIC PEPTIDE: B Natriuretic Peptide: 118 pg/mL — ABNORMAL HIGH (ref 0.0–100.0)

## 2020-06-27 MED ORDER — FUROSEMIDE 20 MG PO TABS: 20.0000 mg | ORAL_TABLET | Freq: Every day | ORAL | 0 refills | Status: DC | PRN

## 2020-06-27 NOTE — Discharge Instructions (Addendum)
Please follow-up with your cardiologist.  Recommend taking trial of the fluid pill.  Discussed this medication with your cardiologist.  Ideally you should have repeat blood work and be considered for an echocardiogram.  If you have worsening leg swelling, any difficulty in breathing or chest pain, he should return immediately to ER for reassessment.  Recommend compression socks and elevating legs particularly at night.

## 2020-06-27 NOTE — ED Provider Notes (Signed)
[facility] EMERGENCY DEPT Provider Note   CSN: [id number] Arrival date & time: 06/27/20  1413     History Chief Complaint  Patient presents with  . Leg Swelling    Patricia Parks is a 75 y.o. female.  Presenting to ER with concern for bilateral leg swelling.  She reports that she has had intermittent swelling previously but it has always resolved.  Started Friday and has been persistent over the weekend.  No pain.  Does seem to be worse in the right leg.  No chest pain.  Has felt slightly more short of breath with exertion than normal.    HPI     Past Medical History:  Diagnosis Date  . Anxiety   . Depression   . Fibromyalgia   . GERD (gastroesophageal reflux disease)   . Gout 04/2017  . Hyperlipidemia   . Hypertension   . Hypothyroidism       . Low kidney function    followed by Dr Alyson Ingles  . Spinal stenosis     Patient Active Problem List   Diagnosis Date Noted  . Spondylolisthesis at L4-L5 level 01/13/2020  . Pain in left knee 05/28/2017  . Elevated lipids 12/30/2014  . History of cervical dysplasia 12/30/2014  . Essential hypertension 09/10/2013  . Chest pain 08/28/2013    Past Surgical History:  Procedure Laterality Date  . Carpel Tunnel Surgery Right 10/17/2018  . CATARACT EXTRACTION W/ INTRAOCULAR LENS IMPLANT Bilateral 10/25/2015   Right eye was first.  Left eye done 10/17.  . CERVICAL BIOPSY  W/ LOOP ELECTRODE EXCISION  12/2008   hx CIN II     OB History    Gravida  2   Para  2   Term  2   Preterm      AB      Living  2     SAB      IAB      Ectopic      Multiple      Live Births              Family History  Problem Relation Age of Onset  . Osteoporosis Mother   . Hypertension Mother   . Uterine cancer Mother   . Heart disease Mother        Atrial fibrillation  . Breast cancer Sister   . Osteoporosis Sister   . Diabetes Sister   . Diabetes Father   . Heart disease Sister     Social History    Tobacco Use  . Smoking status: Former Smoker    Packs/day: 0.25    Years: 5.00    Pack years: 1.25    Types: Cigarettes    Quit date: 1975    Years since quitting: 47.3  . Smokeless tobacco: Never Used  . Tobacco comment: in the 45s  Vaping Use  . Vaping Use: Never used  Substance Use Topics  . Alcohol use: Yes    Alcohol/week: 1.0 standard drink    Types: 1 Standard drinks or equivalent per week    Comment: Occasional  . Drug use: No    Home Medications Prior to Admission medications   Medication Sig Start Date End Date Taking? Authorizing Provider  furosemide (LASIX) 20 MG tablet Take 1 tablet (20 mg total) by mouth daily as needed for edema. 06/27/20  Yes Lucrezia Starch, MD  allopurinol (ZYLOPRIM) 100 MG tablet Take 100 mg by mouth daily.    [provider]  amLODipine (NORVASC) 2.5 MG tablet Take 2.5 mg by mouth daily.    [provider]  busPIRone (BUSPAR) 7.5 MG tablet Take 7.5 mg by mouth 2 (two) times daily. 01/07/19   [provider]  Diclofenac Sodium 3 % GEL Apply 2 g topically 2 (two) times daily as needed. 05/30/20   Quintella Reichert, MD  HYDROcodone-acetaminophen [facility]) 10-325 MG tablet Take 0.5 tablets by mouth every 6 (six) hours as needed for moderate pain.     [provider]  levothyroxine (SYNTHROID) 75 MCG tablet Take 75 mcg by mouth daily before breakfast.    [provider]  losartan (COZAAR) 100 MG tablet Take 1 tablet (100 mg total) by mouth daily. 05/25/20   Cantwell, Celeste C, PA-C  LYRICA 300 MG capsule Take 300 mg by mouth 2 (two) times daily.  01/14/19   [provider]  meloxicam (MOBIC) 15 MG tablet Take 1 tablet (15 mg total) by mouth daily. 07/27/19   Edrick Kins, DPM  metoprolol succinate (TOPROL-XL) 50 MG 24 hr tablet Take 50 mg by mouth daily. Take with or immediately following a meal.    [provider]  Omega-3 Fatty Acids (SALMON OIL) 200 MG CAPS Take 2 capsules by mouth  daily.    [provider]  rosuvastatin (CRESTOR) 10 MG tablet Take 1 tablet (10 mg total) by mouth daily. 05/25/20   Cantwell, Celeste C, PA-C    Allergies    Patient has no known allergies.  Review of Systems   Review of Systems  Constitutional: Negative for chills and fever.  HENT: Negative for ear pain and sore throat.   Eyes: Negative for pain and visual disturbance.  Respiratory: Positive for shortness of breath. Negative for cough.   Cardiovascular: Positive for leg swelling. Negative for chest pain and palpitations.  Gastrointestinal: Negative for abdominal pain and vomiting.  Genitourinary: Negative for dysuria and hematuria.  Musculoskeletal: Negative for arthralgias and back pain.  Skin: Negative for color change and rash.  Neurological: Negative for seizures and syncope.  All other systems reviewed and are negative.   Physical Exam Updated Vital Signs BP 136/61 (BP Location: Right Arm)   Pulse 73   Temp 97.9 F (36.6 C) (Oral)   Resp 16   Ht 5' (1.524 m)   Wt 108.9 kg   LMP 03/05/2000   SpO2 99%   BMI 46.87 kg/m   Physical Exam Vitals and nursing note reviewed.  Constitutional:      General: She is not in acute distress.    Appearance: She is well-developed.  HENT:     Head: Normocephalic and atraumatic.  Eyes:     Conjunctiva/sclera: Conjunctivae normal.  Cardiovascular:     Rate and Rhythm: Normal rate and regular rhythm.     Heart sounds: No murmur heard.   Pulmonary:     Effort: Pulmonary effort is normal. No respiratory distress.     Breath sounds: Normal breath sounds.  Abdominal:     Palpations: Abdomen is soft.     Tenderness: There is no abdominal tenderness.  Musculoskeletal:     Cervical back: Neck supple.     Comments: Legs appear grossly normal, very mild nonpitting edema noted in the right leg, normal pulses and sensation in both legs  Skin:    General: Skin is warm and dry.  Neurological:     General: No focal deficit  present.     Mental Status: She is alert.  Psychiatric:  Mood and Affect: Mood normal.     ED Results / Procedures / Treatments   Labs (all labs ordered are listed, but only abnormal results are displayed) Labs Reviewed  BASIC METABOLIC PANEL - Abnormal; Notable for the following components:      Result Value   Glucose, Bld 106 (*)    All other components within normal limits  BRAIN NATRIURETIC PEPTIDE - Abnormal; Notable for the following components:   B Natriuretic Peptide 118.0 (*)    All other components within normal limits  CBC WITH DIFFERENTIAL/PLATELET  TROPONIN I (HIGH SENSITIVITY)    EKG EKG Interpretation  Date/Time:  Monday June 27 2020 14:45:04 EDT Ventricular Rate:  66 PR Interval:  164 QRS Duration: 82 QT Interval:  394 QTC Calculation: 413 R Axis:   84 Text Interpretation: Normal sinus rhythm Low voltage QRS Cannot rule out Anterior infarct , age undetermined Abnormal ECG no acuteSTEMI Confirmed by Madalyn Rob [phone]) on 06/27/2020 5:04:35 PM   Radiology DG Chest 2 View  Result Date: 06/27/2020 CLINICAL DATA:  Shortness of breath and leg swelling. EXAM: CHEST - 2 VIEW COMPARISON:  Chest x-ray dated June 21, 2018. FINDINGS: The heart size and mediastinal contours are within normal limits. Both lungs are clear. The visualized skeletal structures are unremarkable. IMPRESSION: No active cardiopulmonary disease. Electronically Signed   By: Titus Dubin M.D.   On: 06/27/2020 15:17   US Venous Img Lower Right (DVT Study)  Result Date: 06/27/2020 CLINICAL DATA:  Leg swelling. EXAM: RIGHT LOWER EXTREMITY VENOUS DOPPLER ULTRASOUND TECHNIQUE: Gray-scale sonography with compression, as well as color and duplex ultrasound, were performed to evaluate the deep venous system(s) from the level of the common femoral vein through the popliteal and proximal calf veins. COMPARISON:  None. FINDINGS: VENOUS Normal compressibility of the common femoral, superficial  femoral, and popliteal veins, as well as the visualized calf veins. Limited visualization of the peroneal veins. Visualized portions of profunda femoral vein and great saphenous vein unremarkable. No filling defects to suggest DVT on grayscale or color Doppler imaging. Doppler waveforms show normal direction of venous flow, normal respiratory plasticity and response to augmentation. Limited views of the contralateral common femoral vein are unremarkable. Limitations: Limited visualization of the peroneal veins. IMPRESSION: No evidence of DVT. Electronically Signed   By: Margaretha Sheffield MD   On: 06/27/2020 17:51    Procedures Procedures   Medications Ordered in ED Medications - No data to display  ED Course  I have reviewed the triage vital signs and the nursing notes.  Pertinent labs & imaging results that were available during my care of the patient were reviewed by me and considered in my medical decision making (see chart for details).    MDM Rules/Calculators/A&P                         75 year old lady presents to ER with concern for swelling in both legs though worse on the right.  On physical exam, patient noted to be remarkably well-appearing in no distress.  Her EKG does not have acute ischemic change and troponin is within normal limits, doubt ACS.  CXR is negative.  DVT study negative.  BNP very mildly elevated.  Based on physical exam and chest x-ray and vital signs, do not clinically appear to be in any sort of severe heart failure exacerbation. States that she has seen a cardiologist previously.  Recommend a trial of low-dose Lasix prn, elevation, compression stockings and  close follow-up with her primary doctor and cardiologist.   After the discussed management above, the patient was determined to be safe for discharge.  The patient was in agreement with this plan and all questions regarding their care were answered.  ED return precautions were discussed and the patient will  return to the ED with any significant worsening of condition.   Final Clinical Impression(s) / ED Diagnoses Final diagnoses:  Leg swelling    Rx / DC Orders ED Discharge Orders         Ordered    furosemide (LASIX) 20 MG tablet  Daily PRN        06/27/20 1741           Milagros Loll, MD 06/28/20 1639

## 2020-06-27 NOTE — ED Triage Notes (Signed)
Bilateral leg swelling since Friday with SOB on exertion. Denies chest pain.

## 2020-06-27 NOTE — ED Notes (Signed)
Patient verbalizes understanding of discharge instructions. Opportunity for questioning and answers were provided. Armband removed by staff, pt discharged from ED.  

## 2020-06-27 NOTE — ED Provider Notes (Signed)
MSE was initiated and I personally evaluated the patient and placed orders (if any) at  3:56 PM on June 27, 2020.  The patient appears stable so that the remainder of the MSE may be completed by another provider.   Lucrezia Starch, MD 06/27/20 1556

## 2020-06-27 NOTE — ED Notes (Signed)
Patient called from the lobby and name placed in room in epic. Patient in imaging at this time. Will update vitals once back in room.

## 2020-06-28 ENCOUNTER — Other Ambulatory Visit (HOSPITAL_COMMUNITY): Payer: Self-pay | Admitting: Neurosurgery

## 2020-06-28 DIAGNOSIS — M4316 Spondylolisthesis, lumbar region: Secondary | ICD-10-CM

## 2020-06-30 ENCOUNTER — Ambulatory Visit: Payer: Medicare Other | Admitting: Student

## 2020-06-30 ENCOUNTER — Encounter: Payer: Self-pay | Admitting: Student

## 2020-06-30 ENCOUNTER — Other Ambulatory Visit: Payer: Self-pay

## 2020-06-30 VITALS — BP 149/86 | HR 60 | Temp 98.0°F | Ht 65.0 in | Wt 231.0 lb

## 2020-06-30 DIAGNOSIS — I10 Essential (primary) hypertension: Secondary | ICD-10-CM

## 2020-06-30 DIAGNOSIS — I6522 Occlusion and stenosis of left carotid artery: Secondary | ICD-10-CM

## 2020-06-30 DIAGNOSIS — R600 Localized edema: Secondary | ICD-10-CM

## 2020-06-30 MED ORDER — ASPIRIN EC 81 MG PO TBEC: 81.0000 mg | DELAYED_RELEASE_TABLET | Freq: Every day | ORAL | 3 refills | Status: DC

## 2020-06-30 MED ORDER — CHLORTHALIDONE 25 MG PO TABS: 12.5000 mg | ORAL_TABLET | Freq: Every day | ORAL | 3 refills | Status: DC

## 2020-06-30 NOTE — Patient Instructions (Addendum)
[facility] in 1 week for blood work - BMP  Lasix as needed Start Chlorthalidone.  Start aspirin 81 mg

## 2020-06-30 NOTE — Progress Notes (Signed)
Primary Physician/Referring:  Secundino Ginger, PA-C  [name], female    DOB: [birth year], 75 y.o.   MRN: [id number]  Chief Complaint  Patient presents with  . Leg Swelling  . Follow-up    [name]  is a 75 y.o. female  with fibromyalgia, anxiety, depression, hyperlipidemia, hypertension.   Patient presents for urgent visit with complaints of leg swelling.  She was evaluated at [facility] emergency department on 06/27/2020 at which time chest x-ray, DVT study, and EKG and troponins were negative.  BNP was mildly elevated, therefore patient was discharged with Lasix as needed.  He now presents for follow-up.  She reports around 06/25/2020 she noticed increased swelling in her ankles bilaterally.  Upon discharge from the emergency department she was given Lasix 20 mg daily, which she has taken for the last 2 days and noticed improvement of bilateral lower leg edema.  Patient continues to have significant back pain, she is awaiting MRI for further evaluation.  She is now ambulating with a cane due to back pain.  However she is able to complete physical therapy twice weekly.  She continues to walk laps around her backyard without issue other than worsening back pain.  Patient denies chest pain, palpitations, syncope, near syncope, dizziness.  Denies dyspnea, PND, orthopnea.  Past Medical History:  Diagnosis Date  . Anxiety   . Depression   . Fibromyalgia   . GERD (gastroesophageal reflux disease)   . Gout 04/2017  . Hyperlipidemia   . Hypertension   . Hypothyroidism       . Low kidney function    followed by Dr Alyson Ingles  . Spinal stenosis     Past Surgical History:  Procedure Laterality Date  . Carpel Tunnel Surgery Right 10/17/2018  . CATARACT EXTRACTION W/ INTRAOCULAR LENS IMPLANT Bilateral 10/25/2015   Right eye was first.  Left eye done 10/17.  . CERVICAL BIOPSY  W/ LOOP ELECTRODE EXCISION  12/2008   hx CIN II   Family History  Problem  Relation Age of Onset  . Osteoporosis Mother   . Hypertension Mother   . Uterine cancer Mother   . Heart disease Mother        Atrial fibrillation  . Breast cancer Sister   . Osteoporosis Sister   . Diabetes Sister   . Diabetes Father   . Heart disease Sister    Social History   Tobacco Use  . Smoking status: Former Smoker    Packs/day: 0.25    Years: 5.00    Pack years: 1.25    Types: Cigarettes    Quit date: 1975    Years since quitting: 47.3  . Smokeless tobacco: Never Used  . Tobacco comment: in the 70s  Substance Use Topics  . Alcohol use: Yes    Alcohol/week: 1.0 standard drink    Types: 1 Standard drinks or equivalent per week    Comment: Occasional   Marital Status: Married  Current Outpatient Medications on File Prior to Visit  Medication Sig Dispense Refill  . allopurinol (ZYLOPRIM) 100 MG tablet Take 100 mg by mouth daily.    Marland Kitchen amLODipine (NORVASC) 2.5 MG tablet Take 2.5 mg by mouth daily.    . busPIRone (BUSPAR) 7.5 MG tablet Take 7.5 mg by mouth 2 (two) times daily.    . furosemide (LASIX) 20 MG tablet Take 1 tablet (20 mg total) by mouth daily as needed for edema. 30 tablet 0  . HYDROcodone-acetaminophen ([place])  10-325 MG tablet Take 0.5 tablets by mouth every 6 (six) hours as needed for moderate pain.     Marland Kitchen levothyroxine (SYNTHROID) 75 MCG tablet Take 75 mcg by mouth daily before breakfast.    . losartan (COZAAR) 100 MG tablet Take 1 tablet (100 mg total) by mouth daily. 90 tablet 3  . LYRICA 300 MG capsule Take 300 mg by mouth 2 (two) times daily.     . metoprolol succinate (TOPROL-XL) 50 MG 24 hr tablet Take 50 mg by mouth daily. Take with or immediately following a meal.    . Omega-3 Fatty Acids (SALMON OIL) 200 MG CAPS Take 2 capsules by mouth daily.    . rosuvastatin (CRESTOR) 10 MG tablet Take 1 tablet (10 mg total) by mouth daily. 90 tablet 3   No current facility-administered medications on file prior to visit.   Review of Systems   Constitutional: Negative for chills, decreased appetite, malaise/fatigue and weight gain.  Cardiovascular: Positive for leg swelling. Negative for chest pain, claudication, dyspnea on exertion, near-syncope, orthopnea, palpitations, paroxysmal nocturnal dyspnea and syncope.  Respiratory: Negative for shortness of breath, sputum production and wheezing.   Endocrine: Negative for cold intolerance.  Hematologic/Lymphatic: Does not bruise/bleed easily.  Musculoskeletal: Positive for back pain (severe spinal stenosis, s/p surgica intervention) and joint pain. Negative for joint swelling.  Gastrointestinal: Negative for abdominal pain, anorexia, change in bowel habit, hematochezia and melena.  Neurological: Negative for dizziness, headaches, light-headedness and weakness.  Psychiatric/Behavioral: Negative for depression and substance abuse.  All other systems reviewed and are negative.     Objective  Blood pressure (!) 149/86, pulse 60, temperature 98 F (36.7 C), height 5\' 5"  (1.651 m), weight 231 lb (104.8 kg), last menstrual period 03/05/2000, SpO2 97 %. Body mass index is 38.44 kg/m.  Physical Exam Vitals reviewed.  Constitutional:      General: She is not in acute distress.    Appearance: She is well-developed.     Comments: Moderately obese  Neck:     Thyroid: No thyromegaly.     Comments: Short neck and difficult to evaluate JVP Cardiovascular:     Rate and Rhythm: Normal rate and regular rhythm.     Pulses: Normal pulses and intact distal pulses.          Carotid pulses are 2+ on the right side with bruit and 2+ on the left side.      Radial pulses are 2+ on the right side and 2+ on the left side.       Popliteal pulses are 2+ on the right side and 2+ on the left side.       Dorsalis pedis pulses are 2+ on the right side and 2+ on the left side.       Posterior tibial pulses are 2+ on the right side and 2+ on the left side.     Heart sounds: Normal heart sounds, S1 normal and S2  normal. No murmur heard. No gallop.   Pulmonary:     Effort: Pulmonary effort is normal. No respiratory distress.     Breath sounds: Normal breath sounds. No wheezing, rhonchi or rales.  Abdominal:     Comments: Obese. Pannus present  Musculoskeletal:        General: Normal range of motion.     Cervical back: Neck supple.     Right lower leg: No edema.     Left lower leg: No edema.  Skin:    General: Skin is warm  and dry.  Neurological:     Mental Status: She is alert.    Laboratory examination:   External labs:  10/07/2019: Glucose 101, sodium 140, potassium 4.2, BUN 24, creatinine 0.99, GFR 57 Hemoglobin 12.1, hematocrit 34.8, platelet 178, MCV 89.7 A1c 5.7% TSH 0.746  06/09/2018: Potassium 4.3, serum glucose 122 mg, BUN 18, creatinine 1.2, AST 36, AST 54 both elevated.  Total cholesterol 222, triglycerides 215, HDL 54, LDL 127.  Uric acid 8.4, elevated.  ANA negative.  Rheumatoid factor negative.  CMP Latest Ref Rng & Units 06/27/2020 01/13/2020 01/13/2020  Glucose 70 - 99 mg/dL 106(H) 214(H) 155(H)  BUN 8 - 23 mg/dL 16 30(H) 27(H)  Creatinine 0.44 - 1.00 mg/dL 0.82 1.45(H) 1.59(H)  Sodium 135 - 145 mmol/L 142 134(L) 130(L)  Potassium 3.5 - 5.1 mmol/L 4.1 3.6 3.2(L)  Chloride 98 - 111 mmol/L 109 101 95(L)  CO2 22 - 32 mmol/L 28 24 24   Calcium 8.9 - 10.3 mg/dL 9.7 8.0(L) 8.9  Total Protein 6.0 - 8.5 g/dL - - -  Total Bilirubin 0.0 - 1.2 mg/dL - - -  Alkaline Phos 39 - 117 IU/L - - -  AST 0 - 40 IU/L - - -  ALT 0 - 32 IU/L - - -   CBC Latest Ref Rng & Units 06/27/2020 01/16/2020 01/15/2020  WBC 4.0 - 10.5 K/uL 4.4 5.9 5.7  Hemoglobin 12.0 - 15.0 g/dL 12.0 9.7(L) 8.9(L)  Hematocrit 36.0 - 46.0 % 36.2 28.9(L) 26.5(L)  Platelets 150 - 400 K/uL 192 165 123(L)   Lipid Panel     Component Value Date/Time   CHOL 121 10/22/2018 0815   TRIG 104 10/22/2018 0815   HDL 45 10/22/2018 0815   CHOLHDL 6.1 08/29/2013 0102   VLDL UNABLE TO CALCULATE IF TRIGLYCERIDE OVER 400 mg/dL  08/29/2013 0102   LDLCALC 55 10/22/2018 0815   LDLDIRECT 58 10/22/2018 0815   HEMOGLOBIN A1C No results found for: HGBA1C, MPG TSH No results for input(s): TSH in the last 8760 hours.  BNP 06/27/2020: 118  Radiology :  No results found.  Chest x-ray 06/27/2020: The heart size and mediastinal contours are within normal limits. Both lungs are clear. The visualized skeletal structures are unremarkable.  IMPRESSION:No active cardiopulmonary disease  Cardiac Studies:  Stress Echo 09/25/2013: Patient exercised on Bruce protocol for 7:50 minutes and achieved 10.8 METS.  Achieved 107% of MPHR. - Stress echocardiogram with no chest pain nondiagnostic ST changes; no stress-induced wall motion abnormalities; interpreted as normal stress echocardiogram.  Lower extremity venous duplex 05/29/2018: No evidence of deep venous thrombosis. Left leg  Echocardiogram [facility] 06/17/2018: Normal LV systolic function, EF 09-62%.  Mild LVH.  No significant valvular abnormality.  [place] Stress Test 08/06/2018: Nondiagnostic ECG stress due to pharmacologic stress protocol. Normal myocardial perfusion.  Stress LV EF: 72%.  Low risk study.  Carotid artery duplex 06/07/2020:  No evidence of significant stenosis in the right carotid vessels.  Doppler velocity suggests stenosis in the left internal carotid artery  (16-49%). Left carotid artery demonstrates tortuosity. No significant  plaque burden noted and hence bruit or elevated velocity could be due to  tortuosity.  Antegrade right vertebral artery flow. Antegrade left vertebral artery  flow.  Follow up in one year is appropriate for follow up.  Venous ultrasound right leg 06/27/2020: Normal compressibility of the common femoral, superficial femoral, and popliteal veins, as well as the visualized calf veins. Limited visualization of the peroneal veins. Visualized portions of profunda  femoral vein and great saphenous vein  unremarkable. No filling defects to suggest DVT on grayscale or color Doppler imaging. Doppler waveforms show normal direction of venous flow, normal respiratory plasticity and response to augmentation.  Limited views of the contralateral common femoral vein are unremarkable.  Limitations: Limited visualization of the peroneal veins.  IMPRESSION: No evidence of DVT.  EKG   EKG 05/25/2020: Sinus rhythm at a rate of 75 bpm with single PAC.  Left atrial enlargement.  Left axis, left anterior fascicular block.  Incomplete right bundle branch block.  Nonspecific T wave abnormality.  Compared to EKG 07/17/2018, no significant change.  EKG 07/17/2018: Normal sinus rhythm at rate of 75 bpm, left atrial enlargement, normal axis.  Incomplete right bundle branch block.  No evidence of ischemia.  Assessment     ICD-10-CM   1. Essential hypertension  I10 PCV ECHOCARDIOGRAM COMPLETE    Basic metabolic panel  2. Stenosis of left carotid artery  I65.22 PCV CAROTID DUPLEX (BILATERAL)  3. Bilateral leg edema  R60.0 PCV ECHOCARDIOGRAM COMPLETE      Meds ordered this encounter  Medications  . chlorthalidone (HYGROTON) 25 MG tablet    Sig: Take 0.5 tablets (12.5 mg total) by mouth daily.    Dispense:  15 tablet    Refill:  3  . aspirin EC 81 MG tablet    Sig: Take 1 tablet (81 mg total) by mouth daily. Swallow whole.    Dispense:  90 tablet    Refill:  3   Medications Discontinued During This Encounter  Medication Reason  . Diclofenac Sodium 3 % GEL Error  . meloxicam (MOBIC) 15 MG tablet Error    Recommendations:   Ms. Patricia Parks is a pleasant 75 y.o.Caucasian femalewith fibromyalgia, anxiety, depression, hyperlipidemia, hypertension  Patient presents for urgent visit with complaints of leg swelling.  She was evaluated at [place] emergency department on 06/27/2020 at which time chest x-ray, DVT study, and EKG and troponins were negative.  BNP was mildly elevated, therefore patient  was discharged with Lasix as needed.  He now presents for follow-up.  Leg swelling has improved with addition of Lasix 20 mg daily over the last 2 days.  View of worsening bilateral lower leg edema will obtain echocardiogram at this time.  Patient's blood pressure is also elevated in the office today, will therefore resume chlorthalidone 25 mg daily which will likely improve blood pressure control as well as leg edema.  Advised patient to take Lasix 20 mg daily as needed for volume overload, she verbalized understanding agreement.  We will repeat BMP in 1 week.  Reviewed and discussed with patient regarding results of carotid artery duplex, revealing mild left carotid artery stenosis.  We will repeat carotid artery surveillance in 1 year.  Patient is presently on statin, will continue this.  I also advised her to resume aspirin 81 mg daily.  Follow-up in 8 weeks, sooner if needed, for bilateral leg edema, hypertension, and echocardiogram results.   Alethia Berthold, PA-C 06/30/2020, 3:40 PM Office: [phone]

## 2020-07-05 ENCOUNTER — Other Ambulatory Visit: Payer: Medicare Other

## 2020-07-08 LAB — BASIC METABOLIC PANEL
BUN/Creatinine Ratio: 15 (ref 12–28)
BUN: 15 mg/dL (ref 8–27)
CO2: 23 mmol/L (ref 20–29)
Calcium: 9.6 mg/dL (ref 8.7–10.3)
Chloride: 102 mmol/L (ref 96–106)
Creatinine, Ser: 0.99 mg/dL (ref 0.57–1.00)
Glucose: 91 mg/dL (ref 65–99)
Potassium: 4.6 mmol/L (ref 3.5–5.2)
Sodium: 141 mmol/L (ref 134–144)
eGFR: 60 mL/min/{1.73_m2} (ref >59)

## 2020-07-08 NOTE — Progress Notes (Signed)
Called to inform her about her lab results. Pt understood.

## 2020-07-08 NOTE — Progress Notes (Signed)
Please inform pt renal function is stable. Continue current medications.

## 2020-07-11 ENCOUNTER — Ambulatory Visit: Payer: Medicare Other | Admitting: Student

## 2020-07-11 ENCOUNTER — Other Ambulatory Visit: Payer: Self-pay

## 2020-07-11 ENCOUNTER — Ambulatory Visit: Payer: Medicare Other

## 2020-07-11 DIAGNOSIS — I10 Essential (primary) hypertension: Secondary | ICD-10-CM

## 2020-07-11 DIAGNOSIS — R600 Localized edema: Secondary | ICD-10-CM

## 2020-07-12 NOTE — Progress Notes (Signed)
Please inform patient echocardiogram showed normal pumping activity of the heart.  We will discuss further at next office visit.

## 2020-07-12 NOTE — Progress Notes (Signed)
Called and spoke with patient regarding her echocardiogram results.

## 2020-07-21 ENCOUNTER — Encounter (HOSPITAL_COMMUNITY): Payer: Self-pay | Admitting: *Deleted

## 2020-07-21 ENCOUNTER — Other Ambulatory Visit: Payer: Self-pay

## 2020-07-21 NOTE — Progress Notes (Signed)
Spoke with pt for pre-op call. Pt has hx of HTN, but denies cardiac disease or Diabetes.   No Covid test needed because pt is scheduled as an ambulatory procedure.  Chart sent to Anesthesia PA to review recent Echo.

## 2020-07-22 ENCOUNTER — Ambulatory Visit (HOSPITAL_COMMUNITY): Payer: Medicare Other

## 2020-07-22 NOTE — Anesthesia Preprocedure Evaluation (Addendum)
Anesthesia Evaluation  Patient identified by MRN, date of birth, ID band Patient awake    Reviewed: Allergy & Precautions, NPO status , Patient's Chart, lab work & pertinent test results, reviewed documented beta blocker date and time   History of Anesthesia Complications Negative for: history of anesthetic complications  Airway Mallampati: III  TM Distance: >3 FB Neck ROM: Full    Dental no notable dental hx.    Pulmonary former smoker,    Pulmonary exam normal        Cardiovascular hypertension, Pt. on medications and Pt. on home beta blockers Normal cardiovascular exam     Neuro/Psych Anxiety Depression negative neurological ROS     GI/Hepatic negative GI ROS, Neg liver ROS,   Endo/Other  Hypothyroidism   Renal/GU negative Renal ROS  negative genitourinary   Musculoskeletal  (+) Fibromyalgia -  Abdominal   Peds  Hematology negative hematology ROS (+)   Anesthesia Other Findings Day of surgery medications reviewed with patient.  Reproductive/Obstetrics negative OB ROS                            Anesthesia Physical Anesthesia Plan  ASA: III  Anesthesia Plan: General   Post-op Pain Management:    Induction: Intravenous  PONV Risk Score and Plan: Treatment may vary due to age or medical condition, Ondansetron, Dexamethasone and Midazolam  Airway Management Planned: Oral ETT  Additional Equipment:   Intra-op Plan:   Post-operative Plan: Extubation in OR  Informed Consent: I have reviewed the patients History and Physical, chart, labs and discussed the procedure including the risks, benefits and alternatives for the proposed anesthesia with the patient or authorized representative who has indicated his/her understanding and acceptance.     Dental advisory given  Plan Discussed with: CRNA  Anesthesia Plan Comments: (PAT note by Karoline Caldwell, PA-C: Patient follows with  cardiology for history of hyperlipidemia and hypertension.  History of Lexiscan stress test June 2020 that was low risk, nonischemic.  She was recently seen at the ED on 06/27/2020 for complaints of leg swelling.  Per ED provider note, "On physical exam, patient noted to be remarkably well-appearing in no distress.  Her EKG does not have acute ischemic change and troponin is within normal limits, doubt ACS.  CXR is negative.  DVT study negative.  BNP very mildly elevated.  Based on physical exam and chest x-ray and vital signs, do not clinically appear to be in any sort of severe heart failure exacerbation. States that she has seen a cardiologist previously.  Recommend a trial of low-dose Lasix prn, elevation, compression stockings and close follow-up with her primary doctor and cardiologist."  Patient did subsequently follow-up with cardiology on 06/30/2020.  Patient reported improvement in lower extremity edema with Lasix.  Blood pressure was mildly elevated and chlorthalidone was added to regimen for blood pressure control and management of edema.  She was recommended to continue Lasix 20 mg daily as needed for volume overload.  Echocardiogram was also ordered which was done on 07/11/2020 and showed mild concentric LVH, normal wall motion, normal LV systolic function with EF 59%, grade 1 DD, mild pulmonic regurgitation.  She will need day of surgery labs and evaluation.  EKG 06/30/2020: NSR.  Rate 66.  Low-voltage QRS.  Cannot rule out anterior infarct, age undetermined.  Echocardiogram 07/11/2020:  Left ventricle cavity is normal in size. Mild concentric hypertrophy of  the left ventricle. Normal global wall motion. Normal LV  systolic function  with EF 59%. Doppler evidence of grade I (impaired) diastolic dysfunction,  normal LAP.  Mild pulmonic regurgitation.  No evidence of pulmonary hypertension.  Carotid artery duplex 06/07/2020:  No evidence of significant stenosis in the right carotid vessels.   Doppler velocity suggests stenosis in the left internal carotid artery  (16-49%). Left carotid artery demonstrates tortuosity. No significant  plaque burden noted and hence bruit or elevated velocity could be due to  tortuosity.  Antegrade right vertebral artery flow. Antegrade left vertebral artery  flow.  Follow up in one year is appropriate for follow up.  [place] Stress Test 08/06/2018: Nondiagnostic ECG stress due to pharmacologic stress protocol. Normal myocardial perfusion.  Stress LV EF: 72%.  Low risk study. )       Anesthesia Quick Evaluation

## 2020-07-22 NOTE — Progress Notes (Signed)
Anesthesia Chart Review: Same day workup  Patient follows with cardiology for history of hyperlipidemia and hypertension.  History of Lexiscan stress test June 2020 that was low risk, nonischemic.  She was recently seen at the ED on 06/27/2020 for complaints of leg swelling.  Per ED provider note, "On physical exam, patient noted to be remarkably well-appearing in no distress.  Her EKG does not have acute ischemic change and troponin is within normal limits, doubt ACS.  CXR is negative.  DVT study negative.  BNP very mildly elevated.  Based on physical exam and chest x-ray and vital signs, do not clinically appear to be in any sort of severe heart failure exacerbation. States that she has seen a cardiologist previously.  Recommend a trial of low-dose Lasix prn, elevation, compression stockings and close follow-up with her primary doctor and cardiologist."  Patient did subsequently follow-up with cardiology on 06/30/2020.  Patient reported improvement in lower extremity edema with Lasix.  Blood pressure was mildly elevated and chlorthalidone was added to regimen for blood pressure control and management of edema.  She was recommended to continue Lasix 20 mg daily as needed for volume overload.  Echocardiogram was also ordered which was done on 07/11/2020 and showed mild concentric LVH, normal wall motion, normal LV systolic function with EF 59%, grade 1 DD, mild pulmonic regurgitation.  She will need day of surgery labs and evaluation.  EKG 06/30/2020: NSR.  Rate 66.  Low-voltage QRS.  Cannot rule out anterior infarct, age undetermined.  Echocardiogram 07/11/2020:  Left ventricle cavity is normal in size. Mild concentric hypertrophy of  the left ventricle. Normal global wall motion. Normal LV systolic function  with EF 59%. Doppler evidence of grade I (impaired) diastolic dysfunction,  normal LAP.  Mild pulmonic regurgitation.  No evidence of pulmonary hypertension.  Carotid artery duplex 06/07/2020:  No  evidence of significant stenosis in the right carotid vessels.  Doppler velocity suggests stenosis in the left internal carotid artery  (16-49%). Left carotid artery demonstrates tortuosity. No significant  plaque burden noted and hence bruit or elevated velocity could be due to  tortuosity.  Antegrade right vertebral artery flow. Antegrade left vertebral artery  flow.  Follow up in one year is appropriate for follow up.  [place] Stress Test 08/06/2018: Nondiagnostic ECG stress due to pharmacologic stress protocol. Normal myocardial perfusion.  Stress LV EF: 72%.  Low risk study.   Wynonia Musty [facility] Short Stay Center/Anesthesiology Phone [phone] 07/22/2020 12:59 PM

## 2020-07-26 ENCOUNTER — Ambulatory Visit (HOSPITAL_COMMUNITY): Payer: Medicare Other | Admitting: Physician Assistant

## 2020-07-26 ENCOUNTER — Other Ambulatory Visit: Payer: Self-pay

## 2020-07-26 ENCOUNTER — Encounter (HOSPITAL_COMMUNITY): Admission: RE | Disposition: A | Discharge status: Home / Self Care | Payer: Self-pay | Source: Home / Self Care

## 2020-07-26 ENCOUNTER — Encounter (HOSPITAL_COMMUNITY): Payer: Self-pay

## 2020-07-26 ENCOUNTER — Ambulatory Visit (HOSPITAL_COMMUNITY)
Admission: RE | Admit: 2020-07-26 | Discharge: 2020-07-26 | Disposition: A | Discharge status: Home / Self Care | Payer: Medicare Other | Attending: Neurosurgery | Admitting: Neurosurgery

## 2020-07-26 ENCOUNTER — Ambulatory Visit (HOSPITAL_COMMUNITY)
Admission: RE | Admit: 2020-07-26 | Discharge: 2020-07-26 | Disposition: A | Discharge status: Home / Self Care | Payer: Medicare Other | Source: Ambulatory Visit | Attending: Neurosurgery | Admitting: Neurosurgery

## 2020-07-26 DIAGNOSIS — Z981 Arthrodesis status: Secondary | ICD-10-CM | POA: Diagnosis not present

## 2020-07-26 DIAGNOSIS — M48061 Spinal stenosis, lumbar region without neurogenic claudication: Secondary | ICD-10-CM | POA: Insufficient documentation

## 2020-07-26 DIAGNOSIS — M5136 Other intervertebral disc degeneration, lumbar region: Secondary | ICD-10-CM | POA: Insufficient documentation

## 2020-07-26 DIAGNOSIS — M4316 Spondylolisthesis, lumbar region: Secondary | ICD-10-CM

## 2020-07-26 DIAGNOSIS — M79604 Pain in right leg: Secondary | ICD-10-CM | POA: Insufficient documentation

## 2020-07-26 DIAGNOSIS — G8929 Other chronic pain: Secondary | ICD-10-CM | POA: Diagnosis not present

## 2020-07-26 HISTORY — PX: RADIOLOGY WITH ANESTHESIA: SHX6223

## 2020-07-26 HISTORY — DX: Fatty (change of) liver, not elsewhere classified: K76.0

## 2020-07-26 LAB — BASIC METABOLIC PANEL
Anion gap: 9 (ref 5–15)
BUN: 17 mg/dL (ref 8–23)
CO2: 24 mmol/L (ref 22–32)
Calcium: 9.4 mg/dL (ref 8.9–10.3)
Chloride: 105 mmol/L (ref 98–111)
Creatinine, Ser: 1.01 mg/dL — ABNORMAL HIGH (ref 0.44–1.00)
GFR, Estimated: 59 mL/min — ABNORMAL LOW (ref >60)
Glucose, Bld: 107 mg/dL — ABNORMAL HIGH (ref 70–99)
Potassium: 3.7 mmol/L (ref 3.5–5.1)
Sodium: 138 mmol/L (ref 135–145)

## 2020-07-26 SURGERY — MRI WITH ANESTHESIA
Anesthesia: General

## 2020-07-26 MED ORDER — PROMETHAZINE HCL 25 MG/ML IJ SOLN: 6.2500 mg | INTRAMUSCULAR | Status: DC | PRN

## 2020-07-26 MED ORDER — OXYCODONE HCL 5 MG PO TABS: 5.0000 mg | ORAL_TABLET | Freq: Once | ORAL | Status: DC | PRN

## 2020-07-26 MED ORDER — PHENYLEPHRINE HCL-NACL 10-0.9 MG/250ML-% IV SOLN
INTRAVENOUS | Status: DC | PRN
  Administered 2020-07-26: 40 ug/min via INTRAVENOUS

## 2020-07-26 MED ORDER — PHENYLEPHRINE 40 MCG/ML (10ML) SYRINGE FOR IV PUSH (FOR BLOOD PRESSURE SUPPORT)
PREFILLED_SYRINGE | INTRAVENOUS | Status: DC | PRN
  Administered 2020-07-26: 160 ug via INTRAVENOUS

## 2020-07-26 MED ORDER — SUGAMMADEX SODIUM 200 MG/2ML IV SOLN
INTRAVENOUS | Status: DC | PRN
  Administered 2020-07-26: 200 mg via INTRAVENOUS

## 2020-07-26 MED ORDER — FENTANYL CITRATE (PF) 100 MCG/2ML IJ SOLN
INTRAMUSCULAR | Status: DC | PRN
  Administered 2020-07-26: 100 ug via INTRAVENOUS

## 2020-07-26 MED ORDER — GADOBUTROL 1 MMOL/ML IV SOLN
8.0000 mL | Freq: Once | INTRAVENOUS | Status: AC | PRN
  Administered 2020-07-26: 8 mL via INTRAVENOUS

## 2020-07-26 MED ORDER — LIDOCAINE 2% (20 MG/ML) 5 ML SYRINGE
INTRAMUSCULAR | Status: DC | PRN
  Administered 2020-07-26: 100 mg via INTRAVENOUS

## 2020-07-26 MED ORDER — ONDANSETRON HCL 4 MG/2ML IJ SOLN
INTRAMUSCULAR | Status: DC | PRN
  Administered 2020-07-26: 4 mg via INTRAVENOUS

## 2020-07-26 MED ORDER — DEXAMETHASONE SODIUM PHOSPHATE 10 MG/ML IJ SOLN
INTRAMUSCULAR | Status: DC | PRN
  Administered 2020-07-26: 5 mg via INTRAVENOUS

## 2020-07-26 MED ORDER — CHLORHEXIDINE GLUCONATE 0.12 % MT SOLN
OROMUCOSAL | Status: AC
  Administered 2020-07-26: 15 mL via OROMUCOSAL
  Filled 2020-07-26: qty 15

## 2020-07-26 MED ORDER — ROCURONIUM BROMIDE 10 MG/ML (PF) SYRINGE
PREFILLED_SYRINGE | INTRAVENOUS | Status: DC | PRN
  Administered 2020-07-26: 60 mg via INTRAVENOUS

## 2020-07-26 MED ORDER — LACTATED RINGERS IV SOLN: INTRAVENOUS | Status: DC

## 2020-07-26 MED ORDER — OXYCODONE HCL 5 MG/5ML PO SOLN: 5.0000 mg | Freq: Once | ORAL | Status: DC | PRN

## 2020-07-26 MED ORDER — PROPOFOL 10 MG/ML IV BOLUS
INTRAVENOUS | Status: DC | PRN
  Administered 2020-07-26: 20 mg via INTRAVENOUS
  Administered 2020-07-26: 180 mg via INTRAVENOUS

## 2020-07-26 MED ORDER — CHLORHEXIDINE GLUCONATE 0.12 % MT SOLN: 15.0000 mL | Freq: Once | OROMUCOSAL | Status: AC

## 2020-07-26 MED ORDER — ORAL CARE MOUTH RINSE: 15.0000 mL | Freq: Once | OROMUCOSAL | Status: AC

## 2020-07-26 MED ORDER — FENTANYL CITRATE (PF) 100 MCG/2ML IJ SOLN: 25.0000 ug | INTRAMUSCULAR | Status: DC | PRN

## 2020-07-26 NOTE — Anesthesia Procedure Notes (Signed)
Procedure Name: Intubation Date/Time: 07/26/2020 10:30 AM Performed by: Leonor Liv, CRNA Pre-anesthesia Checklist: Patient identified, Emergency Drugs available, Suction available and Patient being monitored Patient Re-evaluated:Patient Re-evaluated prior to induction Oxygen Delivery Method: Circle System Utilized Preoxygenation: Pre-oxygenation with 100% oxygen Induction Type: IV induction Ventilation: Mask ventilation without difficulty Laryngoscope Size: Glidescope and 3 Grade View: Grade I Tube type: Oral Tube size: 7.0 mm Number of attempts: 1 Airway Equipment and Method: Oral airway,  Video-laryngoscopy and Rigid stylet Placement Confirmation: ETT inserted through vocal cords under direct vision,  positive ETCO2 and breath sounds checked- equal and bilateral Secured at: 21 cm Tube secured with: Tape Dental Injury: Teeth and Oropharynx as per pre-operative assessment  Comments: Intubation performed by Newborn

## 2020-07-26 NOTE — Progress Notes (Signed)
Called Dr. Barron Alvine and let her know that on pain assessment patient reports upper chest tightness 3/10. Patient states this occurs when she gets anxious. Patient states she is anxious about MRI. Dr. Daiva Huge made aware. No further orders received at this time.

## 2020-07-26 NOTE — Transfer of Care (Signed)
Immediate Anesthesia Transfer of Care Note  Patient: Patricia Parks  Procedure(s) Performed: MRI WITH ANESTHESIA  LUMBAR WITH AND WITHOUT CONTRAST (N/A )  Patient Location: PACU  Anesthesia Type:General  Level of Consciousness: awake, alert  and oriented  Airway & Oxygen Therapy: Patient Spontanous Breathing and Patient connected to nasal cannula oxygen  Post-op Assessment: Report given to RN and Post -op Vital signs reviewed and stable  Post vital signs: Reviewed and stable  Last Vitals:  Vitals Value Taken Time  BP 123/84 07/26/20 1139  Temp    Pulse 74 07/26/20 1139  Resp 15 07/26/20 1139  SpO2 99 % 07/26/20 1139  Vitals shown include unvalidated device data.  Last Pain:  Vitals:   07/26/20 0818  TempSrc: Oral  PainSc:       Patients Stated Pain Goal: 0 (03/40/35 2481)  Complications: No complications documented.

## 2020-07-27 ENCOUNTER — Encounter (HOSPITAL_COMMUNITY): Payer: Self-pay | Admitting: Radiology

## 2020-07-27 NOTE — Anesthesia Postprocedure Evaluation (Signed)
Anesthesia Post Note  Patient: Myra Weng  Procedure(s) Performed: MRI WITH ANESTHESIA  LUMBAR WITH AND WITHOUT CONTRAST (N/A )     Patient location during evaluation: PACU Anesthesia Type: General Level of consciousness: awake and alert and oriented Pain management: pain level controlled Vital Signs Assessment: post-procedure vital signs reviewed and stable Respiratory status: spontaneous breathing, nonlabored ventilation and respiratory function stable Cardiovascular status: blood pressure returned to baseline Postop Assessment: no apparent nausea or vomiting Anesthetic complications: no   No complications documented.  Last Vitals:  Vitals:   07/26/20 1209 07/26/20 1218  BP: 106/70 114/68  Pulse: 73 72  Resp: 15 16  Temp:  36.6 C  SpO2: 93% 95%    Last Pain:  Vitals:   07/26/20 1139  TempSrc:   PainSc: 0-No pain                 Brennan Bailey

## 2020-08-05 ENCOUNTER — Ambulatory Visit
Admission: RE | Admit: 2020-08-05 | Discharge: 2020-08-05 | Disposition: A | Discharge status: Home / Self Care | Payer: Medicare Other | Source: Ambulatory Visit | Attending: Cardiology | Admitting: Cardiology

## 2020-08-05 ENCOUNTER — Other Ambulatory Visit: Payer: Self-pay

## 2020-08-05 DIAGNOSIS — Z1231 Encounter for screening mammogram for malignant neoplasm of breast: Secondary | ICD-10-CM

## 2020-08-25 ENCOUNTER — Other Ambulatory Visit: Payer: Self-pay

## 2020-08-25 ENCOUNTER — Encounter: Payer: Self-pay | Admitting: Student

## 2020-08-25 ENCOUNTER — Ambulatory Visit: Payer: Medicare Other | Admitting: Student

## 2020-08-25 VITALS — BP 113/70 | HR 70 | Temp 97.8°F | Resp 17 | Ht 65.0 in | Wt 230.0 lb

## 2020-08-25 DIAGNOSIS — E782 Mixed hyperlipidemia: Secondary | ICD-10-CM

## 2020-08-25 DIAGNOSIS — I10 Essential (primary) hypertension: Secondary | ICD-10-CM

## 2020-08-25 DIAGNOSIS — I6522 Occlusion and stenosis of left carotid artery: Secondary | ICD-10-CM

## 2020-08-25 NOTE — Progress Notes (Signed)
Primary Physician/Referring:  Secundino Ginger, PA-C  [name], female    DOB: 04-15-1945, 75 y.o.   MRN: [id number]  Chief Complaint  Patient presents with   Hypertension   Leg Swelling   Follow-up    8 week   Results    echo    [name]  is a 75 y.o. female  with fibromyalgia, anxiety, depression, hyperlipidemia, hypertension.   Patient presents for 8-week follow-up of bilateral leg edema, hypertension, and results of echocardiogram.  Resume chlorthalidone 25 mg daily, repeat BMP was stable.  Patient states she has been doing well since last visit.  She has had no recurrence of bilateral lower leg edema and blood pressure is now well controlled.  Patient denies chest pain, palpitations, syncope, near syncope, dizziness.  Denies dyspnea, PND, orthopnea.  Past Medical History:  Diagnosis Date   Anxiety    Depression    Fatty liver    Fibromyalgia    Gout 04/2017   Hyperlipidemia    Hypertension    Hypothyroidism        Low kidney function    followed by Dr Alyson Ingles   Spinal stenosis     Past Surgical History:  Procedure Laterality Date   Carpel Tunnel Surgery Right 10/17/2018   both wrists   CATARACT EXTRACTION W/ INTRAOCULAR LENS IMPLANT Bilateral 10/25/2015   Right eye was first.  Left eye done 10/17.   CERVICAL BIOPSY  W/ LOOP ELECTRODE EXCISION  12/2008   hx CIN II   COLONOSCOPY     LUMBAR FUSION     RADIOLOGY WITH ANESTHESIA N/A 07/26/2020   Procedure: MRI WITH ANESTHESIA  LUMBAR WITH AND WITHOUT CONTRAST;  Surgeon: Radiologist, Medication, MD;  Location: [facility];  Service: Radiology;  Laterality: N/A;   Family History  Problem Relation Age of Onset   Osteoporosis Mother    Hypertension Mother    Uterine cancer Mother    Heart disease Mother        Atrial fibrillation   Breast cancer Sister 37   Osteoporosis Sister    Diabetes Sister    Diabetes Father    Heart disease Sister    Social History   Tobacco Use    Smoking status: Former    Packs/day: 0.25    Years: 5.00    Pack years: 1.25    Types: Cigarettes    Quit date: 1975    Years since quitting: 47.5   Smokeless tobacco: Never   Tobacco comments:    in the 66s  Substance Use Topics   Alcohol use: Yes    Alcohol/week: 1.0 standard drink    Types: 1 Standard drinks or equivalent per week    Comment: Occasional   Marital Status: Married   ROS   Review of Systems  Constitutional: Negative for malaise/fatigue and weight gain.  Cardiovascular:  Negative for chest pain, claudication, leg swelling, near-syncope, orthopnea, palpitations, paroxysmal nocturnal dyspnea and syncope.  Respiratory:  Negative for shortness of breath.   Musculoskeletal:  Positive for back pain (chronic).  Neurological:  Negative for dizziness.     Objective  Blood pressure 113/70, pulse 70, temperature 97.8 F (36.6 C), resp. rate 17, height 5\' 5"  (1.651 m), weight 230 lb (104.3 kg), last menstrual period 03/05/2000, SpO2 98 %. Body mass index is 38.27 kg/m.  Physical Exam Vitals reviewed.  HENT:     Head: Normocephalic and atraumatic.  Cardiovascular:     Rate and Rhythm:  Normal rate and regular rhythm.     Pulses: Intact distal pulses.          Carotid pulses are 2+ on the right side with bruit and 2+ on the left side.      Radial pulses are 2+ on the right side and 2+ on the left side.       Femoral pulses are 2+ on the right side and 2+ on the left side.      Popliteal pulses are 2+ on the right side and 2+ on the left side.       Dorsalis pedis pulses are 2+ on the right side and 2+ on the left side.       Posterior tibial pulses are 2+ on the right side and 2+ on the left side.     Heart sounds: S1 normal and S2 normal. No murmur heard.   No gallop.  Pulmonary:     Effort: Pulmonary effort is normal. No respiratory distress.     Breath sounds: No wheezing, rhonchi or rales.  Musculoskeletal:     Right lower leg: No edema.     Left lower leg: No  edema.  Neurological:     Mental Status: She is alert.   Laboratory examination:   External labs:  10/07/2019: Glucose 101, sodium 140, potassium 4.2, BUN 24, creatinine 0.99, GFR 57 Hemoglobin 12.1, hematocrit 34.8, platelet 178, MCV 89.7 A1c 5.7% TSH 0.746  06/09/2018: Potassium 4.3, serum glucose 122 mg, BUN 18, creatinine 1.2, AST 36, AST 54 both elevated.  Total cholesterol 222, triglycerides 215, HDL 54, LDL 127.  Uric acid 8.4, elevated.  ANA negative.  Rheumatoid factor negative.  CMP Latest Ref Rng & Units 07/26/2020 07/07/2020 06/27/2020  Glucose 70 - 99 mg/dL 107(H) 91 106(H)  BUN 8 - 23 mg/dL 17 15 16   Creatinine 0.44 - 1.00 mg/dL 1.01(H) 0.99 0.82  Sodium 135 - 145 mmol/L 138 141 142  Potassium 3.5 - 5.1 mmol/L 3.7 4.6 4.1  Chloride 98 - 111 mmol/L 105 102 109  CO2 22 - 32 mmol/L 24 23 28   Calcium 8.9 - 10.3 mg/dL 9.4 9.6 9.7  Total Protein 6.0 - 8.5 g/dL - - -  Total Bilirubin 0.0 - 1.2 mg/dL - - -  Alkaline Phos 39 - 117 IU/L - - -  AST 0 - 40 IU/L - - -  ALT 0 - 32 IU/L - - -   CBC Latest Ref Rng & Units 06/27/2020 01/16/2020 01/15/2020  WBC 4.0 - 10.5 K/uL 4.4 5.9 5.7  Hemoglobin 12.0 - 15.0 g/dL 12.0 9.7(L) 8.9(L)  Hematocrit 36.0 - 46.0 % 36.2 28.9(L) 26.5(L)  Platelets 150 - 400 K/uL 192 165 123(L)   Lipid Panel     Component Value Date/Time   CHOL 121 10/22/2018 0815   TRIG 104 10/22/2018 0815   HDL 45 10/22/2018 0815   CHOLHDL 6.1 08/29/2013 0102   VLDL UNABLE TO CALCULATE IF TRIGLYCERIDE OVER 400 mg/dL 08/29/2013 0102   LDLCALC 55 10/22/2018 0815   LDLDIRECT 58 10/22/2018 0815   HEMOGLOBIN A1C No results found for: HGBA1C, MPG TSH No results for input(s): TSH in the last 8760 hours.  BNP 06/27/2020: 118  Radiology :  No results found.  Chest x-ray 06/27/2020: The heart size and mediastinal contours are within normal limits. Both lungs are clear. The visualized skeletal structures are unremarkable.   IMPRESSION:No active cardiopulmonary  disease  Cardiac Studies:  Stress Echo 09/25/2013: Patient exercised on Bruce protocol for  7:50 minutes and achieved 10.8 METS.  Achieved 107% of MPHR. - Stress echocardiogram with no chest pain nondiagnostic ST  changes; no stress-induced wall motion abnormalities; interpreted   as normal stress echocardiogram.  Lower extremity venous duplex 05/29/2018: No evidence of deep venous thrombosis. Left leg  Echocardiogram [facility] 06/17/2018: Normal LV systolic function, EF 16-10%.  Mild LVH.  No significant valvular abnormality.  [place] Stress Test 08/06/2018: Nondiagnostic ECG stress due to pharmacologic stress protocol. Normal myocardial perfusion.  Stress LV EF: 72%.  Low risk study.  Carotid artery duplex 06/07/2020:  No evidence of significant stenosis in the right carotid vessels.  Doppler velocity suggests stenosis in the left internal carotid artery  (16-49%). Left carotid artery demonstrates tortuosity. No significant  plaque burden noted and hence bruit or elevated velocity could be due to  tortuosity.  Antegrade right vertebral artery flow. Antegrade left vertebral artery  flow.  Follow up in one year is appropriate for follow up.  Venous ultrasound right leg 06/27/2020: Normal compressibility of the common femoral, superficial femoral, and popliteal veins, as well as the visualized calf veins. Limited visualization of the peroneal veins. Visualized portions of profunda femoral vein and great saphenous vein unremarkable. No filling defects to suggest DVT on grayscale or color Doppler imaging. Doppler waveforms show normal direction of venous flow, normal respiratory plasticity and response to augmentation.   Limited views of the contralateral common femoral vein are unremarkable.   Limitations: Limited visualization of the peroneal veins.   IMPRESSION: No evidence of DVT.  Echocardiogram 07/11/2020:  Left ventricle cavity is normal in size. Mild  concentric hypertrophy of  the left ventricle. Normal global wall motion. Normal LV systolic function with EF 59%. Doppler evidence of grade I (impaired) diastolic dysfunction, normal LAP.  Mild pulmonic regurgitation.  No evidence of pulmonary hypertension.  EKG   EKG 05/25/2020: Sinus rhythm at a rate of 75 bpm with single PAC.  Left atrial enlargement.  Left axis, left anterior fascicular block.  Incomplete right bundle branch block.  Nonspecific T wave abnormality.  Compared to EKG 07/17/2018, no significant change.  EKG 07/17/2018: Normal sinus rhythm at rate of 75 bpm, left atrial enlargement, normal axis.  Incomplete right bundle branch block.  No evidence of ischemia.  Assessment     ICD-10-CM   1. Essential hypertension  I10     2. Stenosis of left carotid artery  I65.22     3. Mixed hyperlipidemia  E78.2         No orders of the defined types were placed in this encounter.  There are no discontinued medications.   Recommendations:   Patricia Parks is a pleasant 75 y.o.Caucasian femalewith fibromyalgia, anxiety, depression, hyperlipidemia, hypertension.   Patient presents for 8-week follow-up of leg swelling and hypertension as well as results of echocardiogram.  Since last visit patient's leg swelling has resolved and she has had no recurrence.  With addition of chlorthalidone patient's blood pressure is now well controlled and BMP remained stable.  Reviewed and discussed with patient regarding results of echocardiogram, details above.  Echo revealed LVEF of 59% with mild hypertrophy of the LV and grade 1 diastolic dysfunction.   Will continue management of cardiovascular risk factors.  Counseled patient regarding diet and lifestyle modifications including weight loss.  Patient is making significant effort to lose weight.  We will plan to repeat carotid artery surveillance in 1 year.  Will not make changes to patient's medications at this time.  She is otherwise stable  from a cardiovascular standpoint.  Follow-up in 1 year, sooner if needed, for carotid artery stenosis, hypertension, hyperlipidemia.    Patricia Berthold, PA-C 08/26/2020, 3:39 PM Office: [phone]

## 2020-09-07 ENCOUNTER — Ambulatory Visit: Payer: Medicare Other | Admitting: Podiatry

## 2020-09-07 ENCOUNTER — Ambulatory Visit (INDEPENDENT_AMBULATORY_CARE_PROVIDER_SITE_OTHER): Payer: Medicare Other

## 2020-09-07 ENCOUNTER — Other Ambulatory Visit: Payer: Self-pay

## 2020-09-07 DIAGNOSIS — M722 Plantar fascial fibromatosis: Secondary | ICD-10-CM

## 2020-09-07 MED ORDER — BETAMETHASONE SOD PHOS & ACET 6 (3-3) MG/ML IJ SUSP
3.0000 mg | Freq: Once | INTRAMUSCULAR | Status: AC
  Administered 2020-09-07: 3 mg via INTRA_ARTICULAR

## 2020-09-07 NOTE — Progress Notes (Signed)
   Subjective: 75 y.o. female presenting to the office today for evaluation of bilateral heel pain.  Patient was last seen in the office approximately 1 year ago.  Patient states that she has been having back issues that radiate down to her legs.  She is currently being treated for the back pain.  She presents for further treatment and evaluation   Past Medical History:  Diagnosis Date   Anxiety    Depression    Fatty liver    Fibromyalgia    Gout 04/2017   Hyperlipidemia    Hypertension    Hypothyroidism        Low kidney function    followed by Dr Alyson Ingles   Spinal stenosis      Objective: Physical Exam General: The patient is alert and oriented x3 in no acute distress.  Dermatology: Skin is warm, dry and supple bilateral lower extremities. Negative for open lesions or macerations bilateral.   Vascular: Dorsalis Pedis and Posterior Tibial pulses palpable bilateral.  Capillary fill time is immediate to all digits.  Neurological: Epicritic and protective threshold intact bilateral.   Musculoskeletal: Tenderness to palpation to the plantar aspect of the bilateral heels along the plantar fascia. All other joints range of motion within normal limits bilateral. Strength 5/5 in all groups bilateral.   Radiographic exam: Normal osseous mineralization. Joint spaces preserved. No fracture/dislocation/boney destruction. No other soft tissue abnormalities or radiopaque foreign bodies.   Assessment: 1. plantar fasciitis bilateral feet  Plan of Care:  1. Patient evaluated. Xrays reviewed.   2. Injection of 0.5cc Celestone soluspan injected into the bilateral heels.  3.  Patient currently taking medication for her back.  Continue as per prescribing physician 4.  Return to clinic in 6 weeks  Edrick Kins, DPM [facility]  Dr. Edrick Kins, DPM    [address], [place] [address]                Office [phone]  Fax  [phone]

## 2020-09-16 ENCOUNTER — Other Ambulatory Visit: Payer: Self-pay | Admitting: Neurosurgery

## 2020-09-16 DIAGNOSIS — M48062 Spinal stenosis, lumbar region with neurogenic claudication: Secondary | ICD-10-CM

## 2020-09-21 ENCOUNTER — Ambulatory Visit
Admission: RE | Admit: 2020-09-21 | Discharge: 2020-09-21 | Disposition: A | Discharge status: Home / Self Care | Payer: Medicare Other | Source: Ambulatory Visit | Attending: Neurosurgery | Admitting: Neurosurgery

## 2020-09-21 ENCOUNTER — Other Ambulatory Visit: Payer: Medicare Other

## 2020-09-21 ENCOUNTER — Ambulatory Visit
Admission: RE | Admit: 2020-09-21 | Discharge: 2020-09-21 | Disposition: A | Discharge status: Home / Self Care | Payer: Medicare Other | Source: Ambulatory Visit | Attending: Endocrinology | Admitting: Endocrinology

## 2020-09-21 DIAGNOSIS — M48062 Spinal stenosis, lumbar region with neurogenic claudication: Secondary | ICD-10-CM

## 2020-09-21 DIAGNOSIS — E049 Nontoxic goiter, unspecified: Secondary | ICD-10-CM

## 2020-09-22 ENCOUNTER — Other Ambulatory Visit: Payer: Medicare Other

## 2020-10-19 ENCOUNTER — Ambulatory Visit (INDEPENDENT_AMBULATORY_CARE_PROVIDER_SITE_OTHER): Payer: Medicare Other | Admitting: Podiatry

## 2020-10-19 ENCOUNTER — Other Ambulatory Visit: Payer: Self-pay

## 2020-10-19 DIAGNOSIS — M5416 Radiculopathy, lumbar region: Secondary | ICD-10-CM | POA: Diagnosis not present

## 2020-10-19 DIAGNOSIS — G629 Polyneuropathy, unspecified: Secondary | ICD-10-CM

## 2020-10-19 DIAGNOSIS — M722 Plantar fascial fibromatosis: Secondary | ICD-10-CM

## 2020-10-19 MED ORDER — MELOXICAM 15 MG PO TABS: 15.0000 mg | ORAL_TABLET | Freq: Every day | ORAL | 1 refills | Status: DC

## 2020-10-19 NOTE — Progress Notes (Signed)
   Subjective: 75 y.o. female presenting to the office today for follow-up evaluation of bilateral heel pain.  Patient states that she continues to have pain and tenderness beginning in her upper thighs and extending all the way down to her feet.  She is on chronic pain management.  She believes that she has not improved since last visit.  She presents for further treatment and evaluation   Past Medical History:  Diagnosis Date   Anxiety    Depression    Fatty liver    Fibromyalgia    Gout 04/2017   Hyperlipidemia    Hypertension    Hypothyroidism        Low kidney function    followed by Dr Alyson Ingles   Spinal stenosis      Objective: Physical Exam General: The patient is alert and oriented x3 in no acute distress.  Dermatology: Skin is warm, dry and supple bilateral lower extremities. Negative for open lesions or macerations bilateral.   Vascular: Dorsalis Pedis and Posterior Tibial pulses palpable bilateral.  Capillary fill time is immediate to all digits.  Neurological: Epicritic and protective threshold intact bilateral.   Musculoskeletal: Negative for any significant tenderness to palpation to the plantar aspect of the bilateral heels along the plantar fascia. All other joints range of motion within normal limits bilateral. Strength 5/5 in all groups bilateral.    Assessment: 1. plantar fasciitis bilateral feet; resolved 2.  Lumbar radiculopathy 3.  Peripheral polyneuropathy  Plan of Care:  1. Patient evaluated.  Fortunately the patient states that her heel pain has gone although she states that she continues to have pain and tenderness in her feet 2.  I explained to the patient that most likely her pain is coming from her lumbar spine since she experiences the pain extending proximal up to her legs thighs and buttocks symmetrically 3.  Continue Lyrica 300 mg BID and Vicodin as per pain management 4.  Prescription for meloxicam 15 mg daily to add to the regimen to see if  this helps reduce inflammation 5.  Continue wearing custom molded orthotics 6.  Return to clinic as needed  Edrick Kins, DPM [facility]  Dr. Edrick Kins, DPM    [address], [place] [address]                Office [phone]  Fax [phone]

## 2020-10-20 ENCOUNTER — Telehealth: Payer: Self-pay | Admitting: Podiatry

## 2020-10-20 NOTE — Telephone Encounter (Signed)
Patient called our office stating that she had been seen by Dr.evans but hasn't received her prescription.

## 2020-10-22 ENCOUNTER — Other Ambulatory Visit: Payer: Self-pay | Admitting: Student

## 2020-11-01 NOTE — Telephone Encounter (Signed)
Prescription for meloxicam was sent to [facility] on the corner of [address].-Dr. Amalia Hailey

## 2020-11-07 ENCOUNTER — Other Ambulatory Visit: Payer: Self-pay | Admitting: Cardiology

## 2020-11-07 DIAGNOSIS — I10 Essential (primary) hypertension: Secondary | ICD-10-CM

## 2021-02-13 ENCOUNTER — Other Ambulatory Visit: Payer: Self-pay | Admitting: Student

## 2021-03-08 ENCOUNTER — Other Ambulatory Visit: Payer: Self-pay | Admitting: Student

## 2021-03-08 DIAGNOSIS — E782 Mixed hyperlipidemia: Secondary | ICD-10-CM

## 2021-05-15 ENCOUNTER — Other Ambulatory Visit: Payer: Self-pay | Admitting: Student

## 2021-05-23 ENCOUNTER — Other Ambulatory Visit: Payer: Self-pay | Admitting: Neurosurgery

## 2021-06-09 NOTE — Pre-Procedure Instructions (Signed)
Surgical Instructions ? ? ? Your procedure is scheduled on Wednesday, April 19th. ? Report to [facility] Main Entrance "A" at 6:30 A.M., then check in with the Admitting office. ? Call this number if you have problems the morning of surgery: ? [phone] ? ? If you have any questions prior to your surgery date call [phone]: Open Monday-Friday 8am-4pm ? ? ? Remember: ? Do not eat or drink after midnight the night before your surgery ?  ? Take these medicines the morning of surgery with A SIP OF WATER:  ?allopurinol (ZYLOPRIM)  ?amLODipine (NORVASC) ?busPIRone (BUSPAR)  ?levothyroxine (SYNTHROID)  ?metoprolol succinate (TOPROL-XL) ?pregabalin (LYRICA)  ?rosuvastatin (CRESTOR)  ? ?As needed: ?HYDROcodone-acetaminophen ([place])  ? ?Follow your surgeon's instructions on when to stop Aspirin.  If no instructions were given by your surgeon then you will need to call the office to get those instructions.   ? ?As of today, STOP taking any Aleve, Naproxen, Ibuprofen, Motrin, Advil, Goody's, BC's, all herbal medications, fish oil, and all vitamins. ? ?         ?Do not wear jewelry or makeup ?Do not wear lotions, powders, perfumes, or deodorant. ?Do not shave 48 hours prior to surgery.  ?Do not bring valuables to the hospital. ?Do not wear nail polish, gel polish, artificial nails, or any other type of covering on natural nails (fingers and toes) ?If you have artificial nails or gel coating that need to be removed by a nail salon, please have this removed prior to surgery. Artificial nails or gel coating may interfere with anesthesia's ability to adequately monitor your vital signs. ? ?[facility] is not responsible for any belongings or valuables. .  ? ?Do NOT Smoke (Tobacco/Vaping)  24 hours prior to your procedure ? ?If you use a CPAP at night, you may bring your mask for your overnight stay. ?  ?Contacts, glasses, hearing aids, dentures or partials may not be worn into surgery, please bring cases for these  belongings ?  ?For patients admitted to the hospital, discharge time will be determined by your treatment team. ?  ?Patients discharged the day of surgery will not be allowed to drive home, and someone needs to stay with them for 24 hours. ? ? ?SURGICAL WAITING ROOM VISITATION ?Patients having surgery or a procedure in a hospital may have two support people. ?Children under the age of 33 must have an adult with them who is not the patient. ?They may stay in the waiting area during the procedure and may switch out with other visitors. If the patient needs to stay at the hospital during part of their recovery, the visitor guidelines for inpatient rooms apply. ? ?Please refer to the [facility] website for the visitor guidelines for Inpatients (after your surgery is over and you are in a regular room).  ? ? ? ? ? ?Special instructions:   ? ?Oral Hygiene is also important to reduce your risk of infection.  Remember - BRUSH YOUR TEETH THE MORNING OF SURGERY WITH YOUR REGULAR TOOTHPASTE ? ? ?[facility]- Preparing For Surgery ? ?Before surgery, you can play an important role. Because skin is not sterile, your skin needs to be as free of germs as possible. You can reduce the number of germs on your skin by washing with CHG (chlorahexidine gluconate) Soap before surgery.  CHG is an antiseptic cleaner which kills germs and bonds with the skin to continue killing germs even after washing.   ? ? ?Please do not use if  you have an allergy to CHG or antibacterial soaps. If your skin becomes reddened/irritated stop using the CHG.  ?Do not shave (including legs and underarms) for at least 48 hours prior to first CHG shower. It is OK to shave your face. ? ?Please follow these instructions carefully. ?  ? ? Shower the NIGHT BEFORE SURGERY and the MORNING OF SURGERY with CHG Soap.  ? If you chose to wash your hair, wash your hair first as usual with your normal shampoo. After you shampoo, rinse your hair and body thoroughly to remove  the shampoo.  Then [employer] and genitals (private parts) with your normal soap and rinse thoroughly to remove soap. ? ?After that Use CHG Soap as you would any other liquid soap. You can apply CHG directly to the skin and wash gently with a scrungie or a clean washcloth.  ? ?Apply the CHG Soap to your body ONLY FROM THE NECK DOWN.  Do not use on open wounds or open sores. Avoid contact with your eyes, ears, mouth and genitals (private parts). Wash Face and genitals (private parts)  with your normal soap.  ? ?Wash thoroughly, paying special attention to the area where your surgery will be performed. ? ?Thoroughly rinse your body with warm water from the neck down. ? ?DO NOT shower/wash with your normal soap after using and rinsing off the CHG Soap. ? ?Pat yourself dry with a CLEAN TOWEL. ? ?Wear CLEAN PAJAMAS to bed the night before surgery ? ?Place CLEAN SHEETS on your bed the night before your surgery ? ?DO NOT SLEEP WITH PETS. ? ? ?Day of Surgery: ? ?Take a shower with CHG soap. ?Wear Clean/Comfortable clothing the morning of surgery ?Do not apply any deodorants/lotions.   ?Remember to brush your teeth WITH YOUR REGULAR TOOTHPASTE. ? ? ? ?If you received a COVID test during your pre-op visit  it is requested that you wear a mask when out in public, stay away from anyone that may not be feeling well and notify your surgeon if you develop symptoms. If you have been in contact with anyone that has tested positive in the last 10 days please notify you surgeon. ? ?  ?Please read over the following fact sheets that you were given.  ? ?

## 2021-06-12 ENCOUNTER — Other Ambulatory Visit: Payer: Self-pay

## 2021-06-12 ENCOUNTER — Encounter (HOSPITAL_COMMUNITY)
Admission: RE | Admit: 2021-06-12 | Discharge: 2021-06-12 | Disposition: A | Discharge status: Home / Self Care | Payer: Medicare Other | Source: Ambulatory Visit | Attending: Neurosurgery | Admitting: Neurosurgery

## 2021-06-12 ENCOUNTER — Encounter (HOSPITAL_COMMUNITY): Payer: Self-pay

## 2021-06-12 VITALS — BP 143/73 | HR 59 | Temp 97.9°F | Resp 18 | Ht 66.0 in | Wt 229.3 lb

## 2021-06-12 DIAGNOSIS — Z01818 Encounter for other preprocedural examination: Secondary | ICD-10-CM

## 2021-06-12 DIAGNOSIS — Z01812 Encounter for preprocedural laboratory examination: Secondary | ICD-10-CM | POA: Insufficient documentation

## 2021-06-12 HISTORY — DX: Unspecified osteoarthritis, unspecified site: M19.90

## 2021-06-12 LAB — BASIC METABOLIC PANEL
Anion gap: 7 (ref 5–15)
BUN: 13 mg/dL (ref 8–23)
CO2: 28 mmol/L (ref 22–32)
Calcium: 9.4 mg/dL (ref 8.9–10.3)
Chloride: 102 mmol/L (ref 98–111)
Creatinine, Ser: 0.93 mg/dL (ref 0.44–1.00)
GFR, Estimated: >60 mL/min (ref >60)
Glucose, Bld: 115 mg/dL — ABNORMAL HIGH (ref 70–99)
Potassium: 3.6 mmol/L (ref 3.5–5.1)
Sodium: 137 mmol/L (ref 135–145)

## 2021-06-12 LAB — CBC
HCT: 37.6 % (ref 36.0–46.0)
Hemoglobin: 13 g/dL (ref 12.0–15.0)
MCH: 31.5 pg (ref 26.0–34.0)
MCHC: 34.6 g/dL (ref 30.0–36.0)
MCV: 91 fL (ref 80.0–100.0)
Platelets: 203 10*3/uL (ref 150–400)
RBC: 4.13 MIL/uL (ref 3.87–5.11)
RDW: 13.2 % (ref 11.5–15.5)
WBC: 3.7 10*3/uL — ABNORMAL LOW (ref 4.0–10.5)
nRBC: 0 % (ref 0.0–0.2)

## 2021-06-12 LAB — TYPE AND SCREEN
ABO/RH(D): A POS
Antibody Screen: NEGATIVE

## 2021-06-12 LAB — SURGICAL PCR SCREEN
MRSA, PCR: NEGATIVE
Staphylococcus aureus: NEGATIVE

## 2021-06-12 NOTE — Progress Notes (Signed)
PCP - Isaias Cowman ?Cardiologist - Lawerance Cruel, [facility] ?Patient stated she has a follow up appointment with cardiology this week.  Instructed patient to ask for cardiac clearance. ? ?Chest x-ray - n/a ?EKG - 06/30/20 ?Stress Test - 08/3018 ?ECHO - 07/12/20 ? ?Aspirin Instructions: follow your surgeon's instructions on when to stop ASA ? ?COVID TEST- n/a ? ? ?Anesthesia review: yes, patient stated she has a regular follow up appointment with cardiology this week, see above note.  ? ?Patient denies shortness of breath, fever, cough and chest pain at PAT appointment ? ? ?All instructions explained to the patient, with a verbal understanding of the material. Patient agrees to go over the instructions while at home for a better understanding. Patient also instructed to self quarantine after being tested for COVID-19. The opportunity to ask questions was provided. ? ? ?

## 2021-06-13 ENCOUNTER — Encounter (HOSPITAL_COMMUNITY): Payer: Self-pay | Admitting: Physician Assistant

## 2021-06-14 ENCOUNTER — Ambulatory Visit: Payer: Medicare Other

## 2021-06-14 ENCOUNTER — Telehealth: Payer: Self-pay | Admitting: Student

## 2021-06-14 DIAGNOSIS — I6522 Occlusion and stenosis of left carotid artery: Secondary | ICD-10-CM

## 2021-06-14 NOTE — Telephone Encounter (Signed)
Patient is having a carotid duplex today 06/14/21. She stated she is also having back surgery on 06/21/21 with Dr. Kary Kos. She said when she asked about a clearance from our office, they told her that would be up to [place]. She is asking if she needs a clearance. Do you need someone to send over a form to [place], and/or does she need an appointment? It does not look like she has been seen since 08/25/20. I told her I would reach out and speak with you so we can get whatever we need done asap. ?

## 2021-06-14 NOTE — Progress Notes (Signed)
Anesthesia Chart Review: ? ?Follows with [facility] cardiology for history of hypertension, hyperlipidemia, bilateral leg edema, mild carotid stenosis.  Last seen by Lawerance Cruel, PA-C 08/25/2020.  She was noted to be doing well at that time and recommended 1 year follow-up.  Patient reached out to her cardiologist regarding upcoming surgery.  Per telephone encounter by Lawerance Cruel, PA-C on 06/14/2021, "As long as her surgeon is not requesting it then we dont need to send any forms. She has been stable from our standpoint so I would not say she needs an appt prior to surgery. Please just make sure she does have a follow up on the books at some point." ? ?Preop labs reviewed, unremarkable. ? ?EKG 06/27/2020: Normal sinus rhythm.  Rate 66. Low voltage QRS. Cannot rule out Anterior infarct , age undetermined ? ?Carotid artery duplex 06/14/2021:  ?No evidence of significant stenosis in the right carotid vessels.  ?Duplex suggests stenosis in the left internal carotid artery (16-49%).  ?There is no significant plaque burden. This study may represent FMD (mild).  ?Antegrade right vertebral artery flow. Antegrade left vertebral artery flow.  ?No significant change 06/07/2020. ? ?Echocardiogram 07/11/2020:  ?Left ventricle cavity is normal in size. Mild concentric hypertrophy of  the left ventricle. Normal global wall motion. Normal LV systolic function with EF 59%. Doppler evidence of grade I (impaired) diastolic dysfunction, normal LAP.  ?Mild pulmonic regurgitation.  ?No evidence of pulmonary hypertension. ? ?.[place] Stress Test 08/06/2018: ?Nondiagnostic ECG stress due to pharmacologic stress protocol. ?Normal myocardial perfusion.  Stress LV EF: 72%.  Low risk study. ?  ? ? ?Karoline Caldwell, PA-C ?[facility]/Anesthesiology ?Phone [phone] ?06/19/2021 10:00 AM ? ?

## 2021-06-14 NOTE — Telephone Encounter (Signed)
As long as her surgeon is not requesting it then we dont need to send any forms. She has been stable from our standpoint so I would not say she needs an appt prior to surgery. Please just make sure she does have a follow up on the books at some point.

## 2021-06-19 NOTE — Progress Notes (Signed)
Called and spoke with patient regarding her CAD results.  ?Call transferred to front desk staff [facility] [phone]) to schedule FU appointment with CC.

## 2021-06-19 NOTE — Anesthesia Preprocedure Evaluation (Deleted)
Anesthesia Evaluation    Airway        Dental   Pulmonary former smoker,           Cardiovascular hypertension,      Neuro/Psych    GI/Hepatic   Endo/Other    Renal/GU      Musculoskeletal   Abdominal   Peds  Hematology   Anesthesia Other Findings   Reproductive/Obstetrics                             Anesthesia Physical Anesthesia Plan  ASA:   Anesthesia Plan:    Post-op Pain Management:    Induction:   PONV Risk Score and Plan:   Airway Management Planned:   Additional Equipment:   Intra-op Plan:   Post-operative Plan:   Informed Consent:   Plan Discussed with:   Anesthesia Plan Comments: (PAT note by Karoline Caldwell, PA-C: Follows with [facility] cardiology for history of hypertension, hyperlipidemia, bilateral leg edema, mild carotid stenosis.  Last seen by Lawerance Cruel, PA-C 08/25/2020.  She was noted to be doing well at that time and recommended 1 year follow-up.  Patient reached out to her cardiologist regarding upcoming surgery.  Per telephone encounter by Lawerance Cruel, PA-C on 06/14/2021, "As long as her surgeon is not requesting it then we dont need to send any forms. She has been stable from our standpoint so I would not say she needs an appt prior to surgery. Please just make sure she does have a follow up on the books at some point."  Preop labs reviewed, unremarkable.  EKG 06/27/2020: Normal sinus rhythm.  Rate 66. Low voltage QRS. Cannot rule out Anterior infarct , age undetermined  Carotid artery duplex 06/14/2021:  No evidence of significant stenosis in the right carotid vessels.  Duplex suggests stenosis in the left internal carotid artery (16-49%).  There is no significant plaque burden. This study may represent FMD (mild).  Antegrade right vertebral artery flow. Antegrade left vertebral artery flow.  No significant change 06/07/2020.  Echocardiogram  07/11/2020:  Left ventricle cavity is normal in size. Mild concentric hypertrophy of the left ventricle. Normal global wall motion. Normal LV systolic function with EF 59%. Doppler evidence of grade I (impaired) diastolic dysfunction, normal LAP.  Mild pulmonic regurgitation.  No evidence of pulmonary hypertension.  [facility] Stress Test06/05/2018: Nondiagnostic ECG stress due to pharmacologic stress protocol. Normal myocardial perfusion. Stress LV EF: 72%. Low risk study.  )        Anesthesia Quick Evaluation

## 2021-06-30 ENCOUNTER — Other Ambulatory Visit: Payer: Medicare Other

## 2021-07-17 ENCOUNTER — Emergency Department (HOSPITAL_BASED_OUTPATIENT_CLINIC_OR_DEPARTMENT_OTHER)
Admission: EM | Admit: 2021-07-17 | Discharge: 2021-07-17 | Disposition: A | Discharge status: Home / Self Care | Payer: Medicare Other | Attending: Emergency Medicine | Admitting: Emergency Medicine

## 2021-07-17 ENCOUNTER — Emergency Department (HOSPITAL_BASED_OUTPATIENT_CLINIC_OR_DEPARTMENT_OTHER): Payer: Medicare Other | Admitting: Radiology

## 2021-07-17 ENCOUNTER — Encounter (HOSPITAL_BASED_OUTPATIENT_CLINIC_OR_DEPARTMENT_OTHER): Payer: Self-pay | Admitting: Obstetrics and Gynecology

## 2021-07-17 ENCOUNTER — Other Ambulatory Visit: Payer: Self-pay

## 2021-07-17 DIAGNOSIS — I10 Essential (primary) hypertension: Secondary | ICD-10-CM | POA: Diagnosis not present

## 2021-07-17 DIAGNOSIS — Z7982 Long term (current) use of aspirin: Secondary | ICD-10-CM | POA: Diagnosis not present

## 2021-07-17 DIAGNOSIS — Z79899 Other long term (current) drug therapy: Secondary | ICD-10-CM | POA: Insufficient documentation

## 2021-07-17 DIAGNOSIS — M791 Myalgia, unspecified site: Secondary | ICD-10-CM | POA: Diagnosis present

## 2021-07-17 DIAGNOSIS — R1032 Left lower quadrant pain: Secondary | ICD-10-CM | POA: Insufficient documentation

## 2021-07-17 DIAGNOSIS — M7918 Myalgia, other site: Secondary | ICD-10-CM

## 2021-07-17 MED ORDER — MELOXICAM 15 MG PO TABS
15.0000 mg | ORAL_TABLET | Freq: Every day | ORAL | 0 refills | Status: AC
Start: 2021-07-17 — End: 2021-08-01

## 2021-07-17 NOTE — Discharge Instructions (Addendum)
You were seen today for pain in the left buttock and the left groin crease.  Work-up shows no sign of fracture or dislocation.  I recommend follow-up with your orthopedic team for further evaluation and management.  I did provide a short course of meloxicam but I recommend that she discuss this medication with your neurosurgery team to be sure that it is okay with your preoperative clearance plan. ?

## 2021-07-17 NOTE — ED Triage Notes (Signed)
Patient reports she is set to have back surgery soon and is having left sided hip pain. Patient reports she has had no recent injury but that this hip pain is different than her normal back pain that runs down her leg. Patient reports this feels like it is in the groin area.  ?

## 2021-07-17 NOTE — ED Notes (Signed)
ED Provider at bedside. 

## 2021-07-17 NOTE — ED Provider Notes (Signed)
?[facility] DEPT ?Provider Note ? ? ?CSN: [id number] ?Arrival date & time: 07/17/21  1446 ? ?  ? ?History ? ?Chief Complaint  ?Patient presents with  ? Hip Pain  ? ? ?Patricia Parks is a 76 y.o. female.  Patient presents to the hospital complaining of left-sided hip pain.  She states that for the past few days she has had pain in her left buttock and in the left groin.  The patient has history of low back problems with radicular symptoms and has back surgery scheduled for the end of this month.  She states that this pain is different.  She denies any recent falls or injuries.  Past medical history significant for spondylolisthesis at L4-L5, hypertension, elevated lipids, pain in left knee, gout, arthritis, fibromyalgia ? ?HPI ? ?  ? ?Home Medications ?Prior to Admission medications   ?Medication Sig Start Date End Date Taking? Authorizing Provider  ?meloxicam (MOBIC) 15 MG tablet Take 1 tablet (15 mg total) by mouth daily for 15 days. 07/17/21 08/01/21 Yes Dorothyann Peng, PA-C  ?allopurinol (ZYLOPRIM) 100 MG tablet Take 100 mg by mouth daily.    [provider]  ?amLODipine (NORVASC) 2.5 MG tablet Take 2.5 mg by mouth daily.    [provider]  ?aspirin EC 81 MG tablet Take 1 tablet (81 mg total) by mouth daily. Swallow whole. 06/30/20   Cantwell, Celeste C, PA-C  ?busPIRone (BUSPAR) 7.5 MG tablet Take 7.5 mg by mouth 2 (two) times daily. 01/07/19   [provider]  ?Calcium Carbonate (CALCARB 600 PO) Take 1 tablet by mouth daily.    [provider]  ?chlorthalidone (HYGROTON) 25 MG tablet TAKE 1/2 TABLET(12.5 MG) BY MOUTH DAILY 05/15/21   Cantwell, Celeste C, PA-C  ?cholecalciferol (VITAMIN D3) 25 MCG (1000 UNIT) tablet Take 1,000 Units by mouth daily.    [provider]  ?furosemide (LASIX) 20 MG tablet Take 1 tablet (20 mg total) by mouth daily as needed for edema. 06/27/20   Lucrezia Starch, MD  ?HYDROcodone-acetaminophen (NORCO) 10-325 MG  tablet Take 0.5-1 tablets by mouth See admin instructions. Will take 0.5 tabs twice daily, and  1 tab at bedtime    [provider]  ?levothyroxine (SYNTHROID) 75 MCG tablet Take 75 mcg by mouth daily before breakfast.    [provider]  ?losartan (COZAAR) 100 MG tablet TAKE 1 TABLET BY MOUTH EVERY DAY 11/08/20   Cantwell, Celeste C, PA-C  ?metoprolol succinate (TOPROL-XL) 50 MG 24 hr tablet Take 50 mg by mouth daily. Take with or immediately following a meal.    [provider]  ?Omega-3 Fatty Acids (SALMON OIL) 200 MG CAPS Take 1 capsule by mouth 2 (two) times daily.    [provider]  ?OVER THE COUNTER MEDICATION Apply 1 application. topically in the morning and at bedtime. CBD Oil for back and feet pain    [provider]  ?pregabalin (LYRICA) 300 MG capsule Take 300 mg by mouth 2 (two) times daily.  01/14/19   [provider]  ?rosuvastatin (CRESTOR) 10 MG tablet TAKE 1 TABLET(10 MG) BY MOUTH DAILY 03/08/21   Cantwell, Celeste C, PA-C  ?vitamin C (ASCORBIC ACID) 500 MG tablet Take 500 mg by mouth daily.    [provider]  ?   ? ?Allergies    ?Patient has no known allergies.   ? ?Review of Systems   ?Review of Systems  ?Constitutional:  Negative for fever.  ?Respiratory:  Negative for  shortness of breath.   ?Cardiovascular:  Negative for chest pain.  ?Gastrointestinal:  Negative for abdominal pain.  ?Musculoskeletal:  Positive for arthralgias and back pain. Negative for joint swelling.  ?Skin:  Negative for wound.  ? ?Physical Exam ?Updated Vital Signs ?BP (!) 122/57   Pulse 61   Temp 98.2 ?F (36.8 ?C)   Resp 18   Ht '5\' 6"'$  (1.676 m)   LMP 03/05/2000   SpO2 97%   BMI 37.01 kg/m?  ?Physical Exam ?Vitals and nursing note reviewed.  ?Constitutional:   ?   General: She is not in acute distress. ?HENT:  ?   Head: Normocephalic and atraumatic.  ?Eyes:  ?   Conjunctiva/sclera: Conjunctivae normal.  ?Cardiovascular:  ?   Rate and Rhythm: Normal rate and  regular rhythm.  ?   Pulses: Normal pulses.  ?   Heart sounds: Normal heart sounds.  ?Pulmonary:  ?   Effort: Pulmonary effort is normal.  ?   Breath sounds: Normal breath sounds.  ?Musculoskeletal:  ?   Cervical back: Normal range of motion and neck supple.  ?   Comments: Pain in left buttock and groin with hip flexion and internal rotation.  ?Neurological:  ?   Mental Status: She is alert.  ? ? ?ED Results / Procedures / Treatments   ?Labs ?(all labs ordered are listed, but only abnormal results are displayed) ?Labs Reviewed - No data to display ? ?EKG ?None ? ?Radiology ?DG Hip Unilat W or Wo Pelvis 2-3 Views Left ? ?Result Date: 07/17/2021 ?CLINICAL DATA:  Left hip pain EXAM: DG HIP (WITH OR WITHOUT PELVIS) 2-3V LEFT COMPARISON:  None Available. FINDINGS: Postoperative changes in the lower lumbar spine. Hip joints and SI joints symmetric. No acute bony abnormality. Specifically, no fracture, subluxation, or dislocation. Calcifications in the pelvis likely calcified fibroids. IMPRESSION: No acute bony abnormality. Electronically Signed   By: Rolm Baptise M.D.   On: 07/17/2021 17:28   ? ?Procedures ?Procedures  ? ? ?Medications Ordered in ED ?Medications - No data to display ? ?ED Course/ Medical Decision Making/ A&P ?  ?                        ?Medical Decision Making ?Amount and/or Complexity of Data Reviewed ?Radiology: ordered. ? ? ?Patient presents with left hip pain.  Differential includes but is not limited to fracture, dislocation, osteoarthritis, soft tissue injury, radiation from low back ? ?She had no recent injury.  I feel that fracture, dislocation, and soft tissue injuries are unlikely. ? ?I ordered and interpreted plain x-rays of the left hip.  No fracture or dislocation was noted.  I agree with the radiologist findings. ? ?I discussed possible pain medication with the patient but the patient already takes 5 mg multiple times per day for chronic pain.  Patient states that she would not like  additional medicine at this time which I believe is wise. ? ?Based on the lack of obvious hip pathology, I believe this is likely coming some help from the low back.  There is no indication for further emergent work-up at this time.  Recommend she follow-up with the orthopedist and neurosurgeon for further evaluation and management.  I did provide a short course of meloxicam but recommended to the patient that she discuss this with her neurosurgeon to be sure that it is okay with her upcoming surgery.  Discharge home ? ?Final Clinical Impression(s) / ED Diagnoses ?Final diagnoses:  ?Left  buttock pain  ?Left groin pain  ? ? ?Rx / DC Orders ?ED Discharge Orders   ? ?      Ordered  ?  meloxicam (MOBIC) 15 MG tablet  Daily       ? 07/17/21 1745  ? ?  ?  ? ?  ? ? ?  ?Dorothyann Peng, PA-C ?07/17/21 1746 ? ?  ?Regan Lemming, MD ?07/18/21 0107 ? ?

## 2021-07-21 NOTE — Pre-Procedure Instructions (Signed)
Surgical Instructions    Your procedure is scheduled on Wednesday, May 31.  Report to [facility] Main Entrance "A" at 10:30 A.M., then check in with the Admitting office.  Call this number if you have problems the morning of surgery:  [phone]   If you have any questions prior to your surgery date call [phone]: Open Monday-Friday 8am-4pm    Remember:  Do not eat or drink after midnight the night before your surgery    Take these medicines the morning of surgery with A SIP OF WATER:   metoprolol succinate (TOPROL-XL) levothyroxine (SYNTHROID) allopurinol (ZYLOPRIM) pregabalin (LYRICA) rosuvastatin (CRESTOR)  amLODipine (NORVASC)  busPIRone (BUSPAR)   Follow your surgeon's instructions on when to stop Aspirin.  If no instructions were given by your surgeon then you will need to call the office to get those instructions.    As of today, STOP taking any Aleve, Naproxen, Ibuprofen, Motrin, Advil, Goody's, BC's, all herbal medications, fish oil, and all vitamins, meloxicam (MOBIC)           Do not wear jewelry or makeup Do not wear lotions, powders, perfumes/colognes, or deodorant. Do not shave 48 hours prior to surgery.  Men may shave face and neck. Do not bring valuables to the hospital. Do not wear nail polish, gel polish, artificial nails, or any other type of covering on natural nails (fingers and toes) If you have artificial nails or gel coating that need to be removed by a nail salon, please have this removed prior to surgery. Artificial nails or gel coating may interfere with anesthesia's ability to adequately monitor your vital signs.  [facility] is not responsible for any belongings or valuables. .   Do NOT Smoke (Tobacco/Vaping)  24 hours prior to your procedure  If you use a CPAP at night, you may bring your mask for your overnight stay.   Contacts, glasses, hearing aids, dentures or partials may not be worn into surgery, please bring cases for these  belongings   For patients admitted to the hospital, discharge time will be determined by your treatment team.   Patients discharged the day of surgery will not be allowed to drive home, and someone needs to stay with them for 24 hours.   SURGICAL WAITING ROOM VISITATION Patients having surgery or a procedure in a hospital may have two support people. Children under the age of 17 must have an adult with them who is not the patient. They may stay in the waiting area during the procedure and may switch out with other visitors. If the patient needs to stay at the hospital during part of their recovery, the visitor guidelines for inpatient rooms apply.  Please refer to the [facility] website for the visitor guidelines for Inpatients (after your surgery is over and you are in a regular room).       Special instructions:    Oral Hygiene is also important to reduce your risk of infection.  Remember - BRUSH YOUR TEETH THE MORNING OF SURGERY WITH YOUR REGULAR TOOTHPASTE   [facility]- Preparing For Surgery  Before surgery, you can play an important role. Because skin is not sterile, your skin needs to be as free of germs as possible. You can reduce the number of germs on your skin by washing with CHG (chlorahexidine gluconate) Soap before surgery.  CHG is an antiseptic cleaner which kills germs and bonds with the skin to continue killing germs even after washing.     Please do not use  if you have an allergy to CHG or antibacterial soaps. If your skin becomes reddened/irritated stop using the CHG.  Do not shave (including legs and underarms) for at least 48 hours prior to first CHG shower. It is OK to shave your face.  Please follow these instructions carefully.     Shower the NIGHT BEFORE SURGERY and the MORNING OF SURGERY with CHG Soap.   If you chose to wash your hair, wash your hair first as usual with your normal shampoo. After you shampoo, rinse your hair and body thoroughly to remove  the shampoo.  Then [employer] and genitals (private parts) with your normal soap and rinse thoroughly to remove soap.  After that Use CHG Soap as you would any other liquid soap. You can apply CHG directly to the skin and wash gently with a scrungie or a clean washcloth.   Apply the CHG Soap to your body ONLY FROM THE NECK DOWN.  Do not use on open wounds or open sores. Avoid contact with your eyes, ears, mouth and genitals (private parts). Wash Face and genitals (private parts)  with your normal soap.   Wash thoroughly, paying special attention to the area where your surgery will be performed.  Thoroughly rinse your body with warm water from the neck down.  DO NOT shower/wash with your normal soap after using and rinsing off the CHG Soap.  Pat yourself dry with a CLEAN TOWEL.  Wear CLEAN PAJAMAS to bed the night before surgery  Place CLEAN SHEETS on your bed the night before your surgery  DO NOT SLEEP WITH PETS.   Day of Surgery:  Take a shower with CHG soap. Wear Clean/Comfortable clothing the morning of surgery Do not apply any deodorants/lotions.   Remember to brush your teeth WITH YOUR REGULAR TOOTHPASTE.    If you received a COVID test during your pre-op visit, it is requested that you wear a mask when out in public, stay away from anyone that may not be feeling well, and notify your surgeon if you develop symptoms. If you have been in contact with anyone that has tested positive in the last 10 days, please notify your surgeon.    Please read over the following fact sheets that you were given.

## 2021-07-24 ENCOUNTER — Ambulatory Visit: Payer: Medicare Other | Admitting: Student

## 2021-07-24 ENCOUNTER — Encounter (HOSPITAL_COMMUNITY): Payer: Self-pay

## 2021-07-24 ENCOUNTER — Inpatient Hospital Stay: Payer: Medicare Other

## 2021-07-24 ENCOUNTER — Encounter: Payer: Self-pay | Admitting: Student

## 2021-07-24 ENCOUNTER — Encounter (HOSPITAL_COMMUNITY)
Admission: RE | Admit: 2021-07-24 | Discharge: 2021-07-24 | Disposition: A | Discharge status: Home / Self Care | Payer: Medicare Other | Source: Ambulatory Visit | Attending: Neurosurgery | Admitting: Neurosurgery

## 2021-07-24 ENCOUNTER — Other Ambulatory Visit: Payer: Self-pay

## 2021-07-24 VITALS — BP 137/71 | HR 71 | Temp 97.3°F | Resp 17 | Ht 66.0 in | Wt 223.8 lb

## 2021-07-24 VITALS — BP 121/78 | HR 66 | Temp 98.1°F | Resp 17 | Ht 65.0 in | Wt 227.3 lb

## 2021-07-24 DIAGNOSIS — R002 Palpitations: Secondary | ICD-10-CM

## 2021-07-24 DIAGNOSIS — Z01818 Encounter for other preprocedural examination: Secondary | ICD-10-CM

## 2021-07-24 DIAGNOSIS — Z01812 Encounter for preprocedural laboratory examination: Secondary | ICD-10-CM | POA: Diagnosis present

## 2021-07-24 DIAGNOSIS — I10 Essential (primary) hypertension: Secondary | ICD-10-CM

## 2021-07-24 DIAGNOSIS — K769 Liver disease, unspecified: Secondary | ICD-10-CM | POA: Diagnosis not present

## 2021-07-24 DIAGNOSIS — I499 Cardiac arrhythmia, unspecified: Secondary | ICD-10-CM

## 2021-07-24 LAB — COMPREHENSIVE METABOLIC PANEL
ALT: 15 U/L (ref 0–44)
AST: 25 U/L (ref 15–41)
Albumin: 3.8 g/dL (ref 3.5–5.0)
Alkaline Phosphatase: 47 U/L (ref 38–126)
Anion gap: 8 (ref 5–15)
BUN: 17 mg/dL (ref 8–23)
CO2: 25 mmol/L (ref 22–32)
Calcium: 9.5 mg/dL (ref 8.9–10.3)
Chloride: 105 mmol/L (ref 98–111)
Creatinine, Ser: 0.92 mg/dL (ref 0.44–1.00)
GFR, Estimated: >60 mL/min (ref >60)
Glucose, Bld: 126 mg/dL — ABNORMAL HIGH (ref 70–99)
Potassium: 3.9 mmol/L (ref 3.5–5.1)
Sodium: 138 mmol/L (ref 135–145)
Total Bilirubin: 1.8 mg/dL — ABNORMAL HIGH (ref 0.3–1.2)
Total Protein: 5.9 g/dL — ABNORMAL LOW (ref 6.5–8.1)

## 2021-07-24 LAB — CBC
HCT: 35.2 % — ABNORMAL LOW (ref 36.0–46.0)
Hemoglobin: 12.1 g/dL (ref 12.0–15.0)
MCH: 31.5 pg (ref 26.0–34.0)
MCHC: 34.4 g/dL (ref 30.0–36.0)
MCV: 91.7 fL (ref 80.0–100.0)
Platelets: 184 10*3/uL (ref 150–400)
RBC: 3.84 MIL/uL — ABNORMAL LOW (ref 3.87–5.11)
RDW: 13.1 % (ref 11.5–15.5)
WBC: 3.8 10*3/uL — ABNORMAL LOW (ref 4.0–10.5)
nRBC: 0 % (ref 0.0–0.2)

## 2021-07-24 LAB — SURGICAL PCR SCREEN
MRSA, PCR: NEGATIVE
Staphylococcus aureus: NEGATIVE

## 2021-07-24 LAB — TYPE AND SCREEN
ABO/RH(D): A POS
Antibody Screen: NEGATIVE

## 2021-07-24 NOTE — Progress Notes (Signed)
PCP - Candida Peeling, PA-C Cardiologist - Lawerance Cruel, PA-C  PPM/ICD - n/a  Chest x-ray - n/a EKG - 07/24/21 Stress Test - 09/25/13 ECHO - 07/12/20 Cardiac Cath - denies  Sleep Study - denies CPAP - denies  Blood Thinner Instructions: n/a Aspirin Instructions: Stopped ASA 1 week ago  NPO at MD  COVID TEST- n/a  Anesthesia review: Pt had a cardiologist appt today urgently d/t new onset palpitations and fatigue. Pt currently has a heart monitor on that she will wear for 3 days. No other cardiac testing suggested per cardiologist note but they will f/u after heart monitoring is completed. Myra Gianotti, PA-C with anesthesia has been informed and will follow-up with pt's chart.   Patient denies shortness of breath, fever, cough and chest pain at PAT appointment   All instructions explained to the patient, with a verbal understanding of the material. Patient agrees to go over the instructions while at home for a better understanding. Patient also instructed to self quarantine after being tested for COVID-19. The opportunity to ask questions was provided.

## 2021-07-24 NOTE — Progress Notes (Signed)
Primary Physician/Referring:  Candida Peeling, PA-C  [name], female    DOB: [birth year], 76 y.o.   MRN: [id number]  Chief Complaint  Patient presents with   Follow-up   Irregular Heart Beat    [name]  is a 76 y.o. female  with fibromyalgia, anxiety, depression, hyperlipidemia, hypertension.   Patient was last seen 08/25/2020 at which time she was stable from a cardiovascular standpoint and no changes were made.  She now presents for urgent visit at her request with concerns of palpitations over the last 1 week.  Patient reports episodes occurring multiple times per day when she feels palpitations lasting 15 to 20 minutes, worse when lying down.  She also has noticed occasional brief episodes of lightheadedness and shortness of breath with excessive exertion.  Notably physical activity has been limited over the last 2 weeks secondary to back pain.  Prior to this patient was walking regularly and doing weight training without issue.  She denies chest pain, syncope, near syncope.Denies leg edema, PND, orthopnea.  Notably patient is presently holding aspirin in preparation for upcoming back surgery which is scheduled for 08/02/2021.  Past Medical History:  Diagnosis Date   Anxiety    Arthritis    Depression    Fatty liver    Fibromyalgia    Gout 04/2017   Hyperlipidemia    Hypertension    Hypothyroidism        Low kidney function    followed by Dr Alyson Ingles   Spinal stenosis    Past Surgical History:  Procedure Laterality Date   Carpel Tunnel Surgery Right 10/17/2018   both wrists   CATARACT EXTRACTION W/ INTRAOCULAR LENS IMPLANT Bilateral 10/25/2015   Right eye was first.  Left eye done 10/17.   CERVICAL BIOPSY  W/ LOOP ELECTRODE EXCISION  12/2008   hx CIN II   COLONOSCOPY     LUMBAR FUSION     RADIOLOGY WITH ANESTHESIA N/A 07/26/2020   Procedure: MRI WITH ANESTHESIA  LUMBAR WITH AND WITHOUT CONTRAST;  Surgeon: Radiologist, Medication, MD;   Location: [place];  Service: Radiology;  Laterality: N/A;   Family History  Problem Relation Age of Onset   Osteoporosis Mother    Hypertension Mother    Uterine cancer Mother    Heart disease Mother        Atrial fibrillation   Breast cancer Sister 39   Osteoporosis Sister    Diabetes Sister    Diabetes Father    Heart disease Sister    Social History   Tobacco Use   Smoking status: Former    Packs/day: 0.25    Years: 5.00    Pack years: 1.25    Types: Cigarettes    Quit date: 1975    Years since quitting: 48.4   Smokeless tobacco: Never   Tobacco comments:    in the 44s  Substance Use Topics   Alcohol use: Yes    Comment: Rare  Marital Status: Married  ROS   Review of Systems  Constitutional: Negative for malaise/fatigue and weight gain.  Cardiovascular:  Positive for palpitations. Negative for chest pain, claudication, leg swelling, near-syncope, orthopnea, paroxysmal nocturnal dyspnea and syncope.  Respiratory:  Negative for shortness of breath.   Musculoskeletal:  Positive for back pain (chronic).  Neurological:  Negative for dizziness.     Objective  Blood pressure 137/71, pulse 71, temperature (!) 97.3 F (36.3 C), temperature source Temporal, resp. rate 17, height '5\' 6"'$  (  1.676 m), weight 223 lb 12.8 oz (101.5 kg), last menstrual period 03/05/2000, SpO2 97 %. Body mass index is 36.12 kg/m.   Physical Exam Vitals reviewed.  Cardiovascular:     Rate and Rhythm: Normal rate and regular rhythm.     Pulses: Intact distal pulses.          Carotid pulses are 2+ on the right side with bruit and 2+ on the left side.      Radial pulses are 2+ on the right side and 2+ on the left side.       Femoral pulses are 2+ on the right side and 2+ on the left side.      Popliteal pulses are 2+ on the right side and 2+ on the left side.       Dorsalis pedis pulses are 2+ on the right side and 2+ on the left side.       Posterior tibial pulses are 2+ on the right side and 2+ on  the left side.     Heart sounds: S1 normal and S2 normal. No murmur heard.   No gallop.  Pulmonary:     Effort: Pulmonary effort is normal. No respiratory distress.     Breath sounds: No wheezing, rhonchi or rales.  Musculoskeletal:     Right lower leg: Edema (trace) present.     Left lower leg: No edema.  Neurological:     Mental Status: She is alert.   Laboratory examination:      Latest Ref Rng & Units 06/12/2021    3:20 PM 07/26/2020    8:00 AM 07/07/2020   12:00 PM  CMP  Glucose 70 - 99 mg/dL 115   107   91    BUN 8 - 23 mg/dL '13   17   15    '$ Creatinine 0.44 - 1.00 mg/dL 0.93   1.01   0.99    Sodium 135 - 145 mmol/L 137   138   141    Potassium 3.5 - 5.1 mmol/L 3.6   3.7   4.6    Chloride 98 - 111 mmol/L 102   105   102    CO2 22 - 32 mmol/L '28   24   23    '$ Calcium 8.9 - 10.3 mg/dL 9.4   9.4   9.6        Latest Ref Rng & Units 06/12/2021    3:20 PM 06/27/2020    3:58 PM 01/16/2020    6:41 AM  CBC  WBC 4.0 - 10.5 K/uL 3.7   4.4   5.9    Hemoglobin 12.0 - 15.0 g/dL 13.0   12.0   9.7    Hematocrit 36.0 - 46.0 % 37.6   36.2   28.9    Platelets 150 - 400 K/uL 203   192   165     Lipid Panel     Component Value Date/Time   CHOL 121 10/22/2018 0815   TRIG 104 10/22/2018 0815   HDL 45 10/22/2018 0815   CHOLHDL 6.1 08/29/2013 0102   VLDL UNABLE TO CALCULATE IF TRIGLYCERIDE OVER 400 mg/dL 08/29/2013 0102   LDLCALC 55 10/22/2018 0815   LDLDIRECT 58 10/22/2018 0815   HEMOGLOBIN A1C No results found for: HGBA1C, MPG TSH No results for input(s): TSH in the last 8760 hours.  External labs:  10/07/2019: Glucose 101, sodium 140, potassium 4.2, BUN 24, creatinine 0.99, GFR 57 Hemoglobin 12.1, hematocrit 34.8, platelet 178, MCV 89.7  A1c 5.7% TSH 0.746  06/09/2018: Potassium 4.3, serum glucose 122 mg, BUN 18, creatinine 1.2, AST 36, AST 54 both elevated.  Total cholesterol 222, triglycerides 215, HDL 54, LDL 127.  Uric acid 8.4, elevated.  ANA negative.  Rheumatoid factor  negative.  BNP 06/27/2020: 118  Radiology :  No results found.  Chest x-ray 06/27/2020: The heart size and mediastinal contours are within normal limits. Both lungs are clear. The visualized skeletal structures are unremarkable.   IMPRESSION:No active cardiopulmonary disease  Cardiac Studies:  Stress Echo 09/25/2013: Patient exercised on Bruce protocol for 7:50 minutes and achieved 10.8 METS.  Achieved 107% of MPHR. - Stress echocardiogram with no chest pain nondiagnostic ST  changes; no stress-induced wall motion abnormalities; interpreted   as normal stress echocardiogram.  Lower extremity venous duplex 05/29/2018: No evidence of deep venous thrombosis. Left leg  Echocardiogram [facility] 06/17/2018: Normal LV systolic function, EF 60-04%.  Mild LVH.  No significant valvular abnormality.  [facility] Stress Test 08/06/2018: Nondiagnostic ECG stress due to pharmacologic stress protocol. Normal myocardial perfusion.  Stress LV EF: 72%.  Low risk study.  Venous ultrasound right leg 06/27/2020: Normal compressibility of the common femoral, superficial femoral, and popliteal veins, as well as the visualized calf veins. Limited visualization of the peroneal veins. Visualized portions of profunda femoral vein and great saphenous vein unremarkable. No filling defects to suggest DVT on grayscale or color Doppler imaging. Doppler waveforms show normal direction of venous flow, normal respiratory plasticity and response to augmentation.   Limited views of the contralateral common femoral vein are unremarkable.   Limitations: Limited visualization of the peroneal veins.   IMPRESSION: No evidence of DVT.  Echocardiogram 07/11/2020:  Left ventricle cavity is normal in size. Mild concentric hypertrophy of  the left ventricle. Normal global wall motion. Normal LV systolic function with EF 59%. Doppler evidence of grade I (impaired) diastolic dysfunction, normal LAP.  Mild  pulmonic regurgitation.  No evidence of pulmonary hypertension.  Carotid artery duplex 06/14/2021:  No evidence of significant stenosis in the right carotid vessels.  Duplex suggests stenosis in the left internal carotid artery (16-49%).  There is no significant plaque burden. This study may represent FMD (mild).  Antegrade right vertebral artery flow. Antegrade left vertebral artery flow.  No significant change 06/07/2020.  EKG  07/24/2021: Sinus rhythm at a rate of 69 bpm.  Left atrial enlargement.  Left axis, left anterior fascicular block.  Incomplete right bundle branch block.  Poor R wave progression, cannot exclude anteroseptal infarct old.  Nonspecific T wave abnormality. Compared to EKG 05/25/2020, no significant change.  EKG 05/25/2020: Sinus rhythm at a rate of 75 bpm with single PAC.  Left atrial enlargement.  Left axis, left anterior fascicular block.  Incomplete right bundle branch block.  Nonspecific T wave abnormality.  Compared to EKG 07/17/2018, no significant change.  EKG 07/17/2018: Normal sinus rhythm at rate of 75 bpm, left atrial enlargement, normal axis.  Incomplete right bundle branch block.  No evidence of ischemia.  Assessment     ICD-10-CM   1. Essential hypertension  I10 EKG 12-Lead    2. Irregular heartbeat  I49.9     3. Palpitations  R00.2 LONG TERM MONITOR (3-14 DAYS)      No orders of the defined types were placed in this encounter.  There are no discontinued medications.  Recommendations:   Ms. Alesa Echevarria is a pleasant 76 y.o.Caucasian femalewith fibromyalgia, anxiety, depression, hyperlipidemia, hypertension.   Patient was last  seen 08/25/2020 at which time she was stable from a cardiovascular standpoint and no changes were made.  She now presents for urgent visit at her request with concerns of palpitations over the last 1 week.  Given patient's new onset of palpitations will obtain 3-day cardiac monitor to evaluate for underlying  arrhythmias.  Patient discussed results of carotid artery duplex, details above.  Plan to repeat carotid artery surveillance in 1 year.  Since blood pressure is well controlled.  His most recent echocardiogram was approximately 1 year ago with normal LVEF and grade 1 diastolic dysfunction.  There is no clinical evidence of acute decompensated heart failure at today's office visit, she is euvolemic on exam.  Also reviewed stress test from 2020 which was overall low risk.  Shared decision was to hold off on repeat echocardiogram or stress test unless indicated by abnormalities on monitor.  Suspect deconditioning is contributing to patient's feelings of fatigue as well as shortness of breath with exertion.  Further recommendations pending results of cardiac monitor.    Alethia Berthold, PA-C 07/24/2021, 1:03 PM Office: [phone]  CC: Dr. Julien Girt. Saintclair Halsted, MD

## 2021-08-01 ENCOUNTER — Encounter: Payer: Self-pay | Admitting: Student

## 2021-08-02 ENCOUNTER — Encounter (HOSPITAL_COMMUNITY): Admission: RE | Payer: Self-pay | Source: Home / Self Care

## 2021-08-02 ENCOUNTER — Ambulatory Visit (HOSPITAL_COMMUNITY): Admission: RE | Admit: 2021-08-02 | Payer: Medicare Other | Source: Home / Self Care | Admitting: Neurosurgery

## 2021-08-02 SURGERY — POSTERIOR LUMBAR FUSION 1 LEVEL
Anesthesia: General | Site: Back

## 2021-08-03 ENCOUNTER — Telehealth: Payer: Self-pay

## 2021-08-03 ENCOUNTER — Encounter: Payer: Self-pay | Admitting: Student

## 2021-08-03 NOTE — Telephone Encounter (Signed)
Pt called asking for her monitor result due to her having a surgery soon. Would you be able to result it please

## 2021-08-18 ENCOUNTER — Other Ambulatory Visit: Payer: Self-pay | Admitting: Cardiology

## 2021-08-18 DIAGNOSIS — Z1231 Encounter for screening mammogram for malignant neoplasm of breast: Secondary | ICD-10-CM

## 2021-08-22 ENCOUNTER — Ambulatory Visit
Admission: RE | Admit: 2021-08-22 | Discharge: 2021-08-22 | Disposition: A | Discharge status: Home / Self Care | Payer: Medicare Other | Source: Ambulatory Visit | Attending: Cardiology | Admitting: Cardiology

## 2021-08-22 ENCOUNTER — Other Ambulatory Visit: Payer: Self-pay | Admitting: Obstetrics & Gynecology

## 2021-08-22 DIAGNOSIS — Z1231 Encounter for screening mammogram for malignant neoplasm of breast: Secondary | ICD-10-CM

## 2021-08-29 ENCOUNTER — Ambulatory Visit: Payer: Medicare Other | Admitting: Student

## 2021-09-04 ENCOUNTER — Other Ambulatory Visit: Payer: Self-pay | Admitting: Pain Medicine

## 2021-09-04 DIAGNOSIS — Z780 Asymptomatic menopausal state: Secondary | ICD-10-CM

## 2021-09-08 NOTE — Progress Notes (Deleted)
Primary Physician/Referring:  Candida Peeling, PA-C  [name], female    DOB: April 26, 1945, 76 y.o.   MRN: [id number]  No chief complaint on file.   [name]  is a 76 y.o. female  with fibromyalgia, anxiety, depression, hyperlipidemia, hypertension.   She was last seen in the office 07/24/2021 with concerns of palpitations, therefore ordered cardiac monitor which revealed overall less than 1% A-fib burden with the longest atrial fibrillation episode lasting <2 minutes.  Patient is otherwise stable from a cardiovascular standpoint.  She now presents for 8-week follow-up. ***  ***  Patient was last seen 08/25/2020 at which time she was stable from a cardiovascular standpoint and no changes were made.  She now presents for urgent visit at her request with concerns of palpitations over the last 1 week.  Patient reports episodes occurring multiple times per day when she feels palpitations lasting 15 to 20 minutes, worse when lying down.  She also has noticed occasional brief episodes of lightheadedness and shortness of breath with excessive exertion.  Notably physical activity has been limited over the last 2 weeks secondary to back pain.  Prior to this patient was walking regularly and doing weight training without issue.  She denies chest pain, syncope, near syncope.Denies leg edema, PND, orthopnea.  Notably patient is presently holding aspirin in preparation for upcoming back surgery which is scheduled for 08/02/2021.  Past Medical History:  Diagnosis Date   Anxiety    Arthritis    Depression    Fatty liver    Fibromyalgia    Gout 04/2017   Hyperlipidemia    Hypertension    Hypothyroidism        Low kidney function    followed by Dr Alyson Ingles   Spinal stenosis    Past Surgical History:  Procedure Laterality Date   Carpel Tunnel Surgery Right 10/17/2018   both wrists   CATARACT EXTRACTION W/ INTRAOCULAR LENS IMPLANT Bilateral 10/25/2015   Right eye  was first.  Left eye done 10/17.   CERVICAL BIOPSY  W/ LOOP ELECTRODE EXCISION  12/2008   hx CIN II   COLONOSCOPY     LUMBAR FUSION     RADIOLOGY WITH ANESTHESIA N/A 07/26/2020   Procedure: MRI WITH ANESTHESIA  LUMBAR WITH AND WITHOUT CONTRAST;  Surgeon: Radiologist, Medication, MD;  Location: [facility];  Service: Radiology;  Laterality: N/A;   Family History  Problem Relation Age of Onset   Osteoporosis Mother    Hypertension Mother    Uterine cancer Mother    Heart disease Mother        Atrial fibrillation   Breast cancer Sister 26   Osteoporosis Sister    Diabetes Sister    Diabetes Father    Heart disease Sister    Social History   Tobacco Use   Smoking status: Former    Packs/day: 0.25    Years: 5.00    Total pack years: 1.25    Types: Cigarettes    Quit date: 1975    Years since quitting: 48.5   Smokeless tobacco: Never   Tobacco comments:    in the 40s  Substance Use Topics   Alcohol use: Yes    Comment: Rare  Marital Status: Married  ROS   Review of Systems  Constitutional: Negative for malaise/fatigue and weight gain.  Cardiovascular:  Positive for palpitations. Negative for chest pain, claudication, leg swelling, near-syncope, orthopnea, paroxysmal nocturnal dyspnea and syncope.  Respiratory:  Negative for  shortness of breath.   Musculoskeletal:  Positive for back pain (chronic).  Neurological:  Negative for dizziness.      Objective  Last menstrual period 03/05/2000. There is no height or weight on file to calculate BMI.   Physical Exam Vitals reviewed.  Cardiovascular:     Rate and Rhythm: Normal rate and regular rhythm.     Pulses: Intact distal pulses.          Carotid pulses are 2+ on the right side with bruit and 2+ on the left side.      Radial pulses are 2+ on the right side and 2+ on the left side.       Femoral pulses are 2+ on the right side and 2+ on the left side.      Popliteal pulses are 2+ on the right side and 2+ on the left side.        Dorsalis pedis pulses are 2+ on the right side and 2+ on the left side.       Posterior tibial pulses are 2+ on the right side and 2+ on the left side.     Heart sounds: S1 normal and S2 normal. No murmur heard.    No gallop.  Pulmonary:     Effort: Pulmonary effort is normal. No respiratory distress.     Breath sounds: No wheezing, rhonchi or rales.  Musculoskeletal:     Right lower leg: Edema (trace) present.     Left lower leg: No edema.  Neurological:     Mental Status: She is alert.    Laboratory examination:      Latest Ref Rng & Units 07/24/2021    2:40 PM 06/12/2021    3:20 PM 07/26/2020    8:00 AM  CMP  Glucose 70 - 99 mg/dL 126  115  107   BUN 8 - 23 mg/dL '17  13  17   '$ Creatinine 0.44 - 1.00 mg/dL 0.92  0.93  1.01   Sodium 135 - 145 mmol/L 138  137  138   Potassium 3.5 - 5.1 mmol/L 3.9  3.6  3.7   Chloride 98 - 111 mmol/L 105  102  105   CO2 22 - 32 mmol/L '25  28  24   '$ Calcium 8.9 - 10.3 mg/dL 9.5  9.4  9.4   Total Protein 6.5 - 8.1 g/dL 5.9     Total Bilirubin 0.3 - 1.2 mg/dL 1.8     Alkaline Phos 38 - 126 U/L 47     AST 15 - 41 U/L 25     ALT 0 - 44 U/L 15         Latest Ref Rng & Units 07/24/2021    2:40 PM 06/12/2021    3:20 PM 06/27/2020    3:58 PM  CBC  WBC 4.0 - 10.5 K/uL 3.8  3.7  4.4   Hemoglobin 12.0 - 15.0 g/dL 12.1  13.0  12.0   Hematocrit 36.0 - 46.0 % 35.2  37.6  36.2   Platelets 150 - 400 K/uL 184  203  192    Lipid Panel     Component Value Date/Time   CHOL 121 10/22/2018 0815   TRIG 104 10/22/2018 0815   HDL 45 10/22/2018 0815   CHOLHDL 6.1 08/29/2013 0102   VLDL UNABLE TO CALCULATE IF TRIGLYCERIDE OVER 400 mg/dL 08/29/2013 0102   LDLCALC 55 10/22/2018 0815   LDLDIRECT 58 10/22/2018 0815   HEMOGLOBIN A1C No results found for: "  HGBA1C", "MPG" TSH No results for input(s): "TSH" in the last 8760 hours.  External labs:  10/07/2019: Glucose 101, sodium 140, potassium 4.2, BUN 24, creatinine 0.99, GFR 57 Hemoglobin 12.1, hematocrit  34.8, platelet 178, MCV 89.7 A1c 5.7% TSH 0.746  06/09/2018: Potassium 4.3, serum glucose 122 mg, BUN 18, creatinine 1.2, AST 36, AST 54 both elevated.  Total cholesterol 222, triglycerides 215, HDL 54, LDL 127.  Uric acid 8.4, elevated.  ANA negative.  Rheumatoid factor negative.  BNP 06/27/2020: 118  Radiology :  No results found.  Chest x-ray 06/27/2020: The heart size and mediastinal contours are within normal limits. Both lungs are clear. The visualized skeletal structures are unremarkable.   IMPRESSION:No active cardiopulmonary disease  Cardiac Studies:  Stress Echo 09/25/2013: Patient exercised on Bruce protocol for 7:50 minutes and achieved 10.8 METS.  Achieved 107% of MPHR. - Stress echocardiogram with no chest pain nondiagnostic ST  changes; no stress-induced wall motion abnormalities; interpreted   as normal stress echocardiogram.  Lower extremity venous duplex 05/29/2018: No evidence of deep venous thrombosis. Left leg  Echocardiogram [facility] 06/17/2018: Normal LV systolic function, EF 13-08%.  Mild LVH.  No significant valvular abnormality.  [facility] Stress Test 08/06/2018: Nondiagnostic ECG stress due to pharmacologic stress protocol. Normal myocardial perfusion.  Stress LV EF: 72%.  Low risk study.  Venous ultrasound right leg 06/27/2020: Normal compressibility of the common femoral, superficial femoral, and popliteal veins, as well as the visualized calf veins. Limited visualization of the peroneal veins. Visualized portions of profunda femoral vein and great saphenous vein unremarkable. No filling defects to suggest DVT on grayscale or color Doppler imaging. Doppler waveforms show normal direction of venous flow, normal respiratory plasticity and response to augmentation.   Limited views of the contralateral common femoral vein are unremarkable.   Limitations: Limited visualization of the peroneal veins.   IMPRESSION: No evidence of  DVT.  Echocardiogram 07/11/2020:  Left ventricle cavity is normal in size. Mild concentric hypertrophy of  the left ventricle. Normal global wall motion. Normal LV systolic function with EF 59%. Doppler evidence of grade I (impaired) diastolic dysfunction, normal LAP.  Mild pulmonic regurgitation.  No evidence of pulmonary hypertension.  Carotid artery duplex 06/14/2021:  No evidence of significant stenosis in the right carotid vessels.  Duplex suggests stenosis in the left internal carotid artery (16-49%).  There is no significant plaque burden. This study may represent FMD (mild).  Antegrade right vertebral artery flow. Antegrade left vertebral artery flow.  No significant change 06/07/2020.  Ambulatory cardiac telemetry 3 days (07/24/2021 - 07/27/2021): Predominant underlying rhythm was sinus with first-degree AV block and IVCD.  Patient with occasional PACs and occasional PVCs which correlated with patient triggered events.  Patient did have newly noted episodes of atrial fibrillation with the longest lasting <2 minutes.  Overall A-fib burden <1%.  Minimum heart rate 47 bpm, maximum heart rate 127 bpm, average heart rate 63 bpm.   EKG  07/24/2021: Sinus rhythm at a rate of 69 bpm.  Left atrial enlargement.  Left axis, left anterior fascicular block.  Incomplete right bundle branch block.  Poor R wave progression, cannot exclude anteroseptal infarct old.  Nonspecific T wave abnormality. Compared to EKG 05/25/2020, no significant change.  EKG 05/25/2020: Sinus rhythm at a rate of 75 bpm with single PAC.  Left atrial enlargement.  Left axis, left anterior fascicular block.  Incomplete right bundle branch block.  Nonspecific T wave abnormality.  Compared to EKG 07/17/2018, no significant  change.  EKG 07/17/2018: Normal sinus rhythm at rate of 75 bpm, left atrial enlargement, normal axis.  Incomplete right bundle branch block.  No evidence of ischemia.  Assessment   No diagnosis found.   No orders  of the defined types were placed in this encounter.  There are no discontinued medications.  Recommendations:   Ms. Annette Bertelson is a pleasant 76 y.o.Caucasian femalewith fibromyalgia, anxiety, depression, hyperlipidemia, hypertension.   She was last seen in the office 07/24/2021 with concerns of palpitations, therefore ordered cardiac monitor which revealed overall less than 1% A-fib burden with the longest atrial fibrillation episode lasting <2 minutes.  Patient is otherwise stable from a cardiovascular standpoint.  She now presents for 8-week follow-up. ***  ***  Patient was last seen 08/25/2020 at which time she was stable from a cardiovascular standpoint and no changes were made.  She now presents for urgent visit at her request with concerns of palpitations over the last 1 week.  Given patient's new onset of palpitations will obtain 3-day cardiac monitor to evaluate for underlying arrhythmias.  Patient discussed results of carotid artery duplex, details above.  Plan to repeat carotid artery surveillance in 1 year.  Since blood pressure is well controlled.  His most recent echocardiogram was approximately 1 year ago with normal LVEF and grade 1 diastolic dysfunction.  There is no clinical evidence of acute decompensated heart failure at today's office visit, she is euvolemic on exam.  Also reviewed stress test from 2020 which was overall low risk.  Shared decision was to hold off on repeat echocardiogram or stress test unless indicated by abnormalities on monitor.  Suspect deconditioning is contributing to patient's feelings of fatigue as well as shortness of breath with exertion.  Further recommendations pending results of cardiac monitor.    Alethia Berthold, PA-C 09/08/2021, 3:10 PM Office: [phone]  CC: Dr. Julien Girt. Saintclair Halsted, MD

## 2021-09-10 NOTE — Pre-Procedure Instructions (Signed)
Surgical Instructions                 Your procedure is scheduled on September 13, 2021             Report to [facility] Main Entrance "A" at 7:45 A.M., then check in with the Admitting office.             Call this number if you have problems the morning of surgery:             [phone]    If you have any questions prior to your surgery date call [phone]: Open Monday-Friday 8am-4pm                 Remember:             Do not eat or drink after midnight the night before your surgery                          Take these medicines the morning of surgery with A SIP OF WATER:    Metoprolol succinate (TOPROL-XL) Levothyroxine (SYNTHROID) Allopurinol (ZYLOPRIM) Pregabalin (LYRICA) Rosuvastatin (CRESTOR)  AmLODipine (NORVASC)  BusPIRone (BUSPAR)   If needed: Hydrocodone-acetaminophen ([place])   Follow your surgeon's instructions on when to stop Aspirin.  If no instructions were given by your surgeon then you will need to call the office to get those instructions.     As of today, STOP taking any Aleve, Naproxen, Ibuprofen, Motrin, Advil, Goody's, BC's, all herbal medications, fish oil, and all vitamins.            Do not wear jewelry or makeup Do not wear lotions, powders, perfumes or deodorant. Do not shave 48 hours prior to surgery.   Do not bring valuables to the hospital. Do not wear nail polish, gel polish, artificial nails, or any other type of covering on natural nails (fingers and toes) If you have artificial nails or gel coating that need to be removed by a nail salon, please have this removed prior to surgery. Artificial nails or gel coating may interfere with anesthesia's ability to adequately monitor your vital signs.   [facility] is not responsible for any belongings or valuables. .    Do NOT Smoke (Tobacco/Vaping)  24 hours prior to your procedure   If you use a CPAP at night, you may bring your mask for your overnight stay.   Contacts, glasses, hearing aids,  dentures or partials may not be worn into surgery, please bring cases for these belongings   For patients admitted to the hospital, discharge time will be determined by your treatment team.   Patients discharged the day of surgery will not be allowed to drive home, and someone needs to stay with them for 24 hours.     SURGICAL WAITING ROOM VISITATION Patients having surgery or a procedure in a hospital may have two support people. Children under the age of 27 must have an adult with them who is not the patient. They may stay in the waiting area during the procedure and may switch out with other visitors. If the patient needs to stay at the hospital during part of their recovery, the visitor guidelines for inpatient rooms apply.   Please refer to the [facility] website for the visitor guidelines for Inpatients (after your surgery is over and you are in a regular room).            Special instructions:  Oral Hygiene is also important to reduce your risk of infection.  Remember - BRUSH YOUR TEETH THE MORNING OF SURGERY WITH YOUR REGULAR TOOTHPASTE     [facility]- Preparing For Surgery   Before surgery, you can play an important role. Because skin is not sterile, your skin needs to be as free of germs as possible. You can reduce the number of germs on your skin by washing with CHG (chlorahexidine gluconate) Soap before surgery.  CHG is an antiseptic cleaner which kills germs and bonds with the skin to continue killing germs even after washing.       Please do not use if you have an allergy to CHG or antibacterial soaps. If your skin becomes reddened/irritated stop using the CHG.  Do not shave (including legs and underarms) for at least 48 hours prior to first CHG shower. It is OK to shave your face.   Please follow these instructions carefully.                                                                                                                                  Shower the  NIGHT BEFORE SURGERY and the MORNING OF SURGERY with CHG Soap.  If you chose to wash your hair, wash your hair first as usual with your normal shampoo. After you shampoo, rinse your hair and body thoroughly to remove the shampoo.  Then [employer] and genitals (private parts) with your normal soap and rinse thoroughly to remove soap.   After that Use CHG Soap as you would any other liquid soap. You can apply CHG directly to the skin and wash gently with a scrungie or a clean washcloth.    Apply the CHG Soap to your body ONLY FROM THE NECK DOWN.  Do not use on open wounds or open sores. Avoid contact with your eyes, ears, mouth and genitals (private parts). Wash Face and genitals (private parts)  with your normal soap.    Wash thoroughly, paying special attention to the area where your surgery will be performed.   Thoroughly rinse your body with warm water from the neck down.   DO NOT shower/wash with your normal soap after using and rinsing off the CHG Soap.   Pat yourself dry with a CLEAN TOWEL.   Wear CLEAN PAJAMAS to bed the night before surgery   Place CLEAN SHEETS on your bed the night before your surgery   DO NOT SLEEP WITH PETS.     Day of Surgery:   Take a shower with CHG soap. Wear Clean/Comfortable clothing the morning of surgery Do not apply any deodorants/lotions.   Remember to brush your teeth WITH YOUR REGULAR TOOTHPASTE.       If you received a COVID test during your pre-op visit, it is requested that you wear a mask when out in public, stay away from anyone that may not be feeling well, and notify your surgeon if  you develop symptoms. If you have been in contact with anyone that has tested positive in the last 10 days, please notify your surgeon.     Please read over the following fact sheets that you were given.

## 2021-09-11 ENCOUNTER — Other Ambulatory Visit: Payer: Self-pay

## 2021-09-11 ENCOUNTER — Other Ambulatory Visit: Payer: Self-pay | Admitting: Neurosurgery

## 2021-09-11 ENCOUNTER — Encounter (HOSPITAL_COMMUNITY)
Admission: RE | Admit: 2021-09-11 | Discharge: 2021-09-11 | Disposition: A | Discharge status: Home / Self Care | Payer: Medicare Other | Source: Ambulatory Visit | Attending: Neurosurgery | Admitting: Neurosurgery

## 2021-09-11 VITALS — BP 143/72 | HR 65 | Temp 97.5°F | Resp 17 | Ht 66.0 in | Wt 216.7 lb

## 2021-09-11 DIAGNOSIS — R002 Palpitations: Secondary | ICD-10-CM | POA: Insufficient documentation

## 2021-09-11 DIAGNOSIS — E785 Hyperlipidemia, unspecified: Secondary | ICD-10-CM | POA: Insufficient documentation

## 2021-09-11 DIAGNOSIS — I493 Ventricular premature depolarization: Secondary | ICD-10-CM | POA: Diagnosis not present

## 2021-09-11 DIAGNOSIS — Z01818 Encounter for other preprocedural examination: Secondary | ICD-10-CM

## 2021-09-11 DIAGNOSIS — I4891 Unspecified atrial fibrillation: Secondary | ICD-10-CM | POA: Diagnosis not present

## 2021-09-11 DIAGNOSIS — I491 Atrial premature depolarization: Secondary | ICD-10-CM | POA: Diagnosis not present

## 2021-09-11 DIAGNOSIS — Z01812 Encounter for preprocedural laboratory examination: Secondary | ICD-10-CM | POA: Diagnosis present

## 2021-09-11 DIAGNOSIS — I440 Atrioventricular block, first degree: Secondary | ICD-10-CM | POA: Diagnosis not present

## 2021-09-11 DIAGNOSIS — I10 Essential (primary) hypertension: Secondary | ICD-10-CM | POA: Diagnosis not present

## 2021-09-11 LAB — BASIC METABOLIC PANEL
Anion gap: 12 (ref 5–15)
BUN: 12 mg/dL (ref 8–23)
CO2: 25 mmol/L (ref 22–32)
Calcium: 9.9 mg/dL (ref 8.9–10.3)
Chloride: 102 mmol/L (ref 98–111)
Creatinine, Ser: 0.85 mg/dL (ref 0.44–1.00)
GFR, Estimated: >60 mL/min (ref >60)
Glucose, Bld: 139 mg/dL — ABNORMAL HIGH (ref 70–99)
Potassium: 3.5 mmol/L (ref 3.5–5.1)
Sodium: 139 mmol/L (ref 135–145)

## 2021-09-11 LAB — CBC
HCT: 36 % (ref 36.0–46.0)
Hemoglobin: 12.7 g/dL (ref 12.0–15.0)
MCH: 31.2 pg (ref 26.0–34.0)
MCHC: 35.3 g/dL (ref 30.0–36.0)
MCV: 88.5 fL (ref 80.0–100.0)
Platelets: 194 10*3/uL (ref 150–400)
RBC: 4.07 MIL/uL (ref 3.87–5.11)
RDW: 12.6 % (ref 11.5–15.5)
WBC: 3.8 10*3/uL — ABNORMAL LOW (ref 4.0–10.5)
nRBC: 0 % (ref 0.0–0.2)

## 2021-09-11 LAB — SURGICAL PCR SCREEN
MRSA, PCR: NEGATIVE
Staphylococcus aureus: NEGATIVE

## 2021-09-11 LAB — TYPE AND SCREEN
ABO/RH(D): A POS
Antibody Screen: NEGATIVE

## 2021-09-11 NOTE — Progress Notes (Signed)
PCP - Candida Peeling PA-C Cardiologist - Lawerance Cruel PA-C  PPM/ICD -  Device Orders -  Rep Notified -   Chest x-ray - na EKG - 07/24/21 Stress Test - 08/06/18 ECHO - 07/11/21 Cardiac Cath - denies  Sleep Study - denies CPAP -   Fasting Blood Sugar - na Checks Blood Sugar _____ times a day  Blood Thinner Instructions:na Aspirin Instructions:Pt stated she stopped Aspirin 2-3 weeks ago  ERAS Protcol -no PRE-SURGERY Ensure or G2-   COVID TEST- na   Anesthesia review: yes-cardiac history  Patient denies shortness of breath, fever, cough and chest pain at PAT appointment   All instructions explained to the patient, with a verbal understanding of the material. Patient agrees to go over the instructions while at home for a better understanding. Patient also instructed to wear a mask when out in public prior to surgery. The opportunity to ask questions was provided.

## 2021-09-12 NOTE — Progress Notes (Signed)
Anesthesia Chart Review:  Follows with [facility] cardiology for history of hypertension, hyperlipidemia, bilateral leg edema, mild carotid stenosis.  Last seen by Lawerance Cruel, PA-C 07/24/21 and at that time complained of increased palpitations.  She denied chest pain, syncope, near syncope.  Per note, "Patient was last seen 08/25/2020 at which time she was stable from a cardiovascular standpoint and no changes were made.  She now presents for urgent visit at her request with concerns of palpitations over the last 1 week.  Given patient's new onset of palpitations will obtain 3-day cardiac monitor to evaluate for underlying arrhythmias. Patient discussed results of carotid artery duplex, details above.  Plan to repeat carotid artery surveillance in 1 year.  Since blood pressure is well controlled.  His most recent echocardiogram was approximately 1 year ago with normal LVEF and grade 1 diastolic dysfunction.  There is no clinical evidence of acute decompensated heart failure at today's office visit, she is euvolemic on exam.  Also reviewed stress test from 2020 which was overall low risk.  Shared decision was to hold off on repeat echocardiogram or stress test unless indicated by abnormalities on monitor. Suspect deconditioning is contributing to patient's feelings of fatigue as well as shortness of breath with exertion."  3-day event monitor 5/22 through 07/27/2021 showed predominant underlying rhythm was sinus with first-degree AV block and IVCD.  Patient had occasional PACs and PVCs which correlated with triggered events.  There were newly noted episodes of A-fib with the longest lasting <2 minutes.  Overall A-fib burden <1%.  Based on these findings, patient was cleared for surgery.  Per letter dated 08/03/2021, "Mahari Vankirk is at low risk, from a cardiac standpoint, for her upcoming procedure: spinal fusion.  It is ok to proceed without further cardiac testing. No evidence of significant arrhythmias  on recent cardiac monitor. If applicable can hold aspirin for 7  day(s) prior to procedure and re-start 1  days post procedure."  Preop labs reviewed, unremarkable.   EKG 07/24/2021: Sinus rhythm.  Rate 69. Nonspecific T-abnormality.  Low voltage with rightward P-axis and rotation -possible pulmonary disease.    Ambulatory cardiac telemetry 3 days (07/24/2021 - 07/27/2021): Predominant underlying rhythm was sinus with first-degree AV block and IVCD.  Patient with occasional PACs and occasional PVCs which correlated with patient triggered events.  Patient did have newly noted episodes of atrial fibrillation with the longest lasting <2 minutes.  Overall A-fib burden <1%.  Minimum heart rate 47 bpm, maximum heart rate 127 bpm, average heart rate 63 bpm.   Carotid artery duplex 06/14/2021:  No evidence of significant stenosis in the right carotid vessels.  Duplex suggests stenosis in the left internal carotid artery (16-49%).  There is no significant plaque burden. This study may represent FMD (mild).  Antegrade right vertebral artery flow. Antegrade left vertebral artery flow.  No significant change 06/07/2020.   Echocardiogram 07/11/2020:  Left ventricle cavity is normal in size. Mild concentric hypertrophy of  the left ventricle. Normal global wall motion. Normal LV systolic function with EF 59%. Doppler evidence of grade I (impaired) diastolic dysfunction, normal LAP.  Mild pulmonic regurgitation.  No evidence of pulmonary hypertension.   [facility] Stress Test 08/06/2018: Nondiagnostic ECG stress due to pharmacologic stress protocol. Normal myocardial perfusion.  Stress LV EF: 72%.  Low risk study.    Wynonia Musty [facility] Short Stay Center/Anesthesiology Phone [phone] 09/12/2021 10:01 AM

## 2021-09-12 NOTE — Anesthesia Preprocedure Evaluation (Addendum)
Anesthesia Evaluation  Patient identified by MRN, date of birth, ID band Patient awake    Reviewed: Allergy & Precautions, H&P , NPO status , Patient's Chart, lab work & pertinent test results, reviewed documented beta blocker date and time   Airway Mallampati: II  TM Distance: >3 FB Neck ROM: Full    Dental no notable dental hx. (+) Teeth Intact, Dental Advisory Given   Pulmonary neg pulmonary ROS, former smoker,    Pulmonary exam normal breath sounds clear to auscultation       Cardiovascular Exercise Tolerance: Good hypertension, Pt. on medications and Pt. on home beta blockers  Rhythm:Regular Rate:Normal     Neuro/Psych Anxiety Depression negative neurological ROS     GI/Hepatic negative GI ROS, Neg liver ROS,   Endo/Other  Hypothyroidism Morbid obesity  Renal/GU Renal disease  negative genitourinary   Musculoskeletal  (+) Arthritis , Osteoarthritis,  Fibromyalgia -  Abdominal   Peds  Hematology negative hematology ROS (+)   Anesthesia Other Findings   Reproductive/Obstetrics negative OB ROS                          Anesthesia Physical Anesthesia Plan  ASA: 3  Anesthesia Plan: General   Post-op Pain Management: Tylenol PO (pre-op)*, Ketamine IV* and Precedex   Induction: Intravenous  PONV Risk Score and Plan: 4 or greater and Dexamethasone, Treatment may vary due to age or medical condition and Midazolam  Airway Management Planned: Oral ETT  Additional Equipment:   Intra-op Plan:   Post-operative Plan: Extubation in OR  Informed Consent: I have reviewed the patients History and Physical, chart, labs and discussed the procedure including the risks, benefits and alternatives for the proposed anesthesia with the patient or authorized representative who has indicated his/her understanding and acceptance.     Dental advisory given  Plan Discussed with: CRNA  Anesthesia  Plan Comments: (PAT note by Patricia Caldwell, PA-C: Follows with [facility] cardiology for history of hypertension, hyperlipidemia, bilateral leg edema, mild carotid stenosis. Last seen by Patricia Cruel, PA-C 07/24/21 and at that time complained of increased palpitations.  She denied chest pain, syncope, near syncope.  Per note, "Patient was last seen 08/25/2020 at which time she was stable from a cardiovascular standpoint and no changes were made. She now presents for urgent visit at her request with concerns of palpitations over the last 1 week.Given patient's new onset of palpitations will obtain 3-day cardiac monitor to evaluate for underlying arrhythmias. Patient discussed results of carotid artery duplex, details above.Plan to repeat carotid artery surveillance in 1 year. Since blood pressure is well controlled. His most recent echocardiogram was approximately 1 year ago with normal LVEF and grade 1 diastolic dysfunction. There is no clinical evidence of acute decompensated heart failure at today's office visit, she is euvolemic on exam. Also reviewed stress test from 2020 which was overall low risk. Shared decision was to hold off on repeat echocardiogram or stress test unless indicated by abnormalities on monitor. Suspect deconditioning is contributing to patient's feelings of fatigue as well as shortness of breath with exertion."  3-day event monitor 5/22 through 07/27/2021 showed predominant underlying rhythm was sinus with first-degree AV block and IVCD.  Patient had occasional PACs and PVCs which correlated with triggered events.  There were newly noted episodes of A-fib with the longest lasting <2 minutes.  Overall A-fib burden <1%.  Based on these findings, patient was cleared for surgery.  Per letter dated 08/03/2021, "Patricia Parks  Patricia Parks at low risk, from a cardiac standpoint, for herupcoming procedure: spinal fusion. It is ok to proceed without further cardiac testing. No evidence of  significant arrhythmias on recent cardiac monitor. If applicable can hold aspirinfor 7day(s) prior to procedure and re-start 1 days post procedure."  Preop labs reviewed, unremarkable.  EKG 07/24/2021:Sinus rhythm.  Rate 69. Nonspecific T-abnormality.  Low voltage with rightward P-axis and rotation -possible pulmonary disease.   Ambulatory cardiac telemetry 3 days (07/24/2021 - 07/27/2021): Predominant underlying rhythm was sinus with first-degree AV block and IVCD. Patient with occasional PACs and occasional PVCs which correlated with patient triggered events. Patient did have newly noted episodes of atrial fibrillation with the longest lasting <2 minutes. Overall A-fib burden <1%. Minimum heart rate 47 bpm, maximum heart rate 127 bpm, average heart rate 63 bpm.   Carotid artery duplex 06/14/2021:  No evidence of significant stenosis in the right carotid vessels.  Duplex suggests stenosis in the left internal carotid artery (16-49%).  There is no significant plaque burden. This study may represent FMD (mild).  Antegrade right vertebral artery flow. Antegrade left vertebral artery flow.  No significant change 06/07/2020.  Echocardiogram 07/11/2020:  Left ventricle cavity is normal in size. Mild concentric hypertrophy of the left ventricle. Normal global wall motion. Normal LV systolic function with EF 59%. Doppler evidence of grade I (impaired) diastolic dysfunction, normal LAP.  Mild pulmonic regurgitation.  No evidence of pulmonary hypertension.  Marland KitchenLexiscan Myoview Stress Test06/05/2018: Nondiagnostic ECG stress due to pharmacologic stress protocol. Normal myocardial perfusion. Stress LV EF: 72%. Low risk study.   )      Anesthesia Quick Evaluation

## 2021-09-13 ENCOUNTER — Ambulatory Visit (HOSPITAL_COMMUNITY)
Admission: RE | Admit: 2021-09-13 | Discharge: 2021-09-14 | Disposition: A | Discharge status: Home / Self Care | Payer: Medicare Other | Attending: Neurosurgery | Admitting: Neurosurgery

## 2021-09-13 ENCOUNTER — Other Ambulatory Visit: Payer: Self-pay

## 2021-09-13 ENCOUNTER — Ambulatory Visit (HOSPITAL_BASED_OUTPATIENT_CLINIC_OR_DEPARTMENT_OTHER): Payer: Medicare Other | Admitting: Certified Registered"

## 2021-09-13 ENCOUNTER — Encounter (HOSPITAL_COMMUNITY): Payer: Self-pay | Admitting: Neurosurgery

## 2021-09-13 ENCOUNTER — Ambulatory Visit (HOSPITAL_COMMUNITY): Payer: Medicare Other

## 2021-09-13 ENCOUNTER — Encounter (HOSPITAL_COMMUNITY)
Admission: RE | Disposition: A | Discharge status: Home / Self Care | Payer: Self-pay | Source: Home / Self Care | Attending: Neurosurgery

## 2021-09-13 ENCOUNTER — Ambulatory Visit (HOSPITAL_COMMUNITY): Payer: Medicare Other | Admitting: Physician Assistant

## 2021-09-13 DIAGNOSIS — Z87891 Personal history of nicotine dependence: Secondary | ICD-10-CM

## 2021-09-13 DIAGNOSIS — M48061 Spinal stenosis, lumbar region without neurogenic claudication: Secondary | ICD-10-CM | POA: Diagnosis present

## 2021-09-13 DIAGNOSIS — Z981 Arthrodesis status: Secondary | ICD-10-CM | POA: Diagnosis not present

## 2021-09-13 DIAGNOSIS — F32A Depression, unspecified: Secondary | ICD-10-CM | POA: Diagnosis not present

## 2021-09-13 DIAGNOSIS — F418 Other specified anxiety disorders: Secondary | ICD-10-CM

## 2021-09-13 DIAGNOSIS — F419 Anxiety disorder, unspecified: Secondary | ICD-10-CM | POA: Insufficient documentation

## 2021-09-13 DIAGNOSIS — I10 Essential (primary) hypertension: Secondary | ICD-10-CM | POA: Diagnosis not present

## 2021-09-13 DIAGNOSIS — Z79899 Other long term (current) drug therapy: Secondary | ICD-10-CM | POA: Diagnosis not present

## 2021-09-13 DIAGNOSIS — M4807 Spinal stenosis, lumbosacral region: Secondary | ICD-10-CM | POA: Insufficient documentation

## 2021-09-13 DIAGNOSIS — E039 Hypothyroidism, unspecified: Secondary | ICD-10-CM | POA: Insufficient documentation

## 2021-09-13 DIAGNOSIS — M5117 Intervertebral disc disorders with radiculopathy, lumbosacral region: Secondary | ICD-10-CM | POA: Diagnosis not present

## 2021-09-13 DIAGNOSIS — Z6835 Body mass index (BMI) 35.0-35.9, adult: Secondary | ICD-10-CM | POA: Diagnosis not present

## 2021-09-13 HISTORY — PX: BACK SURGERY: SHX140

## 2021-09-13 SURGERY — POSTERIOR LUMBAR FUSION 1 LEVEL
Anesthesia: General | Site: Back

## 2021-09-13 MED ORDER — KETAMINE HCL 10 MG/ML IJ SOLN
INTRAMUSCULAR | Status: DC | PRN
  Administered 2021-09-13: 30 mg via INTRAVENOUS
  Administered 2021-09-13 (×2): 10 mg via INTRAVENOUS

## 2021-09-13 MED ORDER — MAGNESIUM 500 MG PO TABS: 500.0000 mg | ORAL_TABLET | Freq: Every day | ORAL | Status: DC

## 2021-09-13 MED ORDER — CHLORHEXIDINE GLUCONATE 0.12 % MT SOLN: 15.0000 mL | Freq: Once | OROMUCOSAL | Status: AC

## 2021-09-13 MED ORDER — LIDOCAINE 2% (20 MG/ML) 5 ML SYRINGE
INTRAMUSCULAR | Status: DC | PRN
  Administered 2021-09-13: 60 mg via INTRAVENOUS

## 2021-09-13 MED ORDER — ALBUMIN HUMAN 5 % IV SOLN: INTRAVENOUS | Status: DC | PRN

## 2021-09-13 MED ORDER — THROMBIN 20000 UNITS EX SOLR
CUTANEOUS | Status: DC | PRN
  Administered 2021-09-13: 20 mL via TOPICAL

## 2021-09-13 MED ORDER — TOBRAMYCIN 0.3 % OP SOLN: 1.0000 [drp] | OPHTHALMIC | Status: DC

## 2021-09-13 MED ORDER — SODIUM CHLORIDE 0.9 % IV SOLN
250.0000 mL | INTRAVENOUS | Status: DC
Start: 2021-09-13 — End: 2021-09-14

## 2021-09-13 MED ORDER — ROSUVASTATIN CALCIUM 5 MG PO TABS
10.0000 mg | ORAL_TABLET | Freq: Every day | ORAL | Status: DC
  Administered 2021-09-13: 10 mg via ORAL
  Filled 2021-09-13 (×2): qty 2

## 2021-09-13 MED ORDER — THROMBIN 5000 UNITS EX SOLR
CUTANEOUS | Status: AC
  Filled 2021-09-13: qty 5000

## 2021-09-13 MED ORDER — LACTATED RINGERS IV SOLN: INTRAVENOUS | Status: DC

## 2021-09-13 MED ORDER — CHLORTHALIDONE 25 MG PO TABS: 12.5000 mg | ORAL_TABLET | Freq: Every day | ORAL | Status: DC

## 2021-09-13 MED ORDER — ROCURONIUM BROMIDE 10 MG/ML (PF) SYRINGE
PREFILLED_SYRINGE | INTRAVENOUS | Status: AC
  Filled 2021-09-13: qty 10

## 2021-09-13 MED ORDER — LIDOCAINE 2% (20 MG/ML) 5 ML SYRINGE
INTRAMUSCULAR | Status: AC
  Filled 2021-09-13: qty 5

## 2021-09-13 MED ORDER — ORAL CARE MOUTH RINSE: 15.0000 mL | Freq: Once | OROMUCOSAL | Status: AC

## 2021-09-13 MED ORDER — SUGAMMADEX SODIUM 200 MG/2ML IV SOLN
INTRAVENOUS | Status: DC | PRN
  Administered 2021-09-13: 200 mg via INTRAVENOUS

## 2021-09-13 MED ORDER — PANTOPRAZOLE SODIUM 40 MG IV SOLR
40.0000 mg | Freq: Every day | INTRAVENOUS | Status: DC
  Administered 2021-09-13: 40 mg via INTRAVENOUS
  Filled 2021-09-13: qty 10

## 2021-09-13 MED ORDER — PROPOFOL 10 MG/ML IV BOLUS
INTRAVENOUS | Status: AC
  Filled 2021-09-13: qty 20

## 2021-09-13 MED ORDER — BUPIVACAINE LIPOSOME 1.3 % IJ SUSP
INTRAMUSCULAR | Status: DC | PRN
  Administered 2021-09-13: 20 mL

## 2021-09-13 MED ORDER — CYCLOBENZAPRINE HCL 10 MG PO TABS
10.0000 mg | ORAL_TABLET | Freq: Three times a day (TID) | ORAL | Status: DC | PRN
  Administered 2021-09-13: 10 mg via ORAL
  Filled 2021-09-13: qty 1

## 2021-09-13 MED ORDER — PROPOFOL 10 MG/ML IV BOLUS
INTRAVENOUS | Status: DC | PRN
  Administered 2021-09-13: 130 mg via INTRAVENOUS
  Administered 2021-09-13: 20 mg via INTRAVENOUS

## 2021-09-13 MED ORDER — HYDROMORPHONE HCL 1 MG/ML IJ SOLN: 0.2500 mg | INTRAMUSCULAR | Status: DC | PRN

## 2021-09-13 MED ORDER — EPHEDRINE 5 MG/ML INJ
INTRAVENOUS | Status: AC
  Filled 2021-09-13: qty 5

## 2021-09-13 MED ORDER — HYDROCODONE-ACETAMINOPHEN 10-325 MG PO TABS: 1.0000 | ORAL_TABLET | ORAL | Status: DC | PRN

## 2021-09-13 MED ORDER — BUPIVACAINE LIPOSOME 1.3 % IJ SUSP
INTRAMUSCULAR | Status: AC
  Filled 2021-09-13: qty 20

## 2021-09-13 MED ORDER — CEFAZOLIN SODIUM 1 G IJ SOLR
INTRAMUSCULAR | Status: AC
  Filled 2021-09-13: qty 20

## 2021-09-13 MED ORDER — ONDANSETRON HCL 4 MG/2ML IJ SOLN
INTRAMUSCULAR | Status: DC | PRN
  Administered 2021-09-13: 4 mg via INTRAVENOUS

## 2021-09-13 MED ORDER — KETAMINE HCL 50 MG/5ML IJ SOSY
PREFILLED_SYRINGE | INTRAMUSCULAR | Status: AC
  Filled 2021-09-13: qty 5

## 2021-09-13 MED ORDER — ROCURONIUM BROMIDE 10 MG/ML (PF) SYRINGE
PREFILLED_SYRINGE | INTRAVENOUS | Status: DC | PRN
  Administered 2021-09-13: 30 mg via INTRAVENOUS
  Administered 2021-09-13: 70 mg via INTRAVENOUS

## 2021-09-13 MED ORDER — 0.9 % SODIUM CHLORIDE (POUR BTL) OPTIME
TOPICAL | Status: DC | PRN
  Administered 2021-09-13: 1000 mL

## 2021-09-13 MED ORDER — ALUM & MAG HYDROXIDE-SIMETH 200-200-20 MG/5ML PO SUSP: 30.0000 mL | Freq: Four times a day (QID) | ORAL | Status: DC | PRN

## 2021-09-13 MED ORDER — TURMERIC 500 MG PO CAPS: 300.0000 mg | ORAL_CAPSULE | Freq: Every day | ORAL | Status: DC

## 2021-09-13 MED ORDER — LIDOCAINE-EPINEPHRINE 1 %-1:100000 IJ SOLN
INTRAMUSCULAR | Status: DC | PRN
  Administered 2021-09-13: 20 mL

## 2021-09-13 MED ORDER — LEVOTHYROXINE SODIUM 75 MCG PO TABS
75.0000 ug | ORAL_TABLET | Freq: Every day | ORAL | Status: DC
Start: 2021-09-14 — End: 2021-09-14
  Administered 2021-09-14: 75 ug via ORAL
  Filled 2021-09-13: qty 1

## 2021-09-13 MED ORDER — DEXMEDETOMIDINE (PRECEDEX) IN NS 20 MCG/5ML (4 MCG/ML) IV SYRINGE
PREFILLED_SYRINGE | INTRAVENOUS | Status: DC | PRN
  Administered 2021-09-13: 12 ug via INTRAVENOUS
  Administered 2021-09-13: 8 ug via INTRAVENOUS

## 2021-09-13 MED ORDER — PREGABALIN 100 MG PO CAPS
300.0000 mg | ORAL_CAPSULE | Freq: Two times a day (BID) | ORAL | Status: DC
  Administered 2021-09-13: 300 mg via ORAL
  Filled 2021-09-13: qty 3

## 2021-09-13 MED ORDER — FENTANYL CITRATE (PF) 250 MCG/5ML IJ SOLN
INTRAMUSCULAR | Status: AC
  Filled 2021-09-13: qty 5

## 2021-09-13 MED ORDER — EPHEDRINE SULFATE-NACL 50-0.9 MG/10ML-% IV SOSY
PREFILLED_SYRINGE | INTRAVENOUS | Status: DC | PRN
  Administered 2021-09-13 (×2): 5 mg via INTRAVENOUS
  Administered 2021-09-13: 10 mg via INTRAVENOUS

## 2021-09-13 MED ORDER — SALMON OIL PO CAPS: ORAL_CAPSULE | Freq: Every day | ORAL | Status: DC

## 2021-09-13 MED ORDER — SODIUM CHLORIDE 0.9% FLUSH
3.0000 mL | Freq: Two times a day (BID) | INTRAVENOUS | Status: DC
  Administered 2021-09-13: 3 mL via INTRAVENOUS

## 2021-09-13 MED ORDER — MIDAZOLAM HCL 2 MG/2ML IJ SOLN
INTRAMUSCULAR | Status: DC | PRN
  Administered 2021-09-13: 2 mg via INTRAVENOUS

## 2021-09-13 MED ORDER — ONDANSETRON HCL 4 MG/2ML IJ SOLN: 4.0000 mg | Freq: Four times a day (QID) | INTRAMUSCULAR | Status: DC | PRN

## 2021-09-13 MED ORDER — CHLORHEXIDINE GLUCONATE 0.12 % MT SOLN
OROMUCOSAL | Status: AC
  Administered 2021-09-13: 15 mL via OROMUCOSAL
  Filled 2021-09-13: qty 15

## 2021-09-13 MED ORDER — MENTHOL 3 MG MT LOZG: 1.0000 | LOZENGE | OROMUCOSAL | Status: DC | PRN

## 2021-09-13 MED ORDER — ACETAMINOPHEN 325 MG PO TABS: 650.0000 mg | ORAL_TABLET | ORAL | Status: DC | PRN

## 2021-09-13 MED ORDER — ACETAMINOPHEN 500 MG PO TABS
ORAL_TABLET | ORAL | Status: AC
  Administered 2021-09-13: 500 mg via ORAL
  Filled 2021-09-13: qty 2

## 2021-09-13 MED ORDER — PHENOL 1.4 % MT LIQD
1.0000 | OROMUCOSAL | Status: DC | PRN
Start: 2021-09-13 — End: 2021-09-14

## 2021-09-13 MED ORDER — DEXAMETHASONE SODIUM PHOSPHATE 10 MG/ML IJ SOLN
INTRAMUSCULAR | Status: AC
  Filled 2021-09-13: qty 1

## 2021-09-13 MED ORDER — DEXAMETHASONE SODIUM PHOSPHATE 10 MG/ML IJ SOLN
INTRAMUSCULAR | Status: DC | PRN
  Administered 2021-09-13: 5 mg via INTRAVENOUS

## 2021-09-13 MED ORDER — LOSARTAN POTASSIUM 50 MG PO TABS: 100.0000 mg | ORAL_TABLET | Freq: Every day | ORAL | Status: DC

## 2021-09-13 MED ORDER — CEFAZOLIN SODIUM-DEXTROSE 2-3 GM-%(50ML) IV SOLR
INTRAVENOUS | Status: DC | PRN
  Administered 2021-09-13: 2 g via INTRAVENOUS

## 2021-09-13 MED ORDER — BUSPIRONE HCL 15 MG PO TABS
7.5000 mg | ORAL_TABLET | Freq: Two times a day (BID) | ORAL | Status: DC
  Administered 2021-09-13: 7.5 mg via ORAL
  Filled 2021-09-13: qty 1

## 2021-09-13 MED ORDER — ALLOPURINOL 100 MG PO TABS: 100.0000 mg | ORAL_TABLET | Freq: Every day | ORAL | Status: DC

## 2021-09-13 MED ORDER — SODIUM CHLORIDE 0.9% FLUSH: 3.0000 mL | INTRAVENOUS | Status: DC | PRN

## 2021-09-13 MED ORDER — CEFAZOLIN SODIUM-DEXTROSE 2-4 GM/100ML-% IV SOLN
2.0000 g | Freq: Three times a day (TID) | INTRAVENOUS | Status: AC
  Administered 2021-09-13 – 2021-09-14 (×2): 2 g via INTRAVENOUS
  Filled 2021-09-13 (×2): qty 100

## 2021-09-13 MED ORDER — ONDANSETRON HCL 4 MG PO TABS
4.0000 mg | ORAL_TABLET | Freq: Four times a day (QID) | ORAL | Status: DC | PRN
Start: 2021-09-13 — End: 2021-09-14

## 2021-09-13 MED ORDER — PHENYLEPHRINE HCL-NACL 20-0.9 MG/250ML-% IV SOLN
INTRAVENOUS | Status: DC | PRN
  Administered 2021-09-13: 30 ug/min via INTRAVENOUS

## 2021-09-13 MED ORDER — MIDAZOLAM HCL 2 MG/2ML IJ SOLN
INTRAMUSCULAR | Status: AC
  Filled 2021-09-13: qty 2

## 2021-09-13 MED ORDER — THROMBIN 20000 UNITS EX SOLR
CUTANEOUS | Status: AC
  Filled 2021-09-13: qty 20000

## 2021-09-13 MED ORDER — BUPIVACAINE HCL (PF) 0.25 % IJ SOLN
INTRAMUSCULAR | Status: AC
  Filled 2021-09-13: qty 30

## 2021-09-13 MED ORDER — HYDROMORPHONE HCL 1 MG/ML IJ SOLN
0.5000 mg | INTRAMUSCULAR | Status: DC | PRN
  Administered 2021-09-13: 0.5 mg via INTRAVENOUS
  Filled 2021-09-13: qty 0.5

## 2021-09-13 MED ORDER — AMLODIPINE BESYLATE 5 MG PO TABS: 2.5000 mg | ORAL_TABLET | Freq: Every day | ORAL | Status: DC

## 2021-09-13 MED ORDER — ACETAMINOPHEN 650 MG RE SUPP: 650.0000 mg | RECTAL | Status: DC | PRN

## 2021-09-13 MED ORDER — VITAMIN D 25 MCG (1000 UNIT) PO TABS: 1000.0000 [IU] | ORAL_TABLET | Freq: Every day | ORAL | Status: DC

## 2021-09-13 MED ORDER — DEEP BLUE RELIEF EX GEL: 1.0000 | Freq: Every day | CUTANEOUS | Status: DC | PRN

## 2021-09-13 MED ORDER — ONDANSETRON HCL 4 MG/2ML IJ SOLN
INTRAMUSCULAR | Status: AC
  Filled 2021-09-13: qty 2

## 2021-09-13 MED ORDER — C COMPLEX PO TBCR: EXTENDED_RELEASE_TABLET | Freq: Every day | ORAL | Status: DC

## 2021-09-13 MED ORDER — BIOTIN 2.5 MG PO TABS: 0.5000 mg | ORAL_TABLET | Freq: Every day | ORAL | Status: DC

## 2021-09-13 MED ORDER — FENTANYL CITRATE (PF) 250 MCG/5ML IJ SOLN
INTRAMUSCULAR | Status: DC | PRN
  Administered 2021-09-13 (×5): 50 ug via INTRAVENOUS

## 2021-09-13 MED ORDER — OXYCODONE HCL 5 MG PO TABS
10.0000 mg | ORAL_TABLET | ORAL | Status: DC | PRN
  Administered 2021-09-13 – 2021-09-14 (×5): 10 mg via ORAL
  Filled 2021-09-13 (×5): qty 2

## 2021-09-13 MED ORDER — ACETAMINOPHEN 500 MG PO TABS: 1000.0000 mg | ORAL_TABLET | Freq: Once | ORAL | Status: AC

## 2021-09-13 MED ORDER — METOPROLOL SUCCINATE ER 50 MG PO TB24: 50.0000 mg | ORAL_TABLET | Freq: Every day | ORAL | Status: DC

## 2021-09-13 MED ORDER — CALCIUM 250 MG PO CAPS: 250.0000 mg | ORAL_CAPSULE | Freq: Every day | ORAL | Status: DC

## 2021-09-13 MED ORDER — LIDOCAINE-EPINEPHRINE 1 %-1:100000 IJ SOLN
INTRAMUSCULAR | Status: AC
  Filled 2021-09-13: qty 1

## 2021-09-13 MED ORDER — THROMBIN (RECOMBINANT) 20000 UNITS EX SOLR
CUTANEOUS | Status: AC
  Filled 2021-09-13: qty 20000

## 2021-09-13 MED ORDER — ASPIRIN 81 MG PO TBEC: 81.0000 mg | DELAYED_RELEASE_TABLET | Freq: Every day | ORAL | Status: DC

## 2021-09-13 SURGICAL SUPPLY — 82 items
BAG COUNTER SPONGE SURGICOUNT (BAG) ×3 IMPLANT
BASKET BONE COLLECTION (BASKET) ×2 IMPLANT
BENZOIN TINCTURE PRP APPL 2/3 (GAUZE/BANDAGES/DRESSINGS) ×2 IMPLANT
BLADE BONE MILL MEDIUM (MISCELLANEOUS) ×1 IMPLANT
BLADE CLIPPER SURG (BLADE) IMPLANT
BLADE SURG 11 STRL SS (BLADE) ×2 IMPLANT
BONE VIVIGEN FORMABLE 5.4CC (Bone Implant) ×2 IMPLANT
BUR CUTTER 7.0 ROUND (BURR) ×2 IMPLANT
BUR MATCHSTICK NEURO 3.0 LAGG (BURR) ×2 IMPLANT
CANISTER SUCT 3000ML PPV (MISCELLANEOUS) ×2 IMPLANT
CAP LOCKING THREADED (Cap) ×4 IMPLANT
CARTRIDGE OIL MAESTRO DRILL (MISCELLANEOUS) ×1 IMPLANT
CLSR STERI-STRIP ANTIMIC 1/2X4 (GAUZE/BANDAGES/DRESSINGS) ×1 IMPLANT
CNTNR URN SCR LID CUP LEK RST (MISCELLANEOUS) ×1 IMPLANT
CONT SPEC 4OZ STRL OR WHT (MISCELLANEOUS) ×2
COVER BACK TABLE 60X90IN (DRAPES) ×2 IMPLANT
DERMABOND ADVANCED (GAUZE/BANDAGES/DRESSINGS) ×1
DERMABOND ADVANCED .7 DNX12 (GAUZE/BANDAGES/DRESSINGS) ×1 IMPLANT
DIFFUSER DRILL AIR PNEUMATIC (MISCELLANEOUS) ×2 IMPLANT
DRAPE C-ARM 42X72 X-RAY (DRAPES) ×4 IMPLANT
DRAPE C-ARMOR (DRAPES) IMPLANT
DRAPE HALF SHEET 40X57 (DRAPES) IMPLANT
DRAPE LAPAROTOMY 100X72X124 (DRAPES) ×2 IMPLANT
DRAPE SURG 17X23 STRL (DRAPES) ×2 IMPLANT
DRSG OPSITE 4X5.5 SM (GAUZE/BANDAGES/DRESSINGS) ×2 IMPLANT
DRSG OPSITE POSTOP 4X6 (GAUZE/BANDAGES/DRESSINGS) ×2 IMPLANT
DRSG OPSITE POSTOP 4X8 (GAUZE/BANDAGES/DRESSINGS) ×1 IMPLANT
DRSG TEGADERM 4X4.75 (GAUZE/BANDAGES/DRESSINGS) ×1 IMPLANT
DURAPREP 26ML APPLICATOR (WOUND CARE) ×2 IMPLANT
ELECT BLADE 4.0 EZ CLEAN MEGAD (MISCELLANEOUS) ×2
ELECT REM PT RETURN 9FT ADLT (ELECTROSURGICAL) ×2
ELECTRODE BLDE 4.0 EZ CLN MEGD (MISCELLANEOUS) IMPLANT
ELECTRODE REM PT RTRN 9FT ADLT (ELECTROSURGICAL) ×1 IMPLANT
EVACUATOR 1/8 PVC DRAIN (DRAIN) ×1 IMPLANT
EVACUATOR 3/16  PVC DRAIN (DRAIN)
EVACUATOR 3/16 PVC DRAIN (DRAIN) IMPLANT
GAUZE 4X4 16PLY ~~LOC~~+RFID DBL (SPONGE) IMPLANT
GAUZE SPONGE 4X4 12PLY STRL (GAUZE/BANDAGES/DRESSINGS) ×2 IMPLANT
GLOVE BIO SURGEON STRL SZ7 (GLOVE) IMPLANT
GLOVE BIO SURGEON STRL SZ8 (GLOVE) ×4 IMPLANT
GLOVE BIOGEL PI IND STRL 7.0 (GLOVE) IMPLANT
GLOVE BIOGEL PI INDICATOR 7.0 (GLOVE)
GLOVE EXAM NITRILE XL STR (GLOVE) IMPLANT
GLOVE INDICATOR 8.5 STRL (GLOVE) ×6 IMPLANT
GOWN STRL REUS W/ TWL LRG LVL3 (GOWN DISPOSABLE) IMPLANT
GOWN STRL REUS W/ TWL XL LVL3 (GOWN DISPOSABLE) ×2 IMPLANT
GOWN STRL REUS W/TWL 2XL LVL3 (GOWN DISPOSABLE) IMPLANT
GOWN STRL REUS W/TWL LRG LVL3 (GOWN DISPOSABLE) ×8
GOWN STRL REUS W/TWL XL LVL3 (GOWN DISPOSABLE) ×4
GRAFT BNE MATRIX VG FRMBL MD 5 (Bone Implant) IMPLANT
GRAFT BONE PROTEIOS LRG 5CC (Orthopedic Implant) ×1 IMPLANT
KIT BASIN OR (CUSTOM PROCEDURE TRAY) ×2 IMPLANT
KIT GRAFTMAG DEL NEURO DISP (NEUROSURGERY SUPPLIES) IMPLANT
KIT TURNOVER KIT B (KITS) ×2 IMPLANT
MILL BONE PREP (MISCELLANEOUS) ×2 IMPLANT
NDL HYPO 21X1.5 SAFETY (NEEDLE) IMPLANT
NDL HYPO 25X1 1.5 SAFETY (NEEDLE) ×1 IMPLANT
NEEDLE HYPO 21X1.5 SAFETY (NEEDLE) ×2 IMPLANT
NEEDLE HYPO 25X1 1.5 SAFETY (NEEDLE) ×2 IMPLANT
NS IRRIG 1000ML POUR BTL (IV SOLUTION) ×2 IMPLANT
OIL CARTRIDGE MAESTRO DRILL (MISCELLANEOUS) ×2
PACK LAMINECTOMY NEURO (CUSTOM PROCEDURE TRAY) ×2 IMPLANT
PAD ARMBOARD 7.5X6 YLW CONV (MISCELLANEOUS) ×6 IMPLANT
RASP 3.0MM (RASP) ×1 IMPLANT
ROD 40MM SPINAL (Rod) ×1 IMPLANT
ROD 65MM SPINAL (Rod) ×2 IMPLANT
ROD SPNL 5.5 CREO TI 65 (Rod) IMPLANT
SCREW POLY THRD CREO 6.5X40 (Screw) ×2 IMPLANT
SPACER SUSTAIN RT TI 8X22X11 8 (Spacer) ×2 IMPLANT
SPIKE FLUID TRANSFER (MISCELLANEOUS) ×2 IMPLANT
SPONGE SURGIFOAM ABS GEL 100 (HEMOSTASIS) ×2 IMPLANT
SPONGE T-LAP 4X18 ~~LOC~~+RFID (SPONGE) IMPLANT
STRIP CLOSURE SKIN 1/2X4 (GAUZE/BANDAGES/DRESSINGS) ×3 IMPLANT
SUT VIC AB 0 CT1 18XCR BRD8 (SUTURE) ×2 IMPLANT
SUT VIC AB 0 CT1 8-18 (SUTURE) ×2
SUT VIC AB 2-0 CT1 18 (SUTURE) ×2 IMPLANT
SUT VIC AB 4-0 PS2 27 (SUTURE) ×2 IMPLANT
SYR 20ML LL LF (SYRINGE) ×1 IMPLANT
TOWEL GREEN STERILE (TOWEL DISPOSABLE) ×2 IMPLANT
TOWEL GREEN STERILE FF (TOWEL DISPOSABLE) ×2 IMPLANT
TRAY FOLEY MTR SLVR 16FR STAT (SET/KITS/TRAYS/PACK) ×2 IMPLANT
WATER STERILE IRR 1000ML POUR (IV SOLUTION) ×2 IMPLANT

## 2021-09-13 NOTE — Progress Notes (Signed)
Assessment @ [phone]

## 2021-09-13 NOTE — Transfer of Care (Signed)
Immediate Anesthesia Transfer of Care Note  Patient: Patricia Parks  Procedure(s) Performed: Posterior Lateral and Interbody Fusion  - Lumbar Five-Sacral One/ extension of fusion - (Back)  Patient Location: PACU  Anesthesia Type:General  Level of Consciousness: awake, alert  and oriented  Airway & Oxygen Therapy: Patient Spontanous Breathing and Patient connected to face mask oxygen  Post-op Assessment: Report given to RN and Post -op Vital signs reviewed and stable  Post vital signs: Reviewed and stable  Last Vitals:  Vitals Value Taken Time  BP 117/78 09/13/21 1432  Temp    Pulse 71 09/13/21 1435  Resp    SpO2 98 % 09/13/21 1435  Vitals shown include unvalidated device data.  Last Pain:  Vitals:   09/13/21 0832  TempSrc:   PainSc: 4          Complications: No notable events documented.

## 2021-09-13 NOTE — Progress Notes (Signed)
Orthopedic Tech Progress Note Patient Details:  Patricia Parks 1946/03/05 [id number]  PACU RN called requesting I come and apply LSO BRACE to patient before she was transferred from bed to chair. Patient had brace with her    [name], female   DOB: 1945/04/24, 76 y.o.   MRN: [id number]  Patricia Parks 09/13/2021, 4:11 PM

## 2021-09-13 NOTE — Progress Notes (Signed)
Per Dr. Therisa Doyne, patient received only 1 tb Tylenol 500 mg (patient had 1 tb HYDROcodone-acetaminophen (Goldonna) 10-325 at 06:05 o'clock this AM).

## 2021-09-13 NOTE — H&P (Signed)
Patricia Parks is an 76 y.o. female.   Chief Complaint: Back and right leg pain HPI: 76 year old female with longstanding issues with her back previously undergone L2-L5 fusion.  She has had progressive worsening low back pain with radiation down the right leg in an S1 nerve root pattern.  Work-up revealed disc herniation L5-S1 with severe spinal stenosis and degenerative collapse below her fusion.  Due to her progression of clinical syndrome imaging findings and failed conservative treatment I recommended decompression interbody fusion at L5-S1.  I have extensively gone over the risks and benefits of the operation with her as well as perioperative course expectations of outcome and alternatives to surgery and she understood and agreed to proceed forward.  Past Medical History:  Diagnosis Date   Anxiety    Arthritis    Depression    Fatty liver    Fibromyalgia    Gout 04/2017   Hyperlipidemia    Hypertension    Hypothyroidism        Low kidney function    followed by Dr Alyson Ingles   Spinal stenosis     Past Surgical History:  Procedure Laterality Date   Carpel Tunnel Surgery Right 10/17/2018   both wrists   CATARACT EXTRACTION W/ INTRAOCULAR LENS IMPLANT Bilateral 10/25/2015   Right eye was first.  Left eye done 10/17.   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 Her  smoking use included cigarettes. She has a 1.25 pack-year smoking history. She has never used smokeless tobacco. She reports current alcohol use. She reports that she does not use drugs.  Allergies: No Known Allergies  Medications Prior to Admission  Medication Sig Dispense Refill   allopurinol (ZYLOPRIM) 100 MG tablet Take 100 mg by mouth daily.     amLODipine (NORVASC) 2.5 MG tablet Take 2.5 mg by mouth daily.     aspirin EC 81 MG tablet Take 1 tablet (81 mg total) by mouth daily. Swallow whole. 90 tablet 3   Bioflavonoid Products (C COMPLEX PO) Take 1 tablet by mouth daily.     BIOTIN PO Take 500 mcg by mouth daily.     busPIRone (BUSPAR) 7.5 MG tablet Take 7.5 mg by mouth 2 (two) times daily.     Calcium 250 MG CAPS Take 250 mg by mouth daily.     chlorthalidone (HYGROTON) 25 MG tablet TAKE 1/2 TABLET(12.5 MG) BY MOUTH DAILY 45 tablet 2   cholecalciferol (VITAMIN D3) 25 MCG (1000 UNIT) tablet Take 1,000 Units by mouth daily.     HYDROcodone-acetaminophen (NORCO) 10-325 MG tablet Take 1 tablet by mouth every 5 (five) hours as needed for moderate pain.     levothyroxine (SYNTHROID) 75 MCG tablet Take 75 mcg by mouth  daily before breakfast.     Liniments (DEEP BLUE RELIEF) GEL Apply 1 Application topically daily as needed (pain).     losartan (COZAAR) 100 MG tablet TAKE 1 TABLET BY MOUTH EVERY DAY 90 tablet 3   Magnesium 500 MG TABS Take 500 mg by mouth daily.     metoprolol succinate (TOPROL-XL) 50 MG 24 hr tablet Take 50 mg by mouth daily. Take with or immediately following a meal.     Omega-3 Fatty Acids (SALMON OIL PO) Take 2 capsules by mouth daily.     OVER THE COUNTER MEDICATION Apply 1 application  topically 2 (two) times daily as needed (pain). CBD Oil for back and feet pain     pregabalin (LYRICA) 300 MG capsule Take 300 mg by mouth 2 (two) times daily.      rosuvastatin (CRESTOR) 10 MG tablet TAKE 1 TABLET(10 MG) BY MOUTH DAILY 90 tablet 3   SUPER B COMPLEX/C PO Take 1 tablet by  mouth daily.     tobramycin (TOBREX) 0.3 % ophthalmic solution Place 1 drop into both eyes See admin instructions. Instill 1 drop into left eye 4 times a day the day before, the day of, and the day after eye injections every 2 months     TURMERIC PO Take 300 mg by mouth daily.      Results for orders placed or performed during the hospital encounter of 09/11/21 (from the past 48 hour(s))  Surgical pcr screen     Status: None   Collection Time: 09/11/21  3:47 PM   Specimen: Nasal Mucosa; Nasal Swab  Result Value Ref Range   MRSA, PCR NEGATIVE NEGATIVE   Staphylococcus aureus NEGATIVE NEGATIVE    Comment: (NOTE) The Xpert SA Assay (FDA approved for NASAL specimens in patients 75 years of age and older), is one component of a comprehensive surveillance program. It is not intended to diagnose infection nor to guide or monitor treatment. Performed at [facility], [place] [address], [place], [place] [address]   CBC     Status: Abnormal   Collection Time: 09/11/21  3:49 PM  Result Value Ref Range   WBC 3.8 (L) 4.0 - 10.5 K/uL   RBC 4.07 3.87 - 5.11 MIL/uL   Hemoglobin 12.7 12.0 - 15.0 g/dL   HCT 36.0 36.0 - 46.0 %   MCV 88.5 80.0 - 100.0 fL   MCH 31.2 26.0 - 34.0 pg   MCHC 35.3 30.0 - 36.0 g/dL   RDW 12.6 11.5 - 15.5 %   Platelets 194 150 - 400 K/uL   nRBC 0.0 0.0 - 0.2 %    Comment: Performed at [facility], [place] [address], [place], [place] [address]  Basic metabolic panel     Status: Abnormal   Collection Time: 09/11/21  3:49 PM  Result Value Ref Range   Sodium 139 135 - 145 mmol/L   Potassium 3.5 3.5 - 5.1 mmol/L   Chloride 102 98 - 111 mmol/L   CO2 25 22 - 32 mmol/L   Glucose, Bld 139 (H) 70 - 99 mg/dL    Comment: Glucose reference range applies only to samples taken after fasting for at least 8 hours.   BUN 12 8 - 23 mg/dL   Creatinine, Ser 0.85 0.44 - 1.00 mg/dL   Calcium 9.9 8.9 - 10.3 mg/dL   GFR, Estimated >60 >60 mL/min    Comment: (NOTE) Calculated using  the CKD-EPI Creatinine Equation (2021)    Anion gap  12 5 - 15    Comment: Performed at [facility], [place] [address], [place], [place] [address]  Type and screen [place]     Status: None   Collection Time: 09/11/21  3:55 PM  Result Value Ref Range   ABO/RH(D) A POS    Antibody Screen NEG    Sample Expiration 09/25/2021,2359    Extend sample reason      NO TRANSFUSIONS OR PREGNANCY IN THE PAST 3 MONTHS Performed at [facility], [place] [address] [place],  [address]    No results found.  Review of Systems  Musculoskeletal:  Positive for back pain.  Neurological:  Positive for numbness.    Blood pressure (!) 145/70, pulse 61, temperature 97.7 F (36.5 C), temperature source Oral, resp. rate 18, height '5\' 6"'$  (1.676 m), weight 99.8 kg, last menstrual period 03/05/2000, SpO2 97 %. Physical Exam HENT:     Head: Normocephalic.     Right Ear: Tympanic membrane normal.     Nose: Nose normal.     Mouth/Throat:     Mouth: Mucous membranes are moist.  Cardiovascular:     Rate and Rhythm: Normal rate.  Pulmonary:     Effort: Pulmonary effort is normal.  Abdominal:     General: Abdomen is flat.  Musculoskeletal:        General: Normal range of motion.  Neurological:     Mental Status: She is alert.     Comments: Strength is 5 out of 5 iliopsoas, quads, hamstrings, gastrocs, into tibialis, and EHL.      Assessment/Plan 76 year old presents for decompressive laminectomy interbody fusion L5-S1.  Elaina Hoops, MD 09/13/2021, 10:40 AM

## 2021-09-13 NOTE — Op Note (Signed)
Preoperative diagnosis: Degenerative disc disease lumbar spinal stenosis with foraminal stenosis and bilateral L5-S1 radiculopathies worse on the right.  Postoperative diagnosis: Same  Procedure: #1 decompressive lumbar laminectomy L5-S1 with complete medial facetectomies erotic foraminotomies and removal of the pars at L5 in excess and requiring more work than would be needed with a standard interbody fusion  2.  Posterior lumbar interbody fusion L5-S1 utilizing the globus insert and rotate titanium cages packed with locally harvested autograft mixed with Vivigen and Proteus  3.  Pedicle screw fixation utilizing the globus Creo amp pedicle screw set with threaded caps with cutting of the rod between L3-4 on the left and between L4-5 on the right  4.  Posterior lateral arthrodesis L5-S1  Surgeon: Dominica Severin Jamarques Pinedo  Assistant: Nash Shearer  Anesthesia: General  EBL: 29  HPI: 76 year old female longstanding back pain previously undergone L2-L5 fusion had progressive worsening bilateral L5-S1 radiculopathies from progressive degenerative's disease L5-S1 below her fusion.  Due to her failed conservative treatment imaging findings and progression of clinical syndrome I recommended exploration of fusion and decompression interbody fusion L5-S1 extending her fusion to the sacrum.  I extensively went over the risks and benefits of the operation with her as well as perioperative course expectations of outcome and alternatives to surgery and she understood and agreed to proceed forward.  Operative procedure: Patient was brought into the OR was induced under general anesthesia positioned prone on the Wilson frame her back was prepped and draped in routine sterile fashion.  The inferior aspect of her old incision was opened up the scar tissue was dissected free and expose the hardware from L3-L5 in the facet joints and the L5-S1 interspace.  I then remove the spinous process at L5 performed a complete central  decompressive laminectomy working through the scar tissue to redo the distal foraminotomies of the L5 nerve roots.  Complete medial facetectomies were performed there was marked compression of both L5 nerve roots from severe spondylosis at the L5 interface interface just below the L4-5 disc base.  Working inferiorly performed foraminotomies of the S1 nerve roots as well aggressively under bit the superior tickling facet to gain access lateral margin of disc base and remove the pars at L5.  Then into the disc base cleaned of the disc base bilaterally utilizing sequential distraction opened up to L5 foramina and selected 8 mm wide by 11 mm tall insert and rotate titanium cages packed with the locally harvested autograft mix and sequentially put cage into both sides with extensive mount of autograft between them.  This was all done under fluoroscopy confirmed good position of all the implants.  Then under fluoroscopy we placed bilateral S1 pedicle screws the screws had excellent purchase and imaging confirmed good position.  I then cut the rod between L4-5 on the patient's right and L3-4 on the left having remove the cross-link we then placed 2 new rods compressed L5 against S1.  Aggressively decorticated the facet joints and lateral facets and TPs at L5-S1 and put an extensive mount of autograft mixed to the right and packed a little bit over to the left as well.  All the foramina reinspected to confirm patency no migration of graft material Gelfoam was overlaid top of the dura.  Medium Hemovac drain was placed and the wound was closed in layers after injecting Exparel in the fascia and the skin was closed running 4 subcuticular.  Dermabond benzoin Steri-Strips and sterile dressing was applied patient recovery room in stable condition at the  end the case all needle counts and sponge counts were correct

## 2021-09-13 NOTE — Anesthesia Postprocedure Evaluation (Signed)
Anesthesia Post Note  Patient: Patricia Parks  Procedure(s) Performed: Posterior Lateral and Interbody Fusion  - Lumbar Five-Sacral One/ extension of fusion - (Back)     Patient location during evaluation: PACU Anesthesia Type: General Level of consciousness: awake and alert Pain management: pain level controlled Vital Signs Assessment: post-procedure vital signs reviewed and stable Respiratory status: spontaneous breathing, nonlabored ventilation and respiratory function stable Cardiovascular status: blood pressure returned to baseline and stable Postop Assessment: no apparent nausea or vomiting Anesthetic complications: no   No notable events documented.  Last Vitals:  Vitals:   09/13/21 1545 09/13/21 1600  BP: 106/65   Pulse: (!) 58 (!) 59  Resp: 12 14  Temp:    SpO2:      Last Pain:  Vitals:   09/13/21 1535  TempSrc:   PainSc: 0-No pain                 Tyris Eliot,W. EDMOND

## 2021-09-13 NOTE — Anesthesia Procedure Notes (Signed)
Procedure Name: Intubation Date/Time: 09/13/2021 11:02 AM  Performed by: Griffin Dakin, CRNAPre-anesthesia Checklist: Patient identified, Emergency Drugs available, Suction available and Patient being monitored Patient Re-evaluated:Patient Re-evaluated prior to induction Oxygen Delivery Method: Circle system utilized Preoxygenation: Pre-oxygenation with 100% oxygen Induction Type: IV induction Ventilation: Mask ventilation without difficulty and Oral airway inserted - appropriate to patient size Laryngoscope Size: Mac and 3 Grade View: Grade I Tube type: Oral Tube size: 7.0 mm Number of attempts: 1 Airway Equipment and Method: Stylet and Oral airway Placement Confirmation: ETT inserted through vocal cords under direct vision, positive ETCO2 and breath sounds checked- equal and bilateral Secured at: 22 cm Tube secured with: Tape Dental Injury: Teeth and Oropharynx as per pre-operative assessment

## 2021-09-14 DIAGNOSIS — M5117 Intervertebral disc disorders with radiculopathy, lumbosacral region: Secondary | ICD-10-CM | POA: Diagnosis not present

## 2021-09-14 NOTE — Progress Notes (Signed)
Patient awaiting to be transported via wheelchair by NT for discharge home; in no acute distress nor complaints of pain nor discomfort; moves all extremities well; incision on her back with honeycomb dressing and is clean, dry and intact; room was checked for all her belongings before leaving the unit; discharge instructions concerning her medications, incision care, follow up appointment and when to call the doctor as needed were all discussed with patient by RN and she verbalized understanding on the instructions given.

## 2021-09-14 NOTE — Progress Notes (Signed)
Occupational Therapy Evaluation Patient Details Name: Patricia Parks MRN: [id number] DOB: 1945/06/14 Today's Date: 09/14/2021   History of Present Illness Pt is a85 y/o female presenting on 7/12 for elective PLIF L5-S1. PMH includes: anxiety, depression, arthritis, fibromyalgia, HTN, gout, lumbar fusion.   Clinical Impression   PTA, pt lived at home with her husband and was modified independent. Currently, pt requires min guard A for safety during LB dressing and transfers. Pt with skill to recall 3/3 spinal precautions during session. Pt educated and demonstrating use of compensatory techniques for LB dressing, tub transfers, and toileting within precautions. Handout provided. No OT follow up recommended at this time. Re-consult if change in status.      Recommendations for follow up therapy are one component of a multi-disciplinary discharge planning process, led by the attending physician.  Recommendations may be updated based on patient status, additional functional criteria and insurance authorization.   Follow Up Recommendations  No OT follow up    Assistance Recommended at Discharge Intermittent Supervision/Assistance  Patient can return home with the following A little help with walking and/or transfers;A little help with bathing/dressing/bathroom    Functional Status Assessment     Equipment Recommendations  BSC/3in1    Recommendations for Other Services       Precautions / Restrictions Precautions Precautions: Back Precaution Booklet Issued: Yes (comment) Precaution Comments: Pt educated on post-surgical lumbar spinal fusion precautions. Pt verbalized understanding. Required Braces or Orthoses: Spinal Brace Spinal Brace: Lumbar corset Restrictions Weight Bearing Restrictions: No      Mobility Bed Mobility Overal bed mobility: Needs Assistance Bed Mobility: Rolling, Sidelying to Sit Rolling: Min guard Sidelying to sit: Min guard       General bed mobility  comments: Pt educated and demonstrating use of log roll technique.    Transfers Overall transfer level: Needs assistance Equipment used: Rolling walker (2 wheels) Transfers: Sit to/from Stand Sit to Stand: Min guard           General transfer comment: Pt performing ambulatory transfers using RW with min guard A for safety.      Balance Overall balance assessment: Needs assistance Sitting-balance support: No upper extremity supported Sitting balance-Leahy Scale: Good Sitting balance - Comments: Pt performing figure 4 postition to don LBD.   Standing balance support: Bilateral upper extremity supported Standing balance-Leahy Scale: Poor                             ADL either performed or assessed with clinical judgement   ADL Overall ADL's : Needs assistance/impaired Eating/Feeding: Set up   Grooming: Set up;Sitting   Upper Body Bathing: Set up;Sitting   Lower Body Bathing: Sit to/from stand;Min guard   Upper Body Dressing : Set up;Sitting   Lower Body Dressing: Min guard;Sit to/from stand Lower Body Dressing Details (indicate cue type and reason): Pt educated and demonstrating use of compensatory techniques for LB dressing with min guard for safety during sit<>stand. Toilet Transfer: Min guard;Ambulation;Regular Toilet;Rolling walker (2 wheels) Toilet Transfer Details (indicate cue type and reason): Pt performing simulated toilet transfer with min guard for safety Toileting- Clothing Manipulation and Hygiene: Min guard;Sit to/from stand   Tub/ Shower Transfer: Agricultural engineer (2 wheels);Adhering to back precautions;Tub transfer Tub/Shower Transfer Details (indicate cue type and reason): Pt educated and demonstrating tub transfer with min guard A. Functional mobility during ADLs: Min guard General ADL Comments: Pt educated and demonstrating use of compensatory strategies for LB  dressing, shower transfer, toileting, and brace application within spinal  precautions.     Vision Ability to See in Adequate Light: 0 Adequate Vision Assessment?: No apparent visual deficits Additional Comments: Pt with skill to read from mobile device     Perception     Praxis      Pertinent Vitals/Pain Pain Assessment Pain Assessment: 0-10 Pain Score: 5  Pain Location: surgical site Pain Descriptors / Indicators: Discomfort, Grimacing, Operative site guarding Pain Intervention(s): Limited activity within patient's tolerance, Monitored during session, Relaxation     Hand Dominance Right   Extremity/Trunk Assessment Upper Extremity Assessment Upper Extremity Assessment: Overall WFL for tasks assessed   Lower Extremity Assessment Lower Extremity Assessment: Defer to PT evaluation   Cervical / Trunk Assessment Cervical / Trunk Assessment: Back Surgery   Communication Communication Communication: No difficulties   Cognition Arousal/Alertness: Awake/alert Behavior During Therapy: WFL for tasks assessed/performed Overall Cognitive Status: Within Functional Limits for tasks assessed                                 General Comments: Pt with skill to recall 3/3 spinal precautions.     General Comments  Pt reporting LB dressing was less challenging today following spinal surgery and with use of compensatory techniques.    Exercises     Shoulder Instructions      Home Living Family/patient expects to be discharged to:: Private residence Living Arrangements: Spouse/significant other Available Help at Discharge: Family;Available PRN/intermittently Type of Home: House Home Access: Stairs to enter [employer] of Steps: 2 Entrance Stairs-Rails: Right Home Layout: One level     Bathroom Shower/Tub: Teacher, early years/pre: Standard Bathroom Accessibility: No   Home Equipment: Conservation officer, nature (2 wheels);Grab bars - tub/shower   Additional Comments: Pt reports spouse additionally uses cane for mobility  and can help with some things.      Prior Functioning/Environment Prior Level of Function : Independent/Modified Independent               ADLs Comments: PTA, pt reports that dressing was challenging.        OT Problem List: Decreased strength;Decreased activity tolerance;Impaired balance (sitting and/or standing);Decreased knowledge of use of DME or AE;Decreased knowledge of precautions;Obesity;Pain      OT Treatment/Interventions:      OT Goals(Current goals can be found in the care plan section) Acute Rehab OT Goals Patient Stated Goal: To go home and everntually not need a walker. OT Goal Formulation: With patient Potential to Achieve Goals: Good  OT Frequency:      Co-evaluation              AM-PAC OT "6 Clicks" Daily Activity     Outcome Measure Help from another person eating meals?: A Little Help from another person taking care of personal grooming?: A Little Help from another person toileting, which includes using toliet, bedpan, or urinal?: A Little Help from another person bathing (including washing, rinsing, drying)?: A Little Help from another person to put on and taking off regular upper body clothing?: A Little Help from another person to put on and taking off regular lower body clothing?: A Little 6 Click Score: 18   End of Session Equipment Utilized During Treatment: Rolling walker (2 wheels) Nurse Communication: Mobility status  Activity Tolerance: Patient tolerated treatment well Patient left: in bed;with call bell/phone within reach  OT Visit Diagnosis: Unsteadiness on feet (R26.81);Muscle  weakness (generalized) (M62.81);Pain Pain - part of body:  (surgical site)                Time: 1638-4536 OT Time Calculation (min): 25 min Charges:  OT General Charges $OT Visit: 1 Visit OT Evaluation $OT Eval Low Complexity: 1 Low OT Treatments $Self Care/Home Management : 8-22 mins  Shanda Howells, OTR/L Acute Rehabilitation    Patricia Parks 09/14/2021, 11:11 AM

## 2021-09-14 NOTE — Discharge Instructions (Signed)

## 2021-09-14 NOTE — Plan of Care (Signed)

## 2021-09-14 NOTE — Evaluation (Signed)
Physical Therapy Evaluation  Patient Details Name: Patricia Parks MRN: [id number] DOB: 1945-06-29 Today's Date: 09/14/2021  History of Present Illness  pt is a55 y/o female presenting on 7/12 for elective PLIF L5-S1. PMH includes: anxiety, depression, arthritis, fibromyalgia, HTN, gout, lumbar fusion.  Clinical Impression  Pt admitted with above diagnosis. At the time of PT eval, pt was able to demonstrate transfers and ambulation with gross min guard assist and RW for support. Pt was educated on precautions, brace application/wearing schedule, appropriate activity progression, and car transfer. Pt currently with functional limitations due to the deficits listed below (see PT Problem List). Pt will benefit from skilled PT to increase their independence and safety with mobility to allow discharge to the venue listed below.         Recommendations for follow up therapy are one component of a multi-disciplinary discharge planning process, led by the attending physician.  Recommendations may be updated based on patient status, additional functional criteria and insurance authorization.  Follow Up Recommendations No PT follow up      Assistance Recommended at Discharge PRN  Patient can return home with the following  A little help with walking and/or transfers;A little help with bathing/dressing/bathroom;Assistance with cooking/housework;Assist for transportation;Help with stairs or ramp for entrance    Equipment Recommendations None recommended by PT  Recommendations for Other Services       Functional Status Assessment Patient has had a recent decline in their functional status and demonstrates the ability to make significant improvements in function in a reasonable and predictable amount of time.     Precautions / Restrictions Precautions Precautions: Back Precaution Booklet Issued: Yes (comment) Precaution Comments: Pt educated on post-surgical lumbar spinal fusion precautions. Pt  verbalized understanding. Required Braces or Orthoses: Spinal Brace Spinal Brace: Lumbar corset Restrictions Weight Bearing Restrictions: No      Mobility  Bed Mobility Overal bed mobility: Needs Assistance Bed Mobility: Rolling, Sidelying to Sit, Sit to Sidelying Rolling: Supervision Sidelying to sit: Min guard     Sit to sidelying: Min assist General bed mobility comments: Assist for LE elevation back up into bed at end of session. HOB minimally elevated and no use of rails.    Transfers Overall transfer level: Needs assistance Equipment used: Rolling walker (2 wheels) Transfers: Sit to/from Stand Sit to Stand: Min guard           General transfer comment: Pt demonstrated proper hand placement on seated surface for safety. No assist required.    Ambulation/Gait Ambulation/Gait assistance: Min guard Gait Distance (Feet): 200 Feet Assistive device: Rolling walker (2 wheels) Gait Pattern/deviations: Step-through pattern, Decreased stride length Gait velocity: Decreased Gait velocity interpretation: <1.8 ft/sec, indicate of risk for recurrent falls   General Gait Details: Slow and guarded with RW for support. No overt LOB noted. VC's for improved posture, closer walker proximity, and forward gaze.  Stairs Stairs: Yes Stairs assistance: Min guard, Min assist Stair Management: One rail Right, Step to pattern, Forwards Number of Stairs: 3 General stair comments: VC's for sequencing and general safety. Intermittent assist via LUE HHA.  Wheelchair Mobility    Modified Rankin (Stroke Patients Only)       Balance Overall balance assessment: Needs assistance Sitting-balance support: No upper extremity supported Sitting balance-Leahy Scale: Fair     Standing balance support: Bilateral upper extremity supported Standing balance-Leahy Scale: Poor  Pertinent Vitals/Pain Pain Assessment Pain Assessment: Faces Faces Pain  Scale: Hurts little more Pain Location: surgical site Pain Descriptors / Indicators: Discomfort, Grimacing, Operative site guarding Pain Intervention(s): Limited activity within patient's tolerance, Monitored during session, Repositioned    Home Living Family/patient expects to be discharged to:: Private residence Living Arrangements: Spouse/significant other Available Help at Discharge: Family;Available PRN/intermittently Type of Home: House Home Access: Stairs to enter Entrance Stairs-Rails: Right Entrance Stairs-Number of Steps: 2   Home Layout: One level Home Equipment: Rolling Walker (2 wheels);Grab bars - tub/shower Additional Comments: Pt reports spouse additionally uses cane for mobility and can help with some things.    Prior Function Prior Level of Function : Independent/Modified Independent             Mobility Comments: Utilizing RW for mobility PTA ADLs Comments: PTA, pt reports that dressing was challenging.     Hand Dominance   Dominant Hand: Right    Extremity/Trunk Assessment   Upper Extremity Assessment Upper Extremity Assessment: Defer to OT evaluation    Lower Extremity Assessment Lower Extremity Assessment: Generalized weakness    Cervical / Trunk Assessment Cervical / Trunk Assessment: Back Surgery  Communication   Communication: No difficulties  Cognition Arousal/Alertness: Awake/alert Behavior During Therapy: WFL for tasks assessed/performed Overall Cognitive Status: Within Functional Limits for tasks assessed                                 General Comments: Reviewed handout and pt was cued for precautions during functional mobility.        General Comments General comments (skin integrity, edema, etc.): Pt reporting LB dressing was less challenging today following spinal surgery and with use of compensatory techniques.    Exercises     Assessment/Plan    PT Assessment Patient needs continued PT services  PT  Problem List Decreased strength;Decreased activity tolerance;Decreased balance;Decreased mobility;Decreased knowledge of use of DME;Decreased safety awareness;Decreased knowledge of precautions;Pain       PT Treatment Interventions DME instruction;Gait training;Stair training;Functional mobility training;Therapeutic activities;Therapeutic exercise;Neuromuscular re-education;Patient/family education    PT Goals (Current goals can be found in the Care Plan section)  Acute Rehab PT Goals Patient Stated Goal: home today. get back to exercising in the exercise room at her church, get back to feeding her scottish terrier. PT Goal Formulation: With patient Time For Goal Achievement: 09/21/21 Potential to Achieve Goals: Good    Frequency Min 5X/week     Co-evaluation               AM-PAC PT "6 Clicks" Mobility  Outcome Measure Help needed turning from your back to your side while in a flat bed without using bedrails?: A Little Help needed moving from lying on your back to sitting on the side of a flat bed without using bedrails?: A Little Help needed moving to and from a bed to a chair (including a wheelchair)?: A Little Help needed standing up from a chair using your arms (e.g., wheelchair or bedside chair)?: A Little Help needed to walk in hospital room?: A Little Help needed climbing 3-5 steps with a railing? : A Little 6 Click Score: 18    End of Session Equipment Utilized During Treatment: Gait belt;Back brace Activity Tolerance: Patient tolerated treatment well Patient left: in bed;with call bell/phone within reach Nurse Communication: Mobility status PT Visit Diagnosis: Unsteadiness on feet (R26.81);Pain Pain - part of body:  (back)  Time: 6438-3818 PT Time Calculation (min) (ACUTE ONLY): 26 min   Charges:   PT Evaluation $PT Eval Low Complexity: 1 Low PT Treatments $Gait Training: 8-22 mins        Rolinda Roan, PT, DPT Acute Rehabilitation Services Secure  Chat Preferred Office: [phone]   Thelma Comp 09/14/2021, 1:03 PM

## 2021-09-14 NOTE — Discharge Summary (Signed)
Physician Discharge Summary  [name] MRN: [id number] DOB/AGE: 1945/10/30 76 y.o. Estimated body mass index is 35.51 kg/m as calculated from the following:   Height as of this encounter: '5\' 6"'$  (1.676 m).   Weight as of this encounter: 99.8 kg.   Admit date: 09/13/2021 Discharge date: 09/14/2021  Admission Diagnoses: Degenerative disc disease lumbar spinal stenosis L5-S1  Discharge Diagnoses: Same Principal Problem:   Spinal stenosis of lumbar region   Discharged Condition: good  Hospital Course: Patient was admitted to hospital underwent decompressive laminectomy interbody fusion L5-S1.  Postoperative patient did very well recovered in the floor on the floor was ambulating voiding spontaneously tolerating regular diet and stable for discharge home.  Consults: Significant Diagnostic Studies: Treatments: L5-S1 posterior lumbar interbody fusion Discharge Exam: Blood pressure 115/67, pulse 69, temperature 98.4 F (36.9 C), temperature source Oral, resp. rate 18, height '5\' 6"'$  (1.676 m), weight 99.8 kg, last menstrual period 03/05/2000, SpO2 96 %. Strength 5 out of 5 wound clean dry and intact  Disposition: Home   Allergies as of 09/14/2021   No Known Allergies      Medication List     TAKE these medications    allopurinol 100 MG tablet Commonly known as: ZYLOPRIM Take 100 mg by mouth daily.   amLODipine 2.5 MG tablet Commonly known as: NORVASC Take 2.5 mg by mouth daily.   aspirin EC 81 MG tablet Take 1 tablet (81 mg total) by mouth daily. Swallow whole.   BIOTIN PO Take 500 mcg by mouth daily.   busPIRone 7.5 MG tablet Commonly known as: BUSPAR Take 7.5 mg by mouth 2 (two) times daily.   C COMPLEX PO Take 1 tablet by mouth daily.   Calcium 250 MG Caps Take 250 mg by mouth daily.   chlorthalidone 25 MG tablet Commonly known as: HYGROTON TAKE 1/2 TABLET(12.5 MG) BY MOUTH DAILY   cholecalciferol 25 MCG (1000 UNIT) tablet Commonly  known as: VITAMIN D3 Take 1,000 Units by mouth daily.   Deep Blue Relief Gel Apply 1 Application topically daily as needed (pain).   HYDROcodone-acetaminophen 10-325 MG tablet Commonly known as: NORCO Take 1 tablet by mouth every 5 (five) hours as needed for moderate pain.   levothyroxine 75 MCG tablet Commonly known as: SYNTHROID Take 75 mcg by mouth daily before breakfast.   losartan 100 MG tablet Commonly known as: COZAAR TAKE 1 TABLET BY MOUTH EVERY DAY   Magnesium 500 MG Tabs Take 500 mg by mouth daily.   metoprolol succinate 50 MG 24 hr tablet Commonly known as: TOPROL-XL Take 50 mg by mouth daily. Take with or immediately following a meal.   OVER THE COUNTER MEDICATION Apply 1 application  topically 2 (two) times daily as needed (pain). CBD Oil for back and feet pain   pregabalin 300 MG capsule Commonly known as: LYRICA Take 300 mg by mouth 2 (two) times daily.   rosuvastatin 10 MG tablet Commonly known as: CRESTOR TAKE 1 TABLET(10 MG) BY MOUTH DAILY   SALMON OIL PO Take 2 capsules by mouth daily.   SUPER B COMPLEX/C PO Take 1 tablet by mouth daily.   tobramycin 0.3 % ophthalmic solution Commonly known as: TOBREX Place 1 drop into both eyes See admin instructions. Instill 1 drop into left eye 4 times a day the day before, the day of, and the day after eye injections every 2 months   TURMERIC PO Take 300 mg by mouth daily.  Signed: Elaina Hoops 09/14/2021, 7:34 AM

## 2021-09-15 ENCOUNTER — Encounter (HOSPITAL_COMMUNITY): Payer: Self-pay | Admitting: Neurosurgery

## 2021-09-18 ENCOUNTER — Ambulatory Visit: Payer: Medicare Other | Admitting: Student

## 2021-09-18 DIAGNOSIS — R002 Palpitations: Secondary | ICD-10-CM

## 2021-09-18 DIAGNOSIS — I480 Paroxysmal atrial fibrillation: Secondary | ICD-10-CM

## 2021-09-22 ENCOUNTER — Ambulatory Visit: Payer: Medicare Other | Admitting: Student

## 2021-09-28 IMAGING — RF DG LUMBAR SPINE 2-3V
1 series · 3 of 3 positions shown · non-contrast
Comparison: 12/03/2019

CLINICAL DATA: L2-L5 PLIF

EXAM:
LUMBAR SPINE - 2-3 VIEW; DG C-ARM 1-60 MIN

[Series 1: run · 3 of 3 slices shown]
[im 1/3]
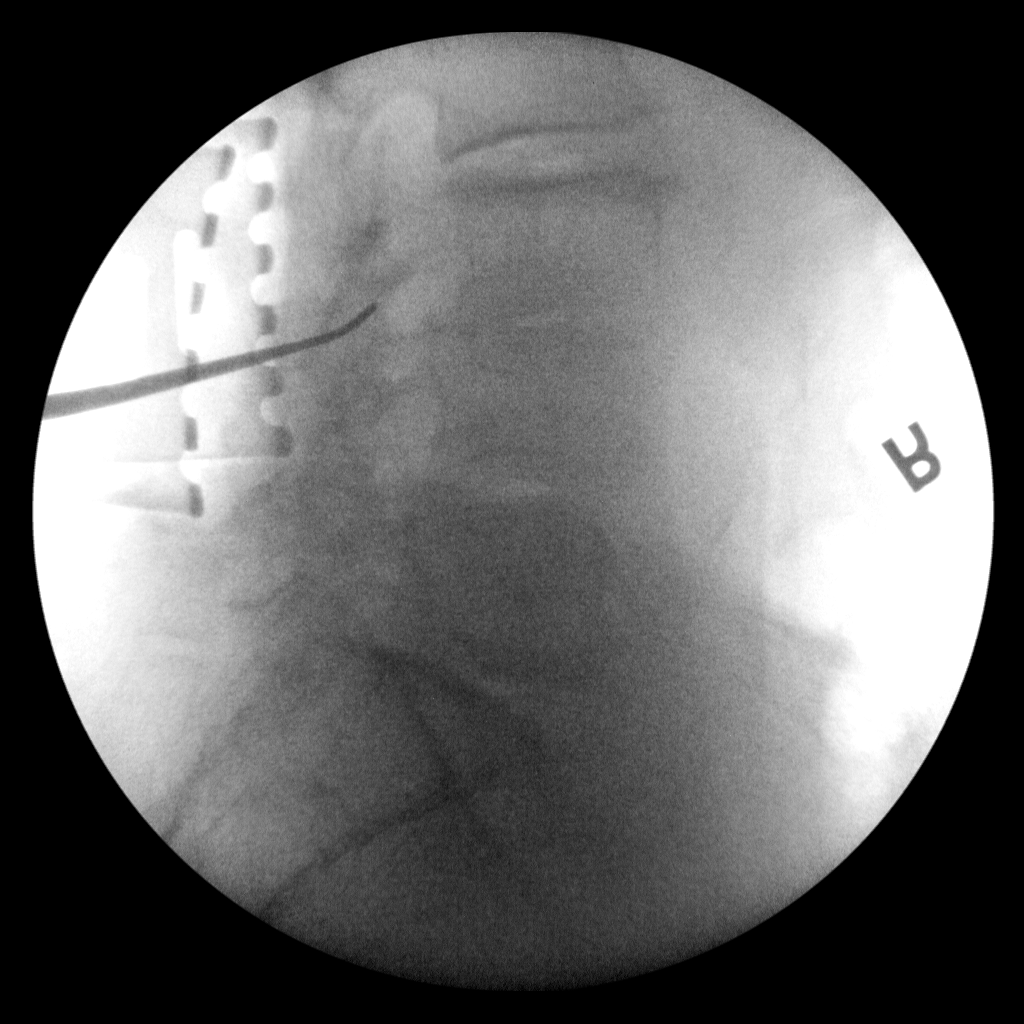
[im 2/3]
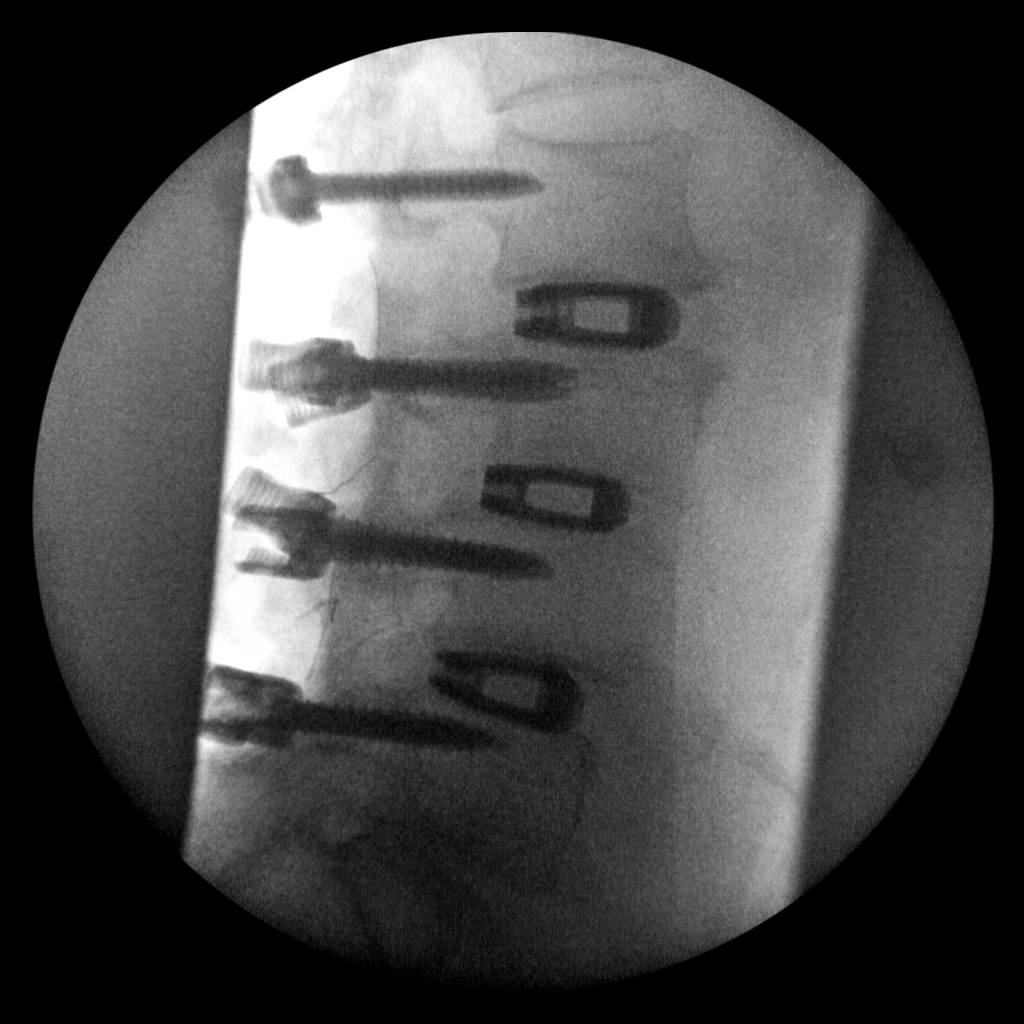
[im 3/3]
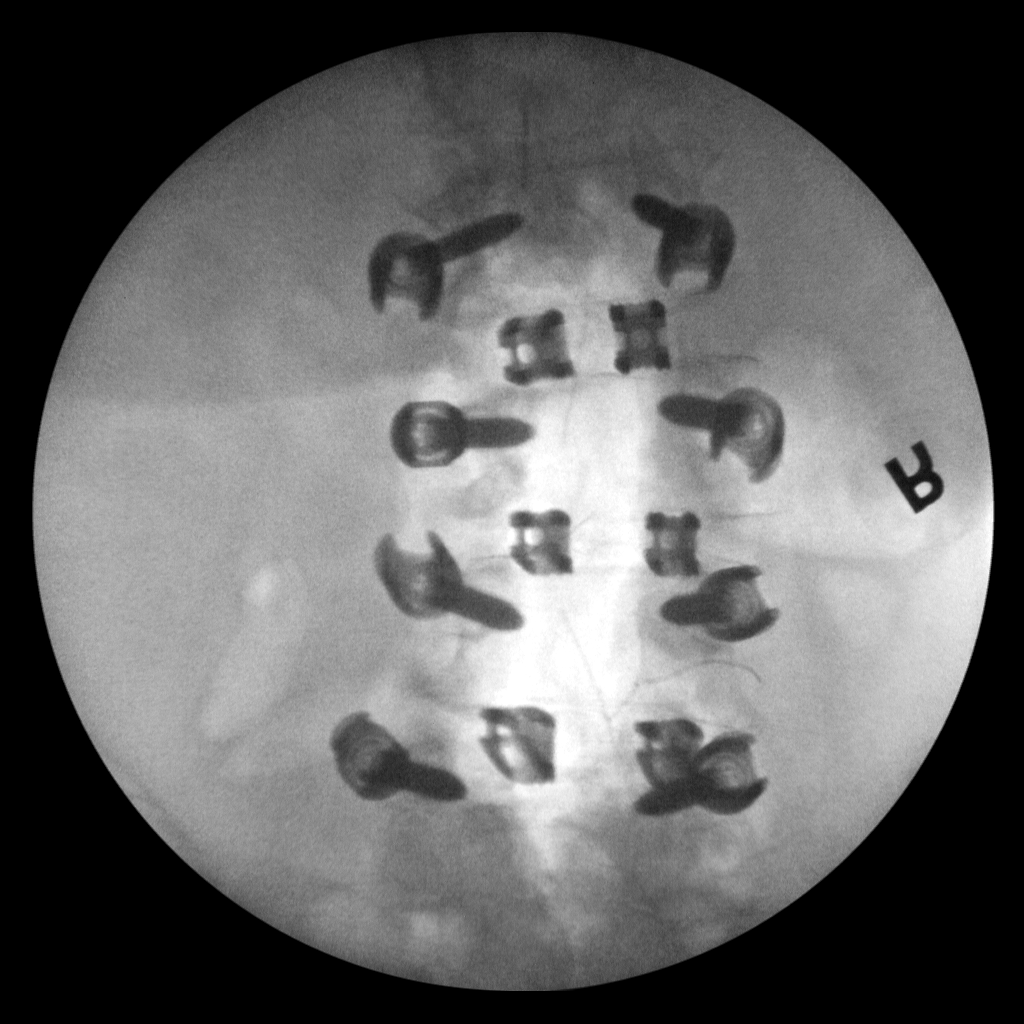

[3 of 3 positions shown; findings below may reference images not displayed]

FLUOROSCOPY TIME:  Radiation Exposure Index (as provided by the
fluoroscopic device): 91.6 mGy

If the device does not provide the exposure index:

Fluoroscopy Time:  1 minutes 32 seconds

Number of Acquired Images:  3
FINDINGS: Initial images demonstrate surgical instrument at the L3-4
interspace. Numbering nomenclature is similar to that utilized on
prior MRI. Subsequent interbody fusion at L2-3, L3-4 and L4-5 is
noted with pedicle screw placement from L2-L5.
IMPRESSION: Fusion from L2-L5.

## 2021-10-20 ENCOUNTER — Encounter: Payer: Self-pay | Admitting: Internal Medicine

## 2021-10-20 ENCOUNTER — Ambulatory Visit: Payer: Medicare Other | Admitting: Internal Medicine

## 2021-10-20 VITALS — BP 123/58 | HR 61 | Temp 97.8°F | Resp 16 | Ht 66.0 in | Wt 218.0 lb

## 2021-10-20 DIAGNOSIS — R002 Palpitations: Secondary | ICD-10-CM

## 2021-10-20 DIAGNOSIS — E782 Mixed hyperlipidemia: Secondary | ICD-10-CM

## 2021-10-20 DIAGNOSIS — I10 Essential (primary) hypertension: Secondary | ICD-10-CM

## 2021-10-20 NOTE — Progress Notes (Signed)
Primary Physician/Referring:  Candida Peeling, PA-C  [name], female    DOB: [birth year], 76 y.o.   MRN: [id number]  No chief complaint on file.   [name]  is a 76 y.o. female  with fibromyalgia, anxiety, depression, hyperlipidemia, hypertension.   Patient is here for a follow-up visit. Her blood pressure is a little low and she is wondering if we can stop one of her meds. Amlodipine can be stopped and pt is agreeable. She denies chest pain, syncope, near syncope.Denies leg edema, PND, orthopnea.   Past Medical History:  Diagnosis Date   Anxiety    Arthritis    Depression    Fatty liver    Fibromyalgia    Gout 04/2017   Hyperlipidemia    Hypertension    Hypothyroidism        Low kidney function    followed by Dr Alyson Ingles   Spinal stenosis    Past Surgical History:  Procedure Laterality Date   BACK SURGERY  09/13/2021   Carpel Tunnel Surgery Right 10/17/2018   both wrists   CATARACT EXTRACTION W/ INTRAOCULAR LENS IMPLANT Bilateral 10/25/2015   Right eye was first.  Left eye done 10/17.   CERVICAL BIOPSY  W/ LOOP ELECTRODE EXCISION  12/2008   hx CIN II   COLONOSCOPY     LUMBAR FUSION     RADIOLOGY WITH ANESTHESIA N/A 07/26/2020   Procedure: MRI WITH ANESTHESIA  LUMBAR WITH AND WITHOUT CONTRAST;  Surgeon: Radiologist, Medication, MD;  Location: [place];  Service: Radiology;  Laterality: N/A;   Family History  Problem Relation Age of Onset   Osteoporosis Mother    Hypertension Mother    Uterine cancer Mother    Heart disease Mother        Atrial fibrillation   Breast cancer Sister 42   Osteoporosis Sister    Diabetes Sister    Diabetes Father    Heart disease Sister    Social History   Tobacco Use   Smoking status: Former    Packs/day: 0.25    Years: 5.00    Total pack years: 1.25    Types: Cigarettes    Quit date: 1975    Years since quitting: 48.6   Smokeless tobacco: Never   Tobacco comments:    in the 57s   Substance Use Topics   Alcohol use: Yes    Comment: Rare  Marital Status: Married  ROS   Review of Systems  Cardiovascular:  Negative for chest pain, claudication, leg swelling, near-syncope, orthopnea, palpitations, paroxysmal nocturnal dyspnea and syncope.  Respiratory:  Negative for shortness of breath.   Musculoskeletal:  Back pain: chronic.  Neurological:  Negative for dizziness.  All other systems reviewed and are negative.     Objective  Blood pressure (!) 123/58, pulse 61, temperature 97.8 F (36.6 C), temperature source Temporal, resp. rate 16, height '5\' 6"'$  (1.676 m), weight 218 lb (98.9 kg), last menstrual period 03/05/2000, SpO2 97 %. Body mass index is 35.19 kg/m.   Physical Exam Vitals reviewed.  Constitutional:      Appearance: Normal appearance.  HENT:     Head: Normocephalic and atraumatic.  Cardiovascular:     Rate and Rhythm: Normal rate and regular rhythm.     Pulses: Normal pulses and intact distal pulses.          Carotid pulses are 2+ on the right side with bruit and 2+ on the left side.  Radial pulses are 2+ on the right side and 2+ on the left side.       Femoral pulses are 2+ on the right side and 2+ on the left side.      Popliteal pulses are 2+ on the right side and 2+ on the left side.       Dorsalis pedis pulses are 2+ on the right side and 2+ on the left side.       Posterior tibial pulses are 2+ on the right side and 2+ on the left side.     Heart sounds: Normal heart sounds, S1 normal and S2 normal. No murmur heard.    No gallop.  Pulmonary:     Effort: Pulmonary effort is normal.     Breath sounds: Normal breath sounds.  Musculoskeletal:     Right lower leg: No edema (trace).     Left lower leg: No edema.  Skin:    General: Skin is warm and dry.  Neurological:     Mental Status: She is alert.    Laboratory examination:      Latest Ref Rng & Units 09/11/2021    3:49 PM 07/24/2021    2:40 PM 06/12/2021    3:20 PM  CMP  Glucose  70 - 99 mg/dL 139  126  115   BUN 8 - 23 mg/dL '12  17  13   '$ Creatinine 0.44 - 1.00 mg/dL 0.85  0.92  0.93   Sodium 135 - 145 mmol/L 139  138  137   Potassium 3.5 - 5.1 mmol/L 3.5  3.9  3.6   Chloride 98 - 111 mmol/L 102  105  102   CO2 22 - 32 mmol/L '25  25  28   '$ Calcium 8.9 - 10.3 mg/dL 9.9  9.5  9.4   Total Protein 6.5 - 8.1 g/dL  5.9    Total Bilirubin 0.3 - 1.2 mg/dL  1.8    Alkaline Phos 38 - 126 U/L  47    AST 15 - 41 U/L  25    ALT 0 - 44 U/L  15        Latest Ref Rng & Units 09/11/2021    3:49 PM 07/24/2021    2:40 PM 06/12/2021    3:20 PM  CBC  WBC 4.0 - 10.5 K/uL 3.8  3.8  3.7   Hemoglobin 12.0 - 15.0 g/dL 12.7  12.1  13.0   Hematocrit 36.0 - 46.0 % 36.0  35.2  37.6   Platelets 150 - 400 K/uL 194  184  203    Lipid Panel     Component Value Date/Time   CHOL 121 10/22/2018 0815   TRIG 104 10/22/2018 0815   HDL 45 10/22/2018 0815   CHOLHDL 6.1 08/29/2013 0102   VLDL UNABLE TO CALCULATE IF TRIGLYCERIDE OVER 400 mg/dL 08/29/2013 0102   LDLCALC 55 10/22/2018 0815   LDLDIRECT 58 10/22/2018 0815   HEMOGLOBIN A1C No results found for: "HGBA1C", "MPG" TSH No results for input(s): "TSH" in the last 8760 hours.  External labs:  10/07/2019: Glucose 101, sodium 140, potassium 4.2, BUN 24, creatinine 0.99, GFR 57 Hemoglobin 12.1, hematocrit 34.8, platelet 178, MCV 89.7 A1c 5.7% TSH 0.746  06/09/2018: Potassium 4.3, serum glucose 122 mg, BUN 18, creatinine 1.2, AST 36, AST 54 both elevated.  Total cholesterol 222, triglycerides 215, HDL 54, LDL 127.  Uric acid 8.4, elevated.  ANA negative.  Rheumatoid factor negative.  BNP 06/27/2020: 118  Radiology :  No results found.  Chest x-ray 06/27/2020: The heart size and mediastinal contours are within normal limits. Both lungs are clear. The visualized skeletal structures are unremarkable.   IMPRESSION:No active cardiopulmonary disease  Cardiac Studies:  Stress Echo 09/25/2013: Patient exercised on Bruce protocol for 7:50  minutes and achieved 10.8 METS.  Achieved 107% of MPHR. - Stress echocardiogram with no chest pain nondiagnostic ST  changes; no stress-induced wall motion abnormalities; interpreted   as normal stress echocardiogram.  Lower extremity venous duplex 05/29/2018: No evidence of deep venous thrombosis. Left leg  Echocardiogram [facility] 06/17/2018: Normal LV systolic function, EF 02-54%.  Mild LVH.  No significant valvular abnormality.  [place] Stress Test 08/06/2018: Nondiagnostic ECG stress due to pharmacologic stress protocol. Normal myocardial perfusion.  Stress LV EF: 72%.  Low risk study.  Venous ultrasound right leg 06/27/2020: Normal compressibility of the common femoral, superficial femoral, and popliteal veins, as well as the visualized calf veins. Limited visualization of the peroneal veins. Visualized portions of profunda femoral vein and great saphenous vein unremarkable. No filling defects to suggest DVT on grayscale or color Doppler imaging. Doppler waveforms show normal direction of venous flow, normal respiratory plasticity and response to augmentation.   Limited views of the contralateral common femoral vein are unremarkable.   Limitations: Limited visualization of the peroneal veins.   IMPRESSION: No evidence of DVT.  Echocardiogram 07/11/2020:  Left ventricle cavity is normal in size. Mild concentric hypertrophy of  the left ventricle. Normal global wall motion. Normal LV systolic function with EF 59%. Doppler evidence of grade I (impaired) diastolic dysfunction, normal LAP.  Mild pulmonic regurgitation.  No evidence of pulmonary hypertension.  Carotid artery duplex 06/14/2021:  No evidence of significant stenosis in the right carotid vessels.  Duplex suggests stenosis in the left internal carotid artery (16-49%).  There is no significant plaque burden. This study may represent FMD (mild).  Antegrade right vertebral artery flow. Antegrade left  vertebral artery flow.  No significant change 06/07/2020.  EKG  07/24/2021: Sinus rhythm at a rate of 69 bpm.  Left atrial enlargement.  Left axis, left anterior fascicular block.  Incomplete right bundle branch block.  Poor R wave progression, cannot exclude anteroseptal infarct old.  Nonspecific T wave abnormality. Compared to EKG 05/25/2020, no significant change.  EKG 05/25/2020: Sinus rhythm at a rate of 75 bpm with single PAC.  Left atrial enlargement.  Left axis, left anterior fascicular block.  Incomplete right bundle branch block.  Nonspecific T wave abnormality.  Compared to EKG 07/17/2018, no significant change.  EKG 07/17/2018: Normal sinus rhythm at rate of 75 bpm, left atrial enlargement, normal axis.  Incomplete right bundle branch block.  No evidence of ischemia.  Assessment   No diagnosis found.   No orders of the defined types were placed in this encounter.  Medications Discontinued During This Encounter  Medication Reason   Bioflavonoid Products (C COMPLEX PO)    amLODipine (NORVASC) 2.5 MG tablet     Recommendations:   Patricia Parks is a pleasant 76 y.o.Caucasian femalewith fibromyalgia, anxiety, depression, hyperlipidemia, hypertension.   Stop amlodipine Continue other cardiac meds Take losartan at nighttime Labs and prior imaging reviewed F/U in 6 months    Floydene Flock, DO, [facility] 10/20/2021, 12:34 PM Office: [phone]

## 2021-11-21 ENCOUNTER — Other Ambulatory Visit: Payer: Self-pay | Admitting: Neurosurgery

## 2021-11-21 DIAGNOSIS — M48062 Spinal stenosis, lumbar region with neurogenic claudication: Secondary | ICD-10-CM

## 2021-12-05 ENCOUNTER — Encounter: Payer: Self-pay | Admitting: Neurology

## 2021-12-14 ENCOUNTER — Ambulatory Visit
Admission: RE | Admit: 2021-12-14 | Discharge: 2021-12-14 | Disposition: A | Discharge status: Home / Self Care | Payer: Medicare Other | Source: Ambulatory Visit | Attending: Neurosurgery | Admitting: Neurosurgery

## 2021-12-14 DIAGNOSIS — M48062 Spinal stenosis, lumbar region with neurogenic claudication: Secondary | ICD-10-CM

## 2022-01-05 ENCOUNTER — Other Ambulatory Visit: Payer: Self-pay

## 2022-01-05 DIAGNOSIS — I10 Essential (primary) hypertension: Secondary | ICD-10-CM

## 2022-01-05 MED ORDER — LOSARTAN POTASSIUM 100 MG PO TABS: 100.0000 mg | ORAL_TABLET | Freq: Every day | ORAL | 3 refills | Status: DC

## 2022-02-12 ENCOUNTER — Encounter: Payer: Self-pay | Admitting: Neurology

## 2022-02-12 ENCOUNTER — Ambulatory Visit: Payer: Medicare Other | Admitting: Neurology

## 2022-02-12 VITALS — BP 155/76 | HR 68 | Ht 66.0 in | Wt 210.6 lb

## 2022-02-12 DIAGNOSIS — G629 Polyneuropathy, unspecified: Secondary | ICD-10-CM | POA: Diagnosis not present

## 2022-02-12 NOTE — Progress Notes (Signed)
[facility] Clinic Note - Initial Visit   Date: 02/12/2022   Patricia Parks MRN: [id number] DOB: Sep 17, 1945   Dear Candida Peeling, PA-C:  Thank you for your kind referral of Patricia Parks for consultation of bilateral feet pain. Although her history is well known to you, please allow [place] to reiterate it for the purpose of our medical record. The patient was accompanied to the clinic by self.    Patricia Parks is a 76 y.o. right-handed female with hypertension, hyperlipidemia, s/p lumbar fusion x 2, chronic pain, fibromyalgia, depression, and hypothyroidism presenting for evaluation of bilateral feet pain.   IMPRESSION/PLAN: Peripheral neuropathy manifesting with painful paresthesias of the feet.  I had extensive discussion with the patient regarding the pathogenesis, etiology, management, and natural course of neuropathy. This is most likely degenerative neuropathy, as she does not have risk factors for neuropathy.  Management is supportive.  NCS/EMG declined. She is established with pain management and who is managing her pain.  She is currently on pregabalin '300mg'$  daily.  She is also already doing PT for balance. Encouraged to use a walker as it provides more stability than a cane. Fall precautions discussed. All questions answered.   Return to clinic as needed  ------------------------------------------------------------- History of present illness: Starting around January 2023, she began having burning, tingling, and sharp pain involving the soles of her feet, which is worse in the right leg. She has associated numbness.  Symptoms are constant. She uses a cane for balance for the past year.  She is enrolled in PT for balance therapy.   She had lumbar surgery by Dr. Saintclair Halsted in July. She has chronic low back pain and is followed by pain management.   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  Nonsmoker and no family history of neuropathy. She has  occasional alcoholic beverage.   Out-side paper records, electronic medical record, and images have been reviewed where available and summarized as:  No results found for: "HGBA1C" No results found for: "VITAMINB12" Lab Results  Component Value Date   TSH 3.370 08/28/2013   No results found for: "ESRSEDRATE", "POCTSEDRATE"  Past Medical History:  Diagnosis Date   Anxiety    Arthritis    Depression    Fatty liver    Fibromyalgia    Gout 04/2017   Hyperlipidemia    Hypertension    Hypothyroidism        Low kidney function    followed by Dr Alyson Ingles   Spinal stenosis     Past Surgical History:  Procedure Laterality Date   BACK SURGERY  09/13/2021   Carpel Tunnel Surgery Right 10/17/2018   both wrists   CATARACT EXTRACTION W/ INTRAOCULAR LENS IMPLANT Bilateral 10/25/2015   Right eye was first.  Left eye done 10/17.   CERVICAL BIOPSY  W/ LOOP ELECTRODE EXCISION  12/2008   hx CIN II   COLONOSCOPY     LUMBAR FUSION     RADIOLOGY WITH ANESTHESIA N/A 07/26/2020   Procedure: MRI WITH ANESTHESIA  LUMBAR WITH AND WITHOUT CONTRAST;  Surgeon: Radiologist, Medication, MD;  Location: [place];  Service: Radiology;  Laterality: N/A;     Medications:  Outpatient Encounter Medications as of 02/12/2022  Medication Sig Note   allopurinol (ZYLOPRIM) 100 MG tablet Take 100 mg by mouth daily.    aspirin EC 81 MG tablet Take 1 tablet (81 mg total) by mouth daily. Swallow whole. 09/01/2021: On hold for procedure    busPIRone (BUSPAR) 7.5 MG  tablet Take 7.5 mg by mouth 2 (two) times daily.    chlorthalidone (HYGROTON) 25 MG tablet TAKE 1/2 TABLET(12.5 MG) BY MOUTH DAILY    cholecalciferol (VITAMIN D3) 25 MCG (1000 UNIT) tablet Take 1,000 Units by mouth daily. 09/01/2021: On hold for procedure    HYDROcodone-acetaminophen (NORCO) 10-325 MG tablet Take 1 tablet by mouth every 5 (five) hours as needed for moderate pain.    levothyroxine (SYNTHROID) 75 MCG tablet Take 75 mcg by mouth daily before  breakfast.    losartan (COZAAR) 100 MG tablet Take 1 tablet (100 mg total) by mouth daily.    metoprolol succinate (TOPROL-XL) 50 MG 24 hr tablet Take 50 mg by mouth daily. Take with or immediately following a meal.    Omega-3 Fatty Acids (SALMON OIL PO) Take 2 capsules by mouth daily. 09/01/2021: On hold for procedure    OVER THE COUNTER MEDICATION Apply 1 application  topically 2 (two) times daily as needed (pain). CBD Oil for back and feet pain    pregabalin (LYRICA) 300 MG capsule Take 300 mg by mouth 2 (two) times daily. One tablet daily 02/12/22    rosuvastatin (CRESTOR) 10 MG tablet TAKE 1 TABLET(10 MG) BY MOUTH DAILY    tobramycin (TOBREX) 0.3 % ophthalmic solution Place 1 drop into both eyes See admin instructions. Instill 1 drop into left eye 4 times a day the day before, the day of, and the day after eye injections every 2 months    BIOTIN PO Take 500 mcg by mouth daily. (Patient not taking: Reported on 02/12/2022) 09/01/2021: On hold for procedure    Calcium 250 MG CAPS Take 250 mg by mouth daily. (Patient not taking: Reported on 02/12/2022) 09/01/2021: On hold for procedure    Liniments (DEEP BLUE RELIEF) GEL Apply 1 Application topically daily as needed (pain). (Patient not taking: Reported on 02/12/2022)    Magnesium 500 MG TABS Take 500 mg by mouth daily. (Patient not taking: Reported on 02/12/2022) 09/01/2021: On hold for procedure    SUPER B COMPLEX/C PO Take 1 tablet by mouth daily. (Patient not taking: Reported on 02/12/2022) 09/01/2021: On hold for procedure    TURMERIC PO Take 300 mg by mouth daily. (Patient not taking: Reported on 02/12/2022) 09/01/2021: On hold for procedure    No facility-administered encounter medications on file as of 02/12/2022.    Allergies: Not on File  Family History: Family History  Problem Relation Age of Onset   Osteoporosis Mother    Hypertension Mother    Uterine cancer Mother    Heart disease Mother        Atrial fibrillation   Breast  cancer Sister 60   Osteoporosis Sister    Diabetes Sister    Diabetes Father    Heart disease Sister     Social History: Social History   Tobacco Use   Smoking status: Former    Packs/day: 0.25    Years: 5.00    Total pack years: 1.25    Types: Cigarettes    Quit date: 1975    Years since quitting: 48.9   Smokeless tobacco: Never   Tobacco comments:    in the 59s  Vaping Use   Vaping Use: Never used  Substance Use Topics   Alcohol use: Yes    Comment: Rare   Drug use: No   Social History   Social History Narrative   ** Merged History Encounter **    Are you right handed or left handed? Right  Are you currently employed ?    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   Who lives with you? With husband   What type of home do you live in: 1 story or 2 story? One   Caffeine 1 cup a day        Vital Signs:  BP (!) 155/76   Pulse 68   Ht '5\' 6"'$  (1.676 m)   Wt 210 lb 9.6 oz (95.5 kg)   LMP 03/05/2000   SpO2 96%   BMI 33.99 kg/m     Neurological Exam: MENTAL STATUS including orientation to time, place, person, recent and remote memory, attention span and concentration, language, and fund of knowledge is normal.  Speech is not dysarthric.  CRANIAL NERVES: II:  No visual field defects.     III-IV-VI: Pupils equal round and reactive to light.  Normal conjugate, extra-ocular eye movements in all directions of gaze.  No nystagmus.  No ptosis.   V:  Normal facial sensation.    VII:  Normal facial symmetry and movements.   VIII:  Normal hearing and vestibular function.   IX-X:  Normal palatal movement.   XI:  Normal shoulder shrug and head rotation.   XII:  Normal tongue strength and range of motion, no deviation or fasciculation.  MOTOR:  No atrophy, fasciculations or abnormal movements.  No pronator drift.   Upper Extremity:  Right  Left  Deltoid  5/5   5/5   Biceps  5/5   5/5   Triceps  5/5   5/5   Wrist extensors  5/5   5/5    Wrist flexors  5/5   5/5   Finger extensors  5/5   5/5   Finger flexors  5/5   5/5   Dorsal interossei  5/5   5/5   Abductor pollicis  5/5   5/5   Tone (Ashworth scale)  0  0   Lower Extremity:  Right  Left  Hip flexors  5/5   5/5   Knee flexors  5/5   5/5   Knee extensors  5/5   5/5   Dorsiflexors  5/5   5/5   Plantarflexors  5/5   5/5   Toe extensors  5/5   5/5   Toe flexors  5/5   5/5   Tone (Ashworth scale)  0  0   MSRs:                                           Right        Left brachioradialis 2+  2+  biceps 2+  2+  triceps 2+  2+  patellar 3+  3+  ankle jerk 1+  1+  Hoffman no  no  plantar response down  down   SENSORY:  Vibration diminished at the ankles and absent at the great toe bilaterally.  Temperature and pin prick reduced from the ankles down into the feet. Romberg's sign present.   COORDINATION/GAIT: Normal finger-to- nose-finger.  Intact rapid alternating movements bilaterally. Gait is mildly wide-based, assisted with cane, appears to be slow and antalgic due to back pain.    Thank you for allowing me to participate in patient's care.  If I can answer any additional questions, I would be pleased to do so.    Sincerely,  Reita Shindler K. Posey Pronto, DO

## 2022-02-13 ENCOUNTER — Other Ambulatory Visit: Payer: Self-pay | Admitting: Pain Medicine

## 2022-02-13 DIAGNOSIS — Z780 Asymptomatic menopausal state: Secondary | ICD-10-CM

## 2022-02-16 ENCOUNTER — Ambulatory Visit
Admission: RE | Admit: 2022-02-16 | Discharge: 2022-02-16 | Disposition: A | Discharge status: Home / Self Care | Payer: Medicare Other | Source: Ambulatory Visit | Attending: Pain Medicine | Admitting: Pain Medicine

## 2022-02-16 DIAGNOSIS — Z780 Asymptomatic menopausal state: Secondary | ICD-10-CM

## 2022-02-19 ENCOUNTER — Other Ambulatory Visit: Payer: Self-pay

## 2022-02-19 MED ORDER — CHLORTHALIDONE 25 MG PO TABS: ORAL_TABLET | ORAL | 3 refills | Status: DC

## 2022-03-09 ENCOUNTER — Other Ambulatory Visit: Payer: Self-pay

## 2022-03-09 DIAGNOSIS — E782 Mixed hyperlipidemia: Secondary | ICD-10-CM

## 2022-03-09 MED ORDER — ROSUVASTATIN CALCIUM 10 MG PO TABS: ORAL_TABLET | ORAL | 3 refills | Status: DC

## 2022-04-19 ENCOUNTER — Other Ambulatory Visit: Payer: Self-pay | Admitting: Neurosurgery

## 2022-04-19 DIAGNOSIS — M48062 Spinal stenosis, lumbar region with neurogenic claudication: Secondary | ICD-10-CM

## 2022-04-23 ENCOUNTER — Ambulatory Visit: Payer: Medicare HMO | Admitting: Internal Medicine

## 2022-05-02 ENCOUNTER — Ambulatory Visit: Payer: Medicare HMO | Admitting: Internal Medicine

## 2022-05-02 ENCOUNTER — Encounter: Payer: Self-pay | Admitting: Internal Medicine

## 2022-05-02 VITALS — BP 145/82 | HR 53 | Ht 66.0 in | Wt 203.2 lb

## 2022-05-02 DIAGNOSIS — E782 Mixed hyperlipidemia: Secondary | ICD-10-CM

## 2022-05-02 DIAGNOSIS — G894 Chronic pain syndrome: Secondary | ICD-10-CM

## 2022-05-02 DIAGNOSIS — I10 Essential (primary) hypertension: Secondary | ICD-10-CM

## 2022-05-02 NOTE — Progress Notes (Signed)
Primary Physician/Referring:  Candida Peeling, PA-C  [name], female    DOB: 06-08-1945, 77 y.o.   MRN: [id number]  Chief Complaint  Patient presents with   Mixed hyperlipidemia   Follow-up     [name]  is a 77 y.o. female  with fibromyalgia, anxiety, depression, hyperlipidemia, hypertension.   Patient is here for a follow-up visit. She has surgery hoping it would help her neck and back pain but she is in even more pain than she started. The surgery did not work and now she is in constant pain, still walking with a cane. She does not have full ROM of her neck and she is very uncomfortable. She has been getting acupuncture therapy which has helped. Her BP is up due to pain but usually it is controlled, last visit it ws even low and we discontinued her amlodipine. From cardiac standpoint, patient has been feeling well. She denies chest pain, syncope, near syncope.Denies leg edema, PND, orthopnea.   Past Medical History:  Diagnosis Date   Anxiety    Arthritis    Depression    Fatty liver    Fibromyalgia    Gout 04/2017   Hyperlipidemia    Hypertension    Hypothyroidism        Low kidney function    followed by Dr Alyson Ingles   Spinal stenosis    Past Surgical History:  Procedure Laterality Date   BACK SURGERY  09/13/2021   Carpel Tunnel Surgery Right 10/17/2018   both wrists   CATARACT EXTRACTION W/ INTRAOCULAR LENS IMPLANT Bilateral 10/25/2015   Right eye was first.  Left eye done 10/17.   CERVICAL BIOPSY  W/ LOOP ELECTRODE EXCISION  12/2008   hx CIN II   COLONOSCOPY     LUMBAR FUSION     RADIOLOGY WITH ANESTHESIA N/A 07/26/2020   Procedure: MRI WITH ANESTHESIA  LUMBAR WITH AND WITHOUT CONTRAST;  Surgeon: Radiologist, Medication, MD;  Location: [facility];  Service: Radiology;  Laterality: N/A;   Family History  Problem Relation Age of Onset   Osteoporosis Mother    Hypertension Mother    Uterine cancer Mother    Heart disease Mother         Atrial fibrillation   Breast cancer Sister 28   Osteoporosis Sister    Diabetes Sister    Diabetes Father    Heart disease Sister    Social History   Tobacco Use   Smoking status: Former    Packs/day: 0.25    Years: 5.00    Total pack years: 1.25    Types: Cigarettes    Quit date: 1975    Years since quitting: 49.1   Smokeless tobacco: Never   Tobacco comments:    in the 53s  Substance Use Topics   Alcohol use: Yes    Comment: Rare  Marital Status: Married  ROS   Review of Systems  Cardiovascular:  Negative for chest pain, claudication, leg swelling, near-syncope, orthopnea, palpitations, paroxysmal nocturnal dyspnea and syncope.  Respiratory:  Negative for shortness of breath.   Musculoskeletal:  Back pain: chronic.  Neurological:  Negative for dizziness.  All other systems reviewed and are negative.     Objective  Blood pressure (!) 145/82, pulse (!) 53, height '5\' 6"'$  (1.676 m), weight 203 lb 3.2 oz (92.2 kg), last menstrual period 03/05/2000, SpO2 98 %. Body mass index is 32.8 kg/m.   Physical Exam Vitals reviewed.  Constitutional:  Appearance: Normal appearance.  HENT:     Head: Normocephalic and atraumatic.  Cardiovascular:     Rate and Rhythm: Normal rate and regular rhythm.     Pulses: Normal pulses and intact distal pulses.          Carotid pulses are 2+ on the right side with bruit and 2+ on the left side.      Radial pulses are 2+ on the right side and 2+ on the left side.       Femoral pulses are 2+ on the right side and 2+ on the left side.      Popliteal pulses are 2+ on the right side and 2+ on the left side.       Dorsalis pedis pulses are 2+ on the right side and 2+ on the left side.       Posterior tibial pulses are 2+ on the right side and 2+ on the left side.     Heart sounds: Normal heart sounds, S1 normal and S2 normal. No murmur heard.    No gallop.  Pulmonary:     Effort: Pulmonary effort is normal.     Breath sounds: Normal  breath sounds.  Musculoskeletal:     Right lower leg: No edema (trace).     Left lower leg: No edema.  Skin:    General: Skin is warm and dry.  Neurological:     Mental Status: She is alert.    Laboratory examination:      Latest Ref Rng & Units 09/11/2021    3:49 PM 07/24/2021    2:40 PM 06/12/2021    3:20 PM  CMP  Glucose 70 - 99 mg/dL 139  126  115   BUN 8 - 23 mg/dL '12  17  13   '$ Creatinine 0.44 - 1.00 mg/dL 0.85  0.92  0.93   Sodium 135 - 145 mmol/L 139  138  137   Potassium 3.5 - 5.1 mmol/L 3.5  3.9  3.6   Chloride 98 - 111 mmol/L 102  105  102   CO2 22 - 32 mmol/L '25  25  28   '$ Calcium 8.9 - 10.3 mg/dL 9.9  9.5  9.4   Total Protein 6.5 - 8.1 g/dL  5.9    Total Bilirubin 0.3 - 1.2 mg/dL  1.8    Alkaline Phos 38 - 126 U/L  47    AST 15 - 41 U/L  25    ALT 0 - 44 U/L  15        Latest Ref Rng & Units 09/11/2021    3:49 PM 07/24/2021    2:40 PM 06/12/2021    3:20 PM  CBC  WBC 4.0 - 10.5 K/uL 3.8  3.8  3.7   Hemoglobin 12.0 - 15.0 g/dL 12.7  12.1  13.0   Hematocrit 36.0 - 46.0 % 36.0  35.2  37.6   Platelets 150 - 400 K/uL 194  184  203    Lipid Panel     Component Value Date/Time   CHOL 121 10/22/2018 0815   TRIG 104 10/22/2018 0815   HDL 45 10/22/2018 0815   CHOLHDL 6.1 08/29/2013 0102   VLDL UNABLE TO CALCULATE IF TRIGLYCERIDE OVER 400 mg/dL 08/29/2013 0102   LDLCALC 55 10/22/2018 0815   LDLDIRECT 58 10/22/2018 0815   HEMOGLOBIN A1C No results found for: "HGBA1C", "MPG" TSH No results for input(s): "TSH" in the last 8760 hours.  External labs:  10/07/2019: Glucose 101, sodium  140, potassium 4.2, BUN 24, creatinine 0.99, GFR 57 Hemoglobin 12.1, hematocrit 34.8, platelet 178, MCV 89.7 A1c 5.7% TSH 0.746  06/09/2018: Potassium 4.3, serum glucose 122 mg, BUN 18, creatinine 1.2, AST 36, AST 54 both elevated.  Total cholesterol 222, triglycerides 215, HDL 54, LDL 127.  Uric acid 8.4, elevated.  ANA negative.  Rheumatoid factor negative.  BNP 06/27/2020:  118  Radiology :  No results found.  Chest x-ray 06/27/2020: The heart size and mediastinal contours are within normal limits. Both lungs are clear. The visualized skeletal structures are unremarkable.   IMPRESSION:No active cardiopulmonary disease  Cardiac Studies:   Stress Echo 09/25/2013: Patient exercised on Bruce protocol for 7:50 minutes and achieved 10.8 METS.  Achieved 107% of MPHR. - Stress echocardiogram with no chest pain nondiagnostic ST  changes; no stress-induced wall motion abnormalities; interpreted   as normal stress echocardiogram.   Lower extremity venous duplex 05/29/2018: No evidence of deep venous thrombosis. Left leg   Echocardiogram [facility] 06/17/2018: Normal LV systolic function, EF 123456.  Mild LVH.  No significant valvular abnormality.   [facility] Stress Test 08/06/2018: Nondiagnostic ECG stress due to pharmacologic stress protocol. Normal myocardial perfusion.  Stress LV EF: 72%.  Low risk study.   Venous ultrasound right leg 06/27/2020: Normal compressibility of the common femoral, superficial femoral, and popliteal veins, as well as the visualized calf veins. Limited visualization of the peroneal veins. Visualized portions of profunda femoral vein and great saphenous vein unremarkable. No filling defects to suggest DVT on grayscale or color Doppler imaging. Doppler waveforms show normal direction of venous flow, normal respiratory plasticity and response to augmentation. Limited views of the contralateral common femoral vein are unremarkable. Limitations: Limited visualization of the peroneal veins. IMPRESSION: No evidence of DVT.   Echocardiogram 07/11/2020:  Left ventricle cavity is normal in size. Mild concentric hypertrophy of  the left ventricle. Normal global wall motion. Normal LV systolic function with EF 59%. Doppler evidence of grade I (impaired) diastolic dysfunction, normal LAP.  Mild pulmonic regurgitation.   No evidence of pulmonary hypertension.   Carotid artery duplex 06/14/2021:  No evidence of significant stenosis in the right carotid vessels.  Duplex suggests stenosis in the left internal carotid artery (16-49%).  There is no significant plaque burden. This study may represent FMD (mild).  Antegrade right vertebral artery flow. Antegrade left vertebral artery flow.  No significant change 06/07/2020.   EKG    07/24/2021: Sinus rhythm at a rate of 69 bpm.  Left atrial enlargement.  Left axis, left anterior fascicular block.  Incomplete right bundle branch block.  Poor R wave progression, cannot exclude anteroseptal infarct old.  Nonspecific T wave abnormality. Compared to EKG 05/25/2020, no significant change.  EKG 05/25/2020: Sinus rhythm at a rate of 75 bpm with single PAC.  Left atrial enlargement.  Left axis, left anterior fascicular block.  Incomplete right bundle branch block.  Nonspecific T wave abnormality.  Compared to EKG 07/17/2018, no significant change.  EKG 07/17/2018: Normal sinus rhythm at rate of 75 bpm, left atrial enlargement, normal axis.  Incomplete right bundle branch block.  No evidence of ischemia.  Assessment     ICD-10-CM   1. Essential hypertension  I10 EKG 12-Lead    2. Mixed hyperlipidemia  E78.2 EKG 12-Lead    3. Chronic pain syndrome  G89.4        No orders of the defined types were placed in this encounter.  Medications Discontinued During This Encounter  Medication  Reason   cholecalciferol (VITAMIN D3) 25 MCG (1000 UNIT) tablet Duplicate   Omega-3 Fatty Acids (SALMON OIL PO) Duplicate   levothyroxine (SYNTHROID) 75 MCG tablet Dose change   pregabalin (LYRICA) 300 MG capsule Dose change   tobramycin (TOBREX) 0.3 % ophthalmic solution Completed Course   SUPER B COMPLEX/C PO     Recommendations:   Patricia Parks is a pleasant 77 y.o. Caucasian female with fibromyalgia, anxiety, depression, hyperlipidemia, hypertension.   Essential  hypertension Continue current cardiac medications. Encourage low-sodium diet, less than 2000 mg daily. Patient is in a significant amount of pain right now, surgery did not work and she is now in even more pain. When she is not I'm this much pain her BP is 120s/70s. Follow-up in 6-12 months or sooner if needed.   Mixed hyperlipidemia Continue crestor Tolerating without issues   Chronic pain syndrome Following with surgery and pain management Patient also getting acupuncture therapy     Floydene Flock, DO, [facility] 05/02/2022, 1:53 PM Office: [phone]

## 2022-05-18 ENCOUNTER — Ambulatory Visit
Admission: RE | Admit: 2022-05-18 | Discharge: 2022-05-18 | Disposition: A | Discharge status: Home / Self Care | Payer: Medicare HMO | Source: Ambulatory Visit | Attending: Neurosurgery | Admitting: Neurosurgery

## 2022-05-18 DIAGNOSIS — M48062 Spinal stenosis, lumbar region with neurogenic claudication: Secondary | ICD-10-CM

## 2022-07-13 ENCOUNTER — Other Ambulatory Visit: Payer: Self-pay | Admitting: Obstetrics & Gynecology

## 2022-07-13 DIAGNOSIS — Z1231 Encounter for screening mammogram for malignant neoplasm of breast: Secondary | ICD-10-CM

## 2022-07-16 NOTE — Progress Notes (Unsigned)
[facility] [phone]   [phone]  INITIAL CONSULT NOTE  Patient Care Team: Darryll Capers, Cordelia Poche as PCP - General (Pain Medicine)  Hematological/Oncological History 06/04/2022: WBC 3.6 (L), Hgb 12.3, Plt 231, Folate 12.94, Iron 109, TIBC 414 (H), Vitamin B12 491, Hepatitis B surface antigen non reactive, Hepatitis C antibody 0.10 (equivocal) 07/17/2022: Establish care with [facility] Hematology  CHIEF COMPLAINTS/PURPOSE OF CONSULTATION:  Leukopenia  HISTORY OF PRESENTING ILLNESS:  Patricia Parks 77 y.o. female with medical history significant for anxiety/depression, hypothyroidism, hypertension, hyperlipidemia, fibromyalgia, fatty liver and gout presents to the hematology clinic for evaluation of leukopenia. She is unaccompanied for this visit.   On exam today, Patricia Parks reports that she does have ongoing fatigue which can impact her ADLs. She denies any appetite or weight change. She does have occasional nausea without vomiting. She is constipation secondary to her pain medication with  her last BM approximately one week ago. She is taking miralalx once every other day with minimal relief. She denies easy bruising or signs of active bleeding. She has chronic low back pain s/p recent spinal surgery in July 2023. She adds that the pain radiates down both her legs. She uses a cane since her surgery for added stability to prevent falls. She reports hot flashes/sweats periodically throughout the day. Her synthroid was  held by her endocrinologist in hope to alleviate her hot flashes but it continues. She plans to follow up with her endocrinology in the next couple of weeks. She reports more depression as her back pain flares up. She denies any suicidal or homicidal ideations. She denies fevers, chills, shortness of breath, chest pain or cough. She has no other complaints. Rest of the ROS is below.   MEDICAL HISTORY:  Past Medical History:  Diagnosis Date    Anxiety    Arthritis    Depression    Fatty liver    Fibromyalgia    Gout 04/2017   Hyperlipidemia    Hypertension    Hypothyroidism        Low kidney function    followed by Dr Nicholos Johns   Spinal stenosis     SURGICAL HISTORY: Past Surgical History:  Procedure Laterality Date   BACK SURGERY  09/13/2021   Carpel Tunnel Surgery Right 10/17/2018   both wrists   CATARACT EXTRACTION W/ INTRAOCULAR LENS IMPLANT Bilateral 10/25/2015   Right eye was first.  Left eye done 10/17.   CERVICAL BIOPSY  W/ LOOP ELECTRODE EXCISION  12/2008   hx CIN II   COLONOSCOPY     LUMBAR FUSION     RADIOLOGY WITH ANESTHESIA N/A 07/26/2020   Procedure: MRI WITH ANESTHESIA  LUMBAR WITH AND WITHOUT CONTRAST;  Surgeon: Radiologist, Medication, MD;  Location: [facility];  Service: Radiology;  Laterality: N/A;    SOCIAL HISTORY: Social History   Socioeconomic History   Marital status: Married    Spouse name: Not on file   Number of children: 2   Years of education: Not on file   Highest education level: Not on file  Occupational History    Comment: Retired  Tobacco Use   Smoking status: Former    Packs/day: 0.25    Years: 5.00    Additional pack years: 0.00    Total pack years: 1.25    Types: Cigarettes    Quit date: 1975    Years since quitting: 49.4   Smokeless tobacco: Never   Tobacco comments:    in the 34s  Vaping  Use   Vaping Use: Never used  Substance and Sexual Activity   Alcohol use: Yes    Comment: Rare   Drug use: No   Sexual activity: Not Currently  Other Topics Concern   Not on file  Social History Narrative   ** Merged History Encounter **    Are you right handed or left handed? Right   Are you currently employed ?    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   Who lives with you? With husband   What type of home do you live in: 1 story or 2 story? One   Caffeine 1 cup a day       Social Determinants of Health   Financial Resource Strain: Not  on file  Food Insecurity: Not on file  Transportation Needs: Not on file  Physical Activity: Not on file  Stress: Not on file  Social Connections: Not on file  Intimate Partner Violence: Not on file    FAMILY HISTORY: Family History  Problem Relation Age of Onset   Osteoporosis Mother    Hypertension Mother    Uterine cancer Mother    Heart disease Mother        Atrial fibrillation   Breast cancer Sister 88   Osteoporosis Sister    Diabetes Sister    Diabetes Father    Heart disease Sister     ALLERGIES:  has No Known Allergies.  MEDICATIONS:  Current Outpatient Medications  Medication Sig Dispense Refill   allopurinol (ZYLOPRIM) 100 MG tablet Take 100 mg by mouth daily.     aspirin EC 81 MG tablet Take 1 tablet (81 mg total) by mouth daily. Swallow whole. 90 tablet 3   busPIRone (BUSPAR) 7.5 MG tablet Take 7.5 mg by mouth 2 (two) times daily.     Calcium 250 MG CAPS Take 250 mg by mouth daily.     chlorthalidone (HYGROTON) 25 MG tablet TAKE 1/2 TABLET(12.5 MG) BY MOUTH DAILY 45 tablet 3   Cholecalciferol 50 MCG (2000 UT) CAPS 2 tablets Orally Daily     HYDROcodone-acetaminophen (NORCO) 10-325 MG tablet Take 1 tablet by mouth every 5 (five) hours as needed for moderate pain.     Liniments (DEEP BLUE RELIEF) GEL Apply 1 Application topically daily as needed (pain).     losartan (COZAAR) 100 MG tablet Take 1 tablet (100 mg total) by mouth daily. 90 tablet 3   methocarbamol (ROBAXIN) 500 MG tablet Take by mouth.     metoprolol succinate (TOPROL-XL) 50 MG 24 hr tablet Take 50 mg by mouth daily. Take with or immediately following a meal.     OVER THE COUNTER MEDICATION Apply 1 application  topically 2 (two) times daily as needed (pain). CBD Oil for back and feet pain     rosuvastatin (CRESTOR) 10 MG tablet TAKE 1 TABLET(10 MG) BY MOUTH DAILY 90 tablet 3   TURMERIC PO Take 300 mg by mouth daily.     BIOTIN PO Take 500 mcg by mouth daily. (Patient not taking: Reported on  02/12/2022)     levothyroxine (SYNTHROID) 50 MCG tablet Take 50 mcg by mouth daily before breakfast. (Patient not taking: Reported on 07/17/2022)     Magnesium 500 MG TABS Take 500 mg by mouth daily. (Patient not taking: Reported on 05/02/2022)     Omega-3 Fatty Acids (SALMON OIL) 200 MG CAPS 1 capsules Orally q day (Patient not taking: Reported on 07/17/2022)  pregabalin (LYRICA) 50 MG capsule Take 50 mg by mouth daily. (Patient not taking: Reported on 07/17/2022)     No current facility-administered medications for this visit.    REVIEW OF SYSTEMS:   Constitutional: ( - ) fevers, ( - )  chills , ( - ) night sweats Eyes: ( - ) blurriness of vision, ( - ) double vision, ( - ) watery eyes Ears, nose, mouth, throat, and face: ( - ) mucositis, ( - ) sore throat Respiratory: ( - ) cough, ( - ) dyspnea, ( - ) wheezes Cardiovascular: ( - ) palpitation, ( - ) chest discomfort, ( - ) lower extremity swelling Gastrointestinal:  ( + ) nausea, ( - ) heartburn, (+ ) change in bowel habits Skin: ( - ) abnormal skin rashes Lymphatics: ( - ) new lymphadenopathy, ( - ) easy bruising Neurological: ( - ) numbness, ( - ) tingling, ( - ) new weaknesses Behavioral/Psych: ( - ) mood change, ( - ) new changes  All other systems were reviewed with the patient and are negative.  PHYSICAL EXAMINATION: ECOG PERFORMANCE STATUS: 1 - Symptomatic but completely ambulatory  Vitals:   07/17/22 1117  BP: (!) 167/69  Pulse: 60  Resp: 17  Temp: 98.1 F (36.7 C)  SpO2: 99%   Filed Weights   07/17/22 1117  Weight: 197 lb 3.2 oz (89.4 kg)    GENERAL: well appearing female in NAD  SKIN: skin color, texture, turgor are normal, no rashes or significant lesions EYES: conjunctiva are pink and non-injected, sclera clear OROPHARYNX: no exudate, no erythema; lips, buccal mucosa, and tongue normal  NECK: supple, non-tender LYMPH:  no palpable lymphadenopathy in the cervical or supraclavicular lymph nodes.  LUNGS: clear  to auscultation and percussion with normal breathing effort HEART: regular rate & rhythm and no murmurs and no lower extremity edema Musculoskeletal: no cyanosis of digits and no clubbing  PSYCH: alert & oriented x 3, fluent speech NEURO: no focal motor/sensory deficits  LABORATORY DATA:  I have reviewed the data as listed    Latest Ref Rng & Units 07/17/2022   11:53 AM 09/11/2021    3:49 PM 07/24/2021    2:40 PM  CBC  WBC 4.0 - 10.5 K/uL 4.4  3.8  3.8   Hemoglobin 12.0 - 15.0 g/dL 16.1  09.6  04.5   Hematocrit 36.0 - 46.0 % 36.2  36.0  35.2   Platelets 150 - 400 K/uL 250  194  184        Latest Ref Rng & Units 07/17/2022   11:53 AM 09/11/2021    3:49 PM 07/24/2021    2:40 PM  CMP  Glucose 70 - 99 mg/dL 409  811  914   BUN 8 - 23 mg/dL 13  12  17    Creatinine 0.44 - 1.00 mg/dL 7.82  9.56  2.13   Sodium 135 - 145 mmol/L 138  139  138   Potassium 3.5 - 5.1 mmol/L 3.6  3.5  3.9   Chloride 98 - 111 mmol/L 103  102  105   CO2 22 - 32 mmol/L 27  25  25    Calcium 8.9 - 10.3 mg/dL 9.9  9.9  9.5   Total Protein 6.5 - 8.1 g/dL 6.8   5.9   Total Bilirubin 0.3 - 1.2 mg/dL 1.1   1.8   Alkaline Phos 38 - 126 U/L 51   47   AST 15 - 41 U/L 15   25  ALT 0 - 44 U/L 12   15     ASSESSMENT & PLAN Patricia Parks is a 77 y.o. female who presents to the clinic for evaluation of leukopenia.   The differentials for leukopenia including nutritional deficiencies, infectious process,  inflammatory process, medication induced, underlying bone marrow disorder. I reviewed that based on outside labs from 06/04/2022, the leukopenia is very mild but we need a differential to further evaluate. Patient will proceed with laboratory evaluation today and I will follow up by phone to review the results with recommendations.      # Leukopenia --Outside labs ruled out hepatits C. Hep B surface antigen was nonreactive but need remaining panel to rule out active/chronic infection. Vitamin B12 was normal but  recommend MMA to confirm no deficiency.  --Will order TSH level due to recent treatment holiday with Synthroid.  --Repeat CBC with differential and CMP.  Additionally, we will order peripheral blood film.  --RTC once workup is complete.   #Constipation: --Currently on miralax once every other day. Recommend to increase to once daily and consider adding Senna-S to regimen as well.   #Depression: --Triggered with ongoing back pain/leg pain.  --No suicidal or homicidal ideation --Referral sent to psych for further evaluation.    Orders Placed This Encounter  Procedures   CBC with Differential (Cancer Center Only)    Standing Status:   Future    Number of Occurrences:   1    Standing Expiration Date:   07/17/2023   CMP (Cancer Center only)    Standing Status:   Future    Number of Occurrences:   1    Standing Expiration Date:   07/17/2023   Technologist smear review    Order Specific Question:   Clinical information:    Answer:   leukopenia   Ferritin    Standing Status:   Future    Number of Occurrences:   1    Standing Expiration Date:   07/17/2023   Iron and Iron Binding Capacity ([facility] only)    Standing Status:   Future    Number of Occurrences:   1    Standing Expiration Date:   07/17/2023   Vitamin B12    Standing Status:   Future    Number of Occurrences:   1    Standing Expiration Date:   07/17/2023   Methylmalonic acid, serum    Standing Status:   Future    Number of Occurrences:   1    Standing Expiration Date:   07/17/2023   Folate, Serum    Standing Status:   Future    Number of Occurrences:   1    Standing Expiration Date:   07/17/2023   TSH    Standing Status:   Future    Number of Occurrences:   1    Standing Expiration Date:   07/17/2023   Hepatitis B surface antibody    Standing Status:   Future    Number of Occurrences:   1    Standing Expiration Date:   07/17/2023   Hepatitis B core antibody, total    Standing Status:   Future    Number of  Occurrences:   1    Standing Expiration Date:   07/17/2023   Ambulatory referral to Psychology    Referral Priority:   Routine    Referral Type:   Psychiatric    Referral Reason:   Specialty Services Required    Requested Specialty:  Psychology    Number of Visits Requested:   1    All questions were answered. The patient knows to call the clinic with any problems, questions or concerns.  I have spent a total of 60 minutes minutes of face-to-face and non-face-to-face time, preparing to see the patient, obtaining and/or reviewing separately obtained history, performing a medically appropriate examination, counseling and educating the patient, ordering tests/procedures, referring and communicating with other health care professionals, documenting clinical information in the electronic health record, and care coordination.   Georga Kaufmann, PA-C Department of Hematology/Oncology [facility] Phone: [phone]

## 2022-07-17 ENCOUNTER — Inpatient Hospital Stay: Payer: Medicare HMO | Attending: Physician Assistant | Admitting: Physician Assistant

## 2022-07-17 ENCOUNTER — Other Ambulatory Visit: Payer: Self-pay

## 2022-07-17 ENCOUNTER — Inpatient Hospital Stay: Payer: Medicare HMO

## 2022-07-17 ENCOUNTER — Encounter: Payer: Self-pay | Admitting: Physician Assistant

## 2022-07-17 VITALS — BP 167/69 | HR 60 | Temp 98.1°F | Resp 17 | Ht 66.0 in | Wt 197.2 lb

## 2022-07-17 DIAGNOSIS — Z808 Family history of malignant neoplasm of other organs or systems: Secondary | ICD-10-CM | POA: Diagnosis not present

## 2022-07-17 DIAGNOSIS — Z803 Family history of malignant neoplasm of breast: Secondary | ICD-10-CM | POA: Diagnosis not present

## 2022-07-17 DIAGNOSIS — Z79620 Long term (current) use of immunosuppressive biologic: Secondary | ICD-10-CM | POA: Insufficient documentation

## 2022-07-17 DIAGNOSIS — Z79899 Other long term (current) drug therapy: Secondary | ICD-10-CM | POA: Diagnosis not present

## 2022-07-17 DIAGNOSIS — F32A Depression, unspecified: Secondary | ICD-10-CM

## 2022-07-17 DIAGNOSIS — D72819 Decreased white blood cell count, unspecified: Secondary | ICD-10-CM

## 2022-07-17 DIAGNOSIS — Z87891 Personal history of nicotine dependence: Secondary | ICD-10-CM | POA: Insufficient documentation

## 2022-07-17 DIAGNOSIS — K5900 Constipation, unspecified: Secondary | ICD-10-CM | POA: Insufficient documentation

## 2022-07-17 LAB — CBC WITH DIFFERENTIAL (CANCER CENTER ONLY)
Abs Immature Granulocytes: 0 10*3/uL (ref 0.00–0.07)
Basophils Absolute: 0 10*3/uL (ref 0.0–0.1)
Basophils Relative: 1 %
Eosinophils Absolute: 0.2 10*3/uL (ref 0.0–0.5)
Eosinophils Relative: 4 %
HCT: 36.2 % (ref 36.0–46.0)
Hemoglobin: 13 g/dL (ref 12.0–15.0)
Immature Granulocytes: 0 %
Lymphocytes Relative: 24 %
Lymphs Abs: 1 10*3/uL (ref 0.7–4.0)
MCH: 32.3 pg (ref 26.0–34.0)
MCHC: 35.9 g/dL (ref 30.0–36.0)
MCV: 89.8 fL (ref 80.0–100.0)
Monocytes Absolute: 0.3 10*3/uL (ref 0.1–1.0)
Monocytes Relative: 7 %
Neutro Abs: 2.8 10*3/uL (ref 1.7–7.7)
Neutrophils Relative %: 64 %
Platelet Count: 250 10*3/uL (ref 150–400)
RBC: 4.03 MIL/uL (ref 3.87–5.11)
RDW: 13.2 % (ref 11.5–15.5)
WBC Count: 4.4 10*3/uL (ref 4.0–10.5)
nRBC: 0 % (ref 0.0–0.2)

## 2022-07-17 LAB — TSH: TSH: 2.306 u[IU]/mL (ref 0.350–4.500)

## 2022-07-17 LAB — TECHNOLOGIST SMEAR REVIEW: Plt Morphology: NORMAL

## 2022-07-17 LAB — CMP (CANCER CENTER ONLY)
ALT: 12 U/L (ref 0–44)
AST: 15 U/L (ref 15–41)
Albumin: 4.6 g/dL (ref 3.5–5.0)
Alkaline Phosphatase: 51 U/L (ref 38–126)
Anion gap: 8 (ref 5–15)
BUN: 13 mg/dL (ref 8–23)
CO2: 27 mmol/L (ref 22–32)
Calcium: 9.9 mg/dL (ref 8.9–10.3)
Chloride: 103 mmol/L (ref 98–111)
Creatinine: 0.8 mg/dL (ref 0.44–1.00)
GFR, Estimated: >60 mL/min (ref >60)
Glucose, Bld: 120 mg/dL — ABNORMAL HIGH (ref 70–99)
Potassium: 3.6 mmol/L (ref 3.5–5.1)
Sodium: 138 mmol/L (ref 135–145)
Total Bilirubin: 1.1 mg/dL (ref 0.3–1.2)
Total Protein: 6.8 g/dL (ref 6.5–8.1)

## 2022-07-17 LAB — VITAMIN B12: Vitamin B-12: 367 pg/mL (ref 180–914)

## 2022-07-17 LAB — FERRITIN: Ferritin: 33 ng/mL (ref 11–307)

## 2022-07-17 LAB — FOLATE: Folate: 10.1 ng/mL (ref >5.9)

## 2022-07-17 LAB — IRON AND IRON BINDING CAPACITY (CC-WL,HP ONLY)
Iron: 77 ug/dL (ref 28–170)
Saturation Ratios: 20 % (ref 10.4–31.8)
TIBC: 377 ug/dL (ref 250–450)
UIBC: 300 ug/dL (ref 148–442)

## 2022-07-17 LAB — HEPATITIS B CORE ANTIBODY, TOTAL: Hep B Core Total Ab: NONREACTIVE

## 2022-07-17 LAB — HEPATITIS B SURFACE ANTIBODY,QUALITATIVE: Hep B S Ab: NONREACTIVE

## 2022-07-19 ENCOUNTER — Other Ambulatory Visit: Payer: Self-pay | Admitting: Neurosurgery

## 2022-07-20 ENCOUNTER — Telehealth: Payer: Self-pay | Admitting: Physician Assistant

## 2022-07-20 LAB — METHYLMALONIC ACID, SERUM: Methylmalonic Acid, Quantitative: 212 nmol/L (ref 0–378)

## 2022-07-20 NOTE — Telephone Encounter (Signed)
I called Ms. Patricia Parks to review the lab results from 07/17/2022. Her WBC has normalized and no other evidence of cytopenias. Remaining workup was negative including no evidence of nutritional deficiencies, thyroid dysfunction, or hepatitis B. Recommend to monitor blood counts with routine labs with her PCP. She can return to our clinic as needed.

## 2022-07-25 ENCOUNTER — Encounter: Payer: Self-pay | Admitting: Internal Medicine

## 2022-07-25 ENCOUNTER — Ambulatory Visit: Payer: Medicare HMO | Admitting: Internal Medicine

## 2022-07-25 VITALS — BP 146/78 | HR 68 | Ht 65.0 in | Wt 196.2 lb

## 2022-07-25 DIAGNOSIS — E782 Mixed hyperlipidemia: Secondary | ICD-10-CM

## 2022-07-25 DIAGNOSIS — R002 Palpitations: Secondary | ICD-10-CM

## 2022-07-25 DIAGNOSIS — I10 Essential (primary) hypertension: Secondary | ICD-10-CM

## 2022-07-25 NOTE — Progress Notes (Signed)
Primary Physician/Referring:  Darryll Capers, PA-C  [name], female    DOB: Jan 03, 1946, 77 y.o.   MRN: [id number]  Chief Complaint  Patient presents with   Palpitations   Follow-up     [name]  is a 77 y.o. female  with fibromyalgia, anxiety, depression, hyperlipidemia, hypertension.   Patient is here for a follow-up visit. She is having repeat surgery on her back next week as there is a screw lose and she is in a lot of pain. She is very nervous since this is going to be her third surgery and she can barely walk without excruciating pain. She has been noticing some palpitations now that the surgery is approaching but she has not had any irregular heart rates or dyspnea with exertion. She did well under anesthesia with her surgery a few months ago, patient does not require pre-op cardiac testing. She denies chest pain, syncope, near syncope. Denies leg edema, PND, orthopnea.   Past Medical History:  Diagnosis Date   Anxiety    Arthritis    Depression    Fatty liver    Fibromyalgia    Gout 04/2017   Hyperlipidemia    Hypertension    Hypothyroidism        Low kidney function    followed by Dr Nicholos Johns   Spinal stenosis    Past Surgical History:  Procedure Laterality Date   BACK SURGERY  09/13/2021   Carpel Tunnel Surgery Right 10/17/2018   both wrists   CATARACT EXTRACTION W/ INTRAOCULAR LENS IMPLANT Bilateral 10/25/2015   Right eye was first.  Left eye done 10/17.   CERVICAL BIOPSY  W/ LOOP ELECTRODE EXCISION  12/2008   hx CIN II   COLONOSCOPY     LUMBAR FUSION     RADIOLOGY WITH ANESTHESIA N/A 07/26/2020   Procedure: MRI WITH ANESTHESIA  LUMBAR WITH AND WITHOUT CONTRAST;  Surgeon: Radiologist, Medication, MD;  Location: [facility] OR;  Service: Radiology;  Laterality: N/A;   Family History  Problem Relation Age of Onset   Osteoporosis Mother    Hypertension Mother    Uterine cancer Mother    Heart disease Mother        Atrial  fibrillation   Breast cancer Sister 64   Osteoporosis Sister    Diabetes Sister    Diabetes Father    Heart disease Sister    Social History   Tobacco Use   Smoking status: Former    Packs/day: 0.25    Years: 5.00    Additional pack years: 0.00    Total pack years: 1.25    Types: Cigarettes    Quit date: 1975    Years since quitting: 49.4   Smokeless tobacco: Never   Tobacco comments:    in the 44s  Substance Use Topics   Alcohol use: Yes    Comment: Rare  Marital Status: Married  ROS   Review of Systems  Cardiovascular:  Negative for chest pain, claudication, leg swelling, near-syncope, orthopnea, palpitations, paroxysmal nocturnal dyspnea and syncope.  Respiratory:  Negative for shortness of breath.   Musculoskeletal:  Back pain: chronic.  Neurological:  Negative for dizziness.  All other systems reviewed and are negative.     Objective  Blood pressure (!) 146/78, pulse 68, height 5\' 5"  (1.651 m), weight 196 lb 3.2 oz (89 kg), last menstrual period 03/05/2000, SpO2 98 %. Body mass index is 32.65 kg/m.   Physical Exam Vitals reviewed.  Constitutional:  Appearance: Normal appearance.  HENT:     Head: Normocephalic and atraumatic.  Cardiovascular:     Rate and Rhythm: Normal rate and regular rhythm.     Pulses: Normal pulses and intact distal pulses.          Carotid pulses are 2+ on the right side with bruit and 2+ on the left side.      Radial pulses are 2+ on the right side and 2+ on the left side.       Femoral pulses are 2+ on the right side and 2+ on the left side.      Popliteal pulses are 2+ on the right side and 2+ on the left side.       Dorsalis pedis pulses are 2+ on the right side and 2+ on the left side.       Posterior tibial pulses are 2+ on the right side and 2+ on the left side.     Heart sounds: Normal heart sounds, S1 normal and S2 normal. No murmur heard.    No gallop.  Pulmonary:     Effort: Pulmonary effort is normal.     Breath  sounds: Normal breath sounds.  Musculoskeletal:     Right lower leg: No edema (trace).     Left lower leg: No edema.  Skin:    General: Skin is warm and dry.  Neurological:     Mental Status: She is alert.    Laboratory examination:      Latest Ref Rng & Units 07/17/2022   11:53 AM 09/11/2021    3:49 PM 07/24/2021    2:40 PM  CMP  Glucose 70 - 99 mg/dL 829  562  130   BUN 8 - 23 mg/dL 13  12  17    Creatinine 0.44 - 1.00 mg/dL 8.65  7.84  6.96   Sodium 135 - 145 mmol/L 138  139  138   Potassium 3.5 - 5.1 mmol/L 3.6  3.5  3.9   Chloride 98 - 111 mmol/L 103  102  105   CO2 22 - 32 mmol/L 27  25  25    Calcium 8.9 - 10.3 mg/dL 9.9  9.9  9.5   Total Protein 6.5 - 8.1 g/dL 6.8   5.9   Total Bilirubin 0.3 - 1.2 mg/dL 1.1   1.8   Alkaline Phos 38 - 126 U/L 51   47   AST 15 - 41 U/L 15   25   ALT 0 - 44 U/L 12   15       Latest Ref Rng & Units 07/17/2022   11:53 AM 09/11/2021    3:49 PM 07/24/2021    2:40 PM  CBC  WBC 4.0 - 10.5 K/uL 4.4  3.8  3.8   Hemoglobin 12.0 - 15.0 g/dL 29.5  28.4  13.2   Hematocrit 36.0 - 46.0 % 36.2  36.0  35.2   Platelets 150 - 400 K/uL 250  194  184    Lipid Panel     Component Value Date/Time   CHOL 121 10/22/2018 0815   TRIG 104 10/22/2018 0815   HDL 45 10/22/2018 0815   CHOLHDL 6.1 08/29/2013 0102   VLDL UNABLE TO CALCULATE IF TRIGLYCERIDE OVER 400 mg/dL 44/03/270 5366   LDLCALC 55 10/22/2018 0815   LDLDIRECT 58 10/22/2018 0815   HEMOGLOBIN A1C No results found for: "HGBA1C", "MPG" TSH Recent Labs    07/17/22 1153  TSH 2.306    External labs:  10/07/2019: Glucose 101, sodium 140, potassium 4.2, BUN 24, creatinine 0.99, GFR 57 Hemoglobin 12.1, hematocrit 34.8, platelet 178, MCV 89.7 A1c 5.7% TSH 0.746  06/09/2018: Potassium 4.3, serum glucose 122 mg, BUN 18, creatinine 1.2, AST 36, AST 54 both elevated.  Total cholesterol 222, triglycerides 215, HDL 54, LDL 127.  Uric acid 8.4, elevated.  ANA negative.  Rheumatoid factor  negative.  BNP 06/27/2020: 118  Radiology :  No results found.  Chest x-ray 06/27/2020: The heart size and mediastinal contours are within normal limits. Both lungs are clear. The visualized skeletal structures are unremarkable.   IMPRESSION:No active cardiopulmonary disease  Cardiac Studies:   Stress Echo 09/25/2013: Patient exercised on Bruce protocol for 7:50 minutes and achieved 10.8 METS.  Achieved 107% of MPHR. - Stress echocardiogram with no chest pain nondiagnostic ST  changes; no stress-induced wall motion abnormalities; interpreted   as normal stress echocardiogram.   Lower extremity venous duplex 05/29/2018: No evidence of deep venous thrombosis. Left leg   Echocardiogram [facility] 06/17/2018: Normal LV systolic function, EF 60-65%.  Mild LVH.  No significant valvular abnormality.   Lexiscan Myoview Stress Test 08/06/2018: Nondiagnostic ECG stress due to pharmacologic stress protocol. Normal myocardial perfusion.  Stress LV EF: 72%.  Low risk study.   Venous ultrasound right leg 06/27/2020: Normal compressibility of the common femoral, superficial femoral, and popliteal veins, as well as the visualized calf veins. Limited visualization of the peroneal veins. Visualized portions of profunda femoral vein and great saphenous vein unremarkable. No filling defects to suggest DVT on grayscale or color Doppler imaging. Doppler waveforms show normal direction of venous flow, normal respiratory plasticity and response to augmentation. Limited views of the contralateral common femoral vein are unremarkable. Limitations: Limited visualization of the peroneal veins. IMPRESSION: No evidence of DVT.   Echocardiogram 07/11/2020:  Left ventricle cavity is normal in size. Mild concentric hypertrophy of  the left ventricle. Normal global wall motion. Normal LV systolic function with EF 59%. Doppler evidence of grade I (impaired) diastolic dysfunction, normal LAP.   Mild pulmonic regurgitation.  No evidence of pulmonary hypertension.   Carotid artery duplex 06/14/2021:  No evidence of significant stenosis in the right carotid vessels.  Duplex suggests stenosis in the left internal carotid artery (16-49%).  There is no significant plaque burden. This study may represent FMD (mild).  Antegrade right vertebral artery flow. Antegrade left vertebral artery flow.  No significant change 06/07/2020.   EKG   07/25/2022: normal sinus rhythm with LAE. Left axis with LAFB and iRBBB. Unchanged from prior EKGs  07/24/2021: Sinus rhythm at a rate of 69 bpm.  Left atrial enlargement.  Left axis, left anterior fascicular block.  Incomplete right bundle branch block.  Poor R wave progression, cannot exclude anteroseptal infarct old.  Nonspecific T wave abnormality. Compared to EKG 05/25/2020, no significant change.  EKG 05/25/2020: Sinus rhythm at a rate of 75 bpm with single PAC.  Left atrial enlargement.  Left axis, left anterior fascicular block.  Incomplete right bundle branch block.  Nonspecific T wave abnormality.  Compared to EKG 07/17/2018, no significant change.  EKG 07/17/2018: Normal sinus rhythm at rate of 75 bpm, left atrial enlargement, normal axis.  Incomplete right bundle branch block.  No evidence of ischemia.  Assessment     ICD-10-CM   1. Palpitations  R00.2 EKG 12-Lead    2. Essential hypertension  I10     3. Mixed hyperlipidemia  E78.2        No orders  of the defined types were placed in this encounter.  Medications Discontinued During This Encounter  Medication Reason   busPIRone (BUSPAR) 7.5 MG tablet      Recommendations:   Ms. Mandee Ginn is a pleasant 77 y.o. Caucasian female with fibromyalgia, anxiety, depression, hyperlipidemia, hypertension.   Essential hypertension Continue current cardiac medications. Encourage low-sodium diet, less than 2000 mg daily. Patient is in a significant amount of pain so her pressure is  elevated Since she is having surgery next week I will hold off on adding or changing her BP meds at this time   Mixed hyperlipidemia Continue crestor Tolerating without issues   Palpitations Suspect this is due to stress of upcoming surgery Her EKG is unchanged from priors No ischemia or tachycardia noted on EKG     Clotilde Dieter, DO, [facility] 07/25/2022, 10:33 AM Office: [phone]

## 2022-07-26 NOTE — Pre-Procedure Instructions (Signed)
Surgical Instructions    Your procedure is scheduled on Wednesday, May 29th.  Report to [facility] Main Entrance "A" at 11:15 A.M., then check in with the Admitting office.  Call this number if you have problems the morning of surgery:  [phone]  If you have any questions prior to your surgery date call [phone]: Open Monday-Friday 8am-4pm If you experience any cold or flu symptoms such as cough, fever, chills, shortness of breath, etc. between now and your scheduled surgery, please notify us at the above number.     Remember:  Do not eat after midnight the night before your surgery  You may drink clear liquids until 10:15 AM the morning of your surgery.   Clear liquids allowed are: Water, Non-Citrus Juices (without pulp), Carbonated Beverages, Clear Tea, Black Coffee Only (NO MILK, CREAM OR POWDERED CREAMER of any kind), and Gatorade.    Take these medicines the morning of surgery with A SIP OF WATER  allopurinol (ZYLOPRIM)  HYDROcodone-acetaminophen (NORCO)  methocarbamol (ROBAXIN)  metoprolol succinate (TOPROL-XL)  rosuvastatin (CRESTOR)    Follow your surgeon's instructions on when to stop Aspirin.  If no instructions were given by your surgeon then you will need to call the office to get those instructions.     As of today, STOP taking any Aleve, Naproxen, Ibuprofen, Motrin, Advil, Goody's, BC's, all herbal medications, fish oil, diclofenac Sodium (VOLTAREN) gel, and all vitamins.                     Do NOT Smoke (Tobacco/Vaping) for 24 hours prior to your procedure.  If you use a CPAP at night, you may bring your mask/headgear for your overnight stay.   Contacts, glasses, piercing's, hearing aid's, dentures or partials may not be worn into surgery, please bring cases for these belongings.    For patients admitted to the hospital, discharge time will be determined by your treatment team.   Patients discharged the day of surgery will not be allowed to drive home,  and someone needs to stay with them for 24 hours.  SURGICAL WAITING ROOM VISITATION Patients having surgery or a procedure may have no more than 2 support people in the waiting area - these visitors may rotate.   Children under the age of 47 must have an adult with them who is not the patient. If the patient needs to stay at the hospital during part of their recovery, the visitor guidelines for inpatient rooms apply. Pre-op nurse will coordinate an appropriate time for 1 support person to accompany patient in pre-op.  This support person may not rotate.   Please refer to the [facility] website for the visitor guidelines for Inpatients (after your surgery is over and you are in a regular room).      Pre-operative 5 CHG Bath Instructions   You can play a key role in reducing the risk of infection after surgery. Your skin needs to be as free of germs as possible. You can reduce the number of germs on your skin by washing with CHG (chlorhexidine gluconate) soap before surgery. CHG is an antiseptic soap that kills germs and continues to kill germs even after washing.   DO NOT use if you have an allergy to chlorhexidine/CHG or antibacterial soaps. If your skin becomes reddened or irritated, stop using the CHG and notify one of our RNs at [phone].   Please shower with the CHG soap starting 4 days before surgery using the following schedule:  Please keep in mind the following:  DO NOT shave, including legs and underarms, starting the day of your first shower.   You may shave your face at any point before/day of surgery.  Place clean sheets on your bed the day you start using CHG soap. Use a clean washcloth (not used since being washed) for each shower. DO NOT sleep with pets once you start using the CHG.   CHG Shower Instructions:  If you choose to wash your hair and private area, wash first with your normal shampoo/soap.  After you use shampoo/soap, rinse your hair and body  thoroughly to remove shampoo/soap residue.  Turn the water OFF and apply about 3 tablespoons (45 ml) of CHG soap to a CLEAN washcloth.  Apply CHG soap ONLY FROM YOUR NECK DOWN TO YOUR TOES (washing for 3-5 minutes)  DO NOT use CHG soap on face, private areas, open wounds, or sores.  Pay special attention to the area where your surgery is being performed.  If you are having back surgery, having someone wash your back for you may be helpful. Wait 2 minutes after CHG soap is applied, then you may rinse off the CHG soap.  Pat dry with a clean towel  Put on clean clothes/pajamas   If you choose to wear lotion, please use ONLY the CHG-compatible lotions on the back of this paper.     Additional instructions for the day of surgery: DO NOT APPLY any lotions, deodorants, cologne, or perfumes.   Put on clean/comfortable clothes.  Brush your teeth.  Ask your nurse before applying any prescription medications to the skin. Do not wear jewelry or makeup Do not bring valuables to the hospital. [facility] is not responsible for any belongings or valuables. Do not wear nail polish, gel polish, artificial nails, or any other type of covering on natural nails (fingers and toes) If you have artificial nails or gel coating that need to be removed by a nail salon, please have this removed prior to surgery. Artificial nails or gel coating may interfere with anesthesia's ability to adequately monitor your vital signs.    CHG Compatible Lotions   Aveeno Moisturizing lotion  Cetaphil Moisturizing Cream  Cetaphil Moisturizing Lotion  Clairol Herbal Essence Moisturizing Lotion, Dry Skin  Clairol Herbal Essence Moisturizing Lotion, Extra Dry Skin  Clairol Herbal Essence Moisturizing Lotion, Normal Skin  Curel Age Defying Therapeutic Moisturizing Lotion with Alpha Hydroxy  Curel Extreme Care Body Lotion  Curel Soothing Hands Moisturizing Hand Lotion  Curel Therapeutic Moisturizing Cream, Fragrance-Free  Curel  Therapeutic Moisturizing Lotion, Fragrance-Free  Curel Therapeutic Moisturizing Lotion, Original Formula  Eucerin Daily Replenishing Lotion  Eucerin Dry Skin Therapy Plus Alpha Hydroxy Crme  Eucerin Dry Skin Therapy Plus Alpha Hydroxy Lotion  Eucerin Original Crme  Eucerin Original Lotion  Eucerin Plus Crme Eucerin Plus Lotion  Eucerin TriLipid Replenishing Lotion  Keri Anti-Bacterial Hand Lotion  Keri Deep Conditioning Original Lotion Dry Skin Formula Softly Scented  Keri Deep Conditioning Original Lotion, Fragrance Free Sensitive Skin Formula  Keri Lotion Fast Absorbing Fragrance Free Sensitive Skin Formula  Keri Lotion Fast Absorbing Softly Scented Dry Skin Formula  Keri Original Lotion  Keri Skin Renewal Lotion Keri Silky Smooth Lotion  Keri Silky Smooth Sensitive Skin Lotion  Nivea Body Creamy Conditioning Oil  Nivea Body Extra Enriched Teacher, adult education Moisturizing Lotion Nivea Crme  Nivea Skin Firming Lotion  NutraDerm 30 Skin Lotion  NutraDerm Skin Lotion  NutraDerm  Therapeutic Skin Cream  NutraDerm Therapeutic Skin Lotion  ProShield Protective Hand Cream  Provon moisturizing lotion        Please read over the following fact sheets that you were given.    If you received a COVID test during your pre-op visit  it is requested that you wear a mask when out in public, stay away from anyone that may not be feeling well and notify your surgeon if you develop symptoms. If you have been in contact with anyone that has tested positive in the last 10 days please notify you surgeon.

## 2022-07-27 ENCOUNTER — Inpatient Hospital Stay (HOSPITAL_COMMUNITY)
Admission: RE | Admit: 2022-07-27 | Discharge: 2022-07-27 | Disposition: A | Discharge status: Home / Self Care | Payer: Medicare HMO | Source: Ambulatory Visit

## 2022-07-31 ENCOUNTER — Other Ambulatory Visit (HOSPITAL_COMMUNITY): Payer: Self-pay | Admitting: *Deleted

## 2022-07-31 NOTE — Pre-Procedure Instructions (Signed)
Surgical Instructions    Your procedure is scheduled on Friday, June 3.  Report to [facility] Main Entrance "A" at 0530 A.M., then check in with the Admitting office.  Call this number if you have problems the morning of surgery:  [phone]  If you have any questions prior to your surgery date call [phone]: Open Monday-Friday 8am-4pm If you experience any cold or flu symptoms such as cough, fever, chills, shortness of breath, etc. between now and your scheduled surgery, please notify us at the above number.     Remember:  Do not eat after midnight the night before your surgery  You may drink clear liquids until 0430 AM the morning of your surgery.   Clear liquids allowed are: Water, Non-Citrus Juices (without pulp), Carbonated Beverages, Clear Tea, Black Coffee Only (NO MILK, CREAM OR POWDERED CREAMER of any kind), and Gatorade.    Take these medicines the morning of surgery with A SIP OF WATER:  Allopurinol (Zyloprim) Hydrocodone-acetaminophen (Norco) Methocarbamol (Robaxin)   Metoprolol succinate (TOPROL XL) Rosuvastatin (CRESTOR) Tobramycin (TOBREX) eye drops   Follow your surgeon's instructions on when to stop Aspirin.  If no instructions were given by your surgeon then you will need to call the office to get those instructions.     As of today, STOP taking any Aleve, Naproxen, Ibuprofen, Motrin, Advil, Goody's, BC's, all herbal medications, fish oil, and all vitamins.                     Do NOT Smoke (Tobacco/Vaping) for 24 hours prior to your procedure.  If you use a CPAP at night, you may bring your mask/headgear for your overnight stay.   Contacts, glasses, piercing's, hearing aid's, dentures or partials may not be worn into surgery, please bring cases for these belongings.    For patients admitted to the hospital, discharge time will be determined by your treatment team.   Patients discharged the day of surgery will not be allowed to drive home, and someone  needs to stay with them for 24 hours.  SURGICAL WAITING ROOM VISITATION Patients having surgery or a procedure may have no more than 2 support people in the waiting area - these visitors may rotate.   Children under the age of 33 must have an adult with them who is not the patient. If the patient needs to stay at the hospital during part of their recovery, the visitor guidelines for inpatient rooms apply. Pre-op nurse will coordinate an appropriate time for 1 support person to accompany patient in pre-op.  This support person may not rotate.   Please refer to the [facility] website for the visitor guidelines for Inpatients (after your surgery is over and you are in a regular room).    Special instructions:   Patricia Parks- Preparing For Surgery  Before surgery, you can play an important role. Because skin is not sterile, your skin needs to be as free of germs as possible. You can reduce the number of germs on your skin by washing with CHG (chlorahexidine gluconate) Soap before surgery.  CHG is an antiseptic cleaner which kills germs and bonds with the skin to continue killing germs even after washing.    Oral Hygiene is also important to reduce your risk of infection.  Remember - BRUSH YOUR TEETH THE MORNING OF SURGERY WITH YOUR REGULAR TOOTHPASTE     Day of Surgery: Take a shower with CHG soap. Do not wear jewelry or makeup Do not wear lotions,  powders, perfumes/colognes, or deodorant. Do not shave 48 hours prior to surgery.  Men may shave face and neck. Do not bring valuables to the hospital.  [facility] is not responsible for any belongings or valuables. Do not wear nail polish, gel polish, artificial nails, or any other type of covering on natural nails (fingers and toes) If you have artificial nails or gel coating that need to be removed by a nail salon, please have this removed prior to surgery. Artificial nails or gel coating may interfere with anesthesia's ability to adequately  monitor your vital signs. Wear Clean/Comfortable clothing the morning of surgery Remember to brush your teeth WITH YOUR REGULAR TOOTHPASTE.   Please read over the following fact sheets that you were given.    If you received a COVID test during your pre-op visit  it is requested that you wear a mask when out in public, stay away from anyone that may not be feeling well and notify your surgeon if you develop symptoms. If you have been in contact with anyone that has tested positive in the last 10 days please notify you surgeon.

## 2022-08-01 ENCOUNTER — Other Ambulatory Visit (HOSPITAL_COMMUNITY): Payer: Medicare HMO

## 2022-08-01 ENCOUNTER — Inpatient Hospital Stay (HOSPITAL_COMMUNITY): Admission: RE | Admit: 2022-08-01 | Payer: Medicare HMO | Source: Ambulatory Visit | Admitting: Neurosurgery

## 2022-08-01 SURGERY — LAMINECTOMY WITH POSTERIOR LATERAL ARTHRODESIS LEVEL 1
Anesthesia: General | Site: Back | Laterality: Bilateral

## 2022-08-03 ENCOUNTER — Telehealth: Payer: Self-pay | Admitting: *Deleted

## 2022-08-03 NOTE — Telephone Encounter (Signed)
Patient called to follow up on referral Ms. Thayil placed for mental health services on 07/17/22 as she has not heard from anyone.  Ms. Izetta Dakin referral is found in her notes and in orders, but it is not under referrals within chart and not indication where it was sent. Per Ms Mariane Baumgarten, please give patient number to contact [facility] and encourage her to call for appt: [phone]. Contacted Ms. Whiteman with this phone number. She plans to call them Monday. Advised her that if the office requires a referral or any information to ask them to contact this office. She verbalized understanding and said she will contact them.

## 2022-08-06 ENCOUNTER — Encounter (HOSPITAL_COMMUNITY): Admission: RE | Payer: Self-pay | Source: Ambulatory Visit

## 2022-08-06 ENCOUNTER — Inpatient Hospital Stay (HOSPITAL_COMMUNITY): Admission: RE | Admit: 2022-08-06 | Payer: Medicare HMO | Source: Ambulatory Visit | Admitting: Neurosurgery

## 2022-08-06 SURGERY — LAMINECTOMY WITH POSTERIOR LATERAL ARTHRODESIS LEVEL 1
Anesthesia: General | Site: Back | Laterality: Bilateral

## 2022-08-06 NOTE — Progress Notes (Signed)
Surgical Instructions    Your procedure is scheduled on Friday, June 7th, 2024.  Report to [facility] Main Entrance "A" at 11:00 o'clock A.M., then check in with the Admitting office.  Call this number if you have problems the morning of surgery:  [phone]   If you have any questions prior to your surgery date call [phone]: Open Monday-Friday 8am-4pm If you experience any cold or flu symptoms such as cough, fever, chills, shortness of breath, etc. between now and your scheduled surgery, please notify us at the above number     Remember:  Do not eat or drink after midnight the night before your surgery    Take these medicines the morning of surgery with A SIP OF WATER:   allopurinol (ZYLOPRIM)  methocarbamol (ROBAXIN)  metoprolol succinate (TOPROL-XL)  rosuvastatin (CRESTOR)   As needed:  HYDROcodone-acetaminophen (NORCO)   Follow your surgeon's instructions on when to stop Aspirin.  If no instructions were given by your surgeon then you will need to call the office to get those instructions.     As of today, STOP taking any Aspirin (unless otherwise instructed by your surgeon) Aleve, Naproxen, Ibuprofen, Motrin, Advil, Goody's, BC's, all herbal medications, fish oil, and all vitamins.   The day of surgery:          Do not wear jewelry or makeup. Do not wear lotions, powders, perfumes or deodorant. Do not shave 48 hours prior to surgery.   Do not bring valuables to the hospital. Do not wear nail polish, gel polish, artificial nails, or any other type of covering on natural nails (fingers and toes) If you have artificial nails or gel coating that need to be removed by a nail salon, please have this removed prior to surgery. Artificial nails or gel coating may interfere with anesthesia's ability to adequately monitor your vital signs.  [facility] is not responsible for any belongings or valuables.    Do NOT Smoke (Tobacco/Vaping)  24 hours prior to your procedure  If  you use a CPAP at night, you may bring your mask for your overnight stay.   Contacts, glasses, hearing aids, dentures or partials may not be worn into surgery, please bring cases for these belongings   For patients admitted to the hospital, discharge time will be determined by your treatment team.   Patients discharged the day of surgery will not be allowed to drive home, and someone needs to stay with them for 24 hours.   SURGICAL WAITING ROOM VISITATION Patients having surgery or a procedure may have no more than 2 support people in the waiting area - these visitors may rotate.   Children under the age of 64 must have an adult with them who is not the patient. If the patient needs to stay at the hospital during part of their recovery, the visitor guidelines for inpatient rooms apply. Pre-op nurse will coordinate an appropriate time for 1 support person to accompany patient in pre-op.  This support person may not rotate.   Please refer to [email or web address] for the visitor guidelines for Inpatients (after your surgery is over and you are in a regular room).    Special instructions:    Oral Hygiene is also important to reduce your risk of infection.  Remember - BRUSH YOUR TEETH THE MORNING OF SURGERY WITH YOUR REGULAR TOOTHPASTE     Pre-operative 5 CHG Bath Instructions   You can play a key role in reducing the risk of infection after surgery. Your  skin needs to be as free of germs as possible. You can reduce the number of germs on your skin by washing with CHG (chlorhexidine gluconate) soap before surgery. CHG is an antiseptic soap that kills germs and continues to kill germs even after washing.   DO NOT use if you have an allergy to chlorhexidine/CHG or antibacterial soaps. If your skin becomes reddened or irritated, stop using the CHG and notify one of our RNs at [phone].   Please shower with the CHG soap starting 4 days before  surgery using the following schedule:     Please keep in mind the following:  DO NOT shave, including legs and underarms, starting the day of your first shower.   You may shave your face at any point before/day of surgery.  Place clean sheets on your bed the day you start using CHG soap. Use a clean washcloth (not used since being washed) for each shower. DO NOT sleep with pets once you start using the CHG.   CHG Shower Instructions:  If you choose to wash your hair and private area, wash first with your normal shampoo/soap.  After you use shampoo/soap, rinse your hair and body thoroughly to remove shampoo/soap residue.  Turn the water OFF and apply about 3 tablespoons (45 ml) of CHG soap to a CLEAN washcloth.  Apply CHG soap ONLY FROM YOUR NECK DOWN TO YOUR TOES (washing for 3-5 minutes)  DO NOT use CHG soap on face, private areas, open wounds, or sores.  Pay special attention to the area where your surgery is being performed.  If you are having back surgery, having someone wash your back for you may be helpful. Wait 2 minutes after CHG soap is applied, then you may rinse off the CHG soap.  Pat dry with a clean towel  Put on clean clothes/pajamas   If you choose to wear lotion, please use ONLY the CHG-compatible lotions on the back of this paper.     Additional instructions for the day of surgery: DO NOT APPLY any lotions, deodorants, cologne, or perfumes.   Put on clean/comfortable clothes.  Brush your teeth.  Ask your nurse before applying any prescription medications to the skin.      CHG Compatible Lotions   Aveeno Moisturizing lotion  Cetaphil Moisturizing Cream  Cetaphil Moisturizing Lotion  Clairol Herbal Essence Moisturizing Lotion, Dry Skin  Clairol Herbal Essence Moisturizing Lotion, Extra Dry Skin  Clairol Herbal Essence Moisturizing Lotion, Normal Skin  Curel Age Defying Therapeutic Moisturizing Lotion with Alpha Hydroxy  Curel Extreme Care Body Lotion  Curel  Soothing Hands Moisturizing Hand Lotion  Curel Therapeutic Moisturizing Cream, Fragrance-Free  Curel Therapeutic Moisturizing Lotion, Fragrance-Free  Curel Therapeutic Moisturizing Lotion, Original Formula  Eucerin Daily Replenishing Lotion  Eucerin Dry Skin Therapy Plus Alpha Hydroxy Crme  Eucerin Dry Skin Therapy Plus Alpha Hydroxy Lotion  Eucerin Original Crme  Eucerin Original Lotion  Eucerin Plus Crme Eucerin Plus Lotion  Eucerin TriLipid Replenishing Lotion  Keri Anti-Bacterial Hand Lotion  Keri Deep Conditioning Original Lotion Dry Skin Formula Softly Scented  Keri Deep Conditioning Original Lotion, Fragrance Free Sensitive Skin Formula  Keri Lotion Fast Absorbing Fragrance Free Sensitive Skin Formula  Keri Lotion Fast Absorbing Softly Scented Dry Skin Formula  Keri Original Lotion  Keri Skin Renewal Lotion Keri Silky Smooth Lotion  Keri Silky Smooth Sensitive Skin Lotion  Nivea Body Creamy Conditioning Oil  Nivea Body Extra Enriched Regulatory affairs officer  Nivea Crme  Nivea Skin Firming Lotion  NutraDerm 30 Skin Lotion  NutraDerm Skin Lotion  NutraDerm Therapeutic Skin Cream  NutraDerm Therapeutic Skin Lotion  ProShield Protective Hand Cream  Provon moisturizing lotion   [place]- Preparing For Surgery  Before surgery, you can play an important role. Because skin is not sterile, your skin needs to be as free of germs as possible. You can reduce the number of germs on your skin by washing with CHG (chlorahexidine gluconate) Soap before surgery.  CHG is an antiseptic cleaner which kills germs and bonds with the skin to continue killing germs even after washing.     Please do not use if you have an allergy to CHG or antibacterial soaps. If your skin becomes reddened/irritated stop using the CHG.  Do not shave (including legs and underarms) for at least 48 hours prior to first CHG shower. It is OK to shave your  face.  Please follow these instructions carefully.     Shower the NIGHT BEFORE SURGERY and the MORNING OF SURGERY with CHG Soap.   If you chose to wash your hair, wash your hair first as usual with your normal shampoo. After you shampoo, rinse your hair and body thoroughly to remove the shampoo.  Then [employer] and genitals (private parts) with your normal soap and rinse thoroughly to remove soap.  After that Use CHG Soap as you would any other liquid soap. You can apply CHG directly to the skin and wash gently with a scrungie or a clean washcloth.   Apply the CHG Soap to your body ONLY FROM THE NECK DOWN.  Do not use on open wounds or open sores. Avoid contact with your eyes, ears, mouth and genitals (private parts). Wash Face and genitals (private parts)  with your normal soap.   Wash thoroughly, paying special attention to the area where your surgery will be performed.  Thoroughly rinse your body with warm water from the neck down.  DO NOT shower/wash with your normal soap after using and rinsing off the CHG Soap.  Pat yourself dry with a CLEAN TOWEL.  Wear CLEAN PAJAMAS to bed the night before surgery  Place CLEAN SHEETS on your bed the night before your surgery  DO NOT SLEEP WITH PETS.   Day of Surgery:  Take a shower with CHG soap. Wear Clean/Comfortable clothing the morning of surgery Do not apply any deodorants/lotions.   Remember to brush your teeth WITH YOUR REGULAR TOOTHPASTE.    If you received a COVID test during your pre-op visit, it is requested that you wear a mask when out in public, stay away from anyone that may not be feeling well, and notify your surgeon if you develop symptoms. If you have been in contact with anyone that has tested positive in the last 10 days, please notify your surgeon.    Please read over the following fact sheets that you were given.

## 2022-08-07 ENCOUNTER — Encounter (HOSPITAL_COMMUNITY)
Admission: RE | Admit: 2022-08-07 | Discharge: 2022-08-07 | Disposition: A | Discharge status: Home / Self Care | Payer: Medicare HMO | Source: Ambulatory Visit | Attending: Neurosurgery | Admitting: Neurosurgery

## 2022-08-07 ENCOUNTER — Other Ambulatory Visit: Payer: Self-pay | Admitting: Neurosurgery

## 2022-08-07 ENCOUNTER — Encounter (HOSPITAL_COMMUNITY): Payer: Self-pay

## 2022-08-07 ENCOUNTER — Other Ambulatory Visit: Payer: Self-pay

## 2022-08-07 VITALS — BP 130/73 | HR 62 | Temp 97.8°F | Resp 18 | Ht 65.0 in | Wt 196.8 lb

## 2022-08-07 DIAGNOSIS — Z01818 Encounter for other preprocedural examination: Secondary | ICD-10-CM | POA: Insufficient documentation

## 2022-08-07 DIAGNOSIS — E785 Hyperlipidemia, unspecified: Secondary | ICD-10-CM | POA: Insufficient documentation

## 2022-08-07 DIAGNOSIS — I6529 Occlusion and stenosis of unspecified carotid artery: Secondary | ICD-10-CM | POA: Insufficient documentation

## 2022-08-07 DIAGNOSIS — I10 Essential (primary) hypertension: Secondary | ICD-10-CM

## 2022-08-07 DIAGNOSIS — I480 Paroxysmal atrial fibrillation: Secondary | ICD-10-CM | POA: Insufficient documentation

## 2022-08-07 HISTORY — DX: Polyneuropathy, unspecified: G62.9

## 2022-08-07 LAB — SURGICAL PCR SCREEN
MRSA, PCR: NEGATIVE
Staphylococcus aureus: NEGATIVE

## 2022-08-07 LAB — TYPE AND SCREEN
ABO/RH(D): A POS
Antibody Screen: NEGATIVE

## 2022-08-07 NOTE — Progress Notes (Signed)
Surgical Instructions    Your procedure is scheduled on Friday, June 7th, 2024.  Report to [facility] Main Entrance "A" at 9:00 A.M., then check in with the Admitting office.  Call this number if you have problems the morning of surgery:  [phone]   If you have any questions prior to your surgery date call [phone]: Open Monday-Friday 8am-4pm If you experience any cold or flu symptoms such as cough, fever, chills, shortness of breath, etc. between now and your scheduled surgery, please notify us at the above number     Remember:  Do not eat or drink after midnight the night before your surgery    Take these medicines the morning of surgery with A SIP OF WATER:   allopurinol (ZYLOPRIM)  methocarbamol (ROBAXIN)  metoprolol succinate (TOPROL-XL)  rosuvastatin (CRESTOR)   As needed:  HYDROcodone-acetaminophen (NORCO)   Follow your surgeon's instructions on when to stop Aspirin.  If no instructions were given by your surgeon then you will need to call the office to get those instructions.     As of today, STOP taking any Aspirin (unless otherwise instructed by your surgeon) Aleve, Naproxen, Ibuprofen, Motrin, Advil, Goody's, BC's, all herbal medications, fish oil, and all vitamins.   The day of surgery:          Do not wear jewelry or makeup. Do not wear lotions, powders, perfumes or deodorant. Do not shave 48 hours prior to surgery.   Do not bring valuables to the hospital. Do not wear nail polish, gel polish, artificial nails, or any other type of covering on natural nails (fingers and toes) If you have artificial nails or gel coating that need to be removed by a nail salon, please have this removed prior to surgery. Artificial nails or gel coating may interfere with anesthesia's ability to adequately monitor your vital signs.  [facility] is not responsible for any belongings or valuables.    Do NOT Smoke (Tobacco/Vaping)  24 hours prior to your procedure  If you use  a CPAP at night, you may bring your mask for your overnight stay.   Contacts, glasses, hearing aids, dentures or partials may not be worn into surgery, please bring cases for these belongings   For patients admitted to the hospital, discharge time will be determined by your treatment team.   Patients discharged the day of surgery will not be allowed to drive home, and someone needs to stay with them for 24 hours.   SURGICAL WAITING ROOM VISITATION Patients having surgery or a procedure may have no more than 2 support people in the waiting area - these visitors may rotate.   Children under the age of 65 must have an adult with them who is not the patient. If the patient needs to stay at the hospital during part of their recovery, the visitor guidelines for inpatient rooms apply. Pre-op nurse will coordinate an appropriate time for 1 support person to accompany patient in pre-op.  This support person may not rotate.   Please refer to [email or web address] for the visitor guidelines for Inpatients (after your surgery is over and you are in a regular room).    Special instructions:    Oral Hygiene is also important to reduce your risk of infection.  Remember - BRUSH YOUR TEETH THE MORNING OF SURGERY WITH YOUR REGULAR TOOTHPASTE     Pre-operative 5 CHG Bath Instructions   You can play a key role in reducing the risk of infection after surgery. Your skin  needs to be as free of germs as possible. You can reduce the number of germs on your skin by washing with CHG (chlorhexidine gluconate) soap before surgery. CHG is an antiseptic soap that kills germs and continues to kill germs even after washing.   DO NOT use if you have an allergy to chlorhexidine/CHG or antibacterial soaps. If your skin becomes reddened or irritated, stop using the CHG and notify one of our RNs at [phone].   Please shower with the CHG soap starting 4 days before surgery  using the following schedule:     Please keep in mind the following:  DO NOT shave, including legs and underarms, starting the day of your first shower.   You may shave your face at any point before/day of surgery.  Place clean sheets on your bed the day you start using CHG soap. Use a clean washcloth (not used since being washed) for each shower. DO NOT sleep with pets once you start using the CHG.   CHG Shower Instructions:  If you choose to wash your hair and private area, wash first with your normal shampoo/soap.  After you use shampoo/soap, rinse your hair and body thoroughly to remove shampoo/soap residue.  Turn the water OFF and apply about 3 tablespoons (45 ml) of CHG soap to a CLEAN washcloth.  Apply CHG soap ONLY FROM YOUR NECK DOWN TO YOUR TOES (washing for 3-5 minutes)  DO NOT use CHG soap on face, private areas, open wounds, or sores.  Pay special attention to the area where your surgery is being performed.  If you are having back surgery, having someone wash your back for you may be helpful. Wait 2 minutes after CHG soap is applied, then you may rinse off the CHG soap.  Pat dry with a clean towel  Put on clean clothes/pajamas   If you choose to wear lotion, please use ONLY the CHG-compatible lotions on the back of this paper.     Additional instructions for the day of surgery: DO NOT APPLY any lotions, deodorants, cologne, or perfumes.   Put on clean/comfortable clothes.  Brush your teeth.  Ask your nurse before applying any prescription medications to the skin.      CHG Compatible Lotions   Aveeno Moisturizing lotion  Cetaphil Moisturizing Cream  Cetaphil Moisturizing Lotion  Clairol Herbal Essence Moisturizing Lotion, Dry Skin  Clairol Herbal Essence Moisturizing Lotion, Extra Dry Skin  Clairol Herbal Essence Moisturizing Lotion, Normal Skin  Curel Age Defying Therapeutic Moisturizing Lotion with Alpha Hydroxy  Curel Extreme Care Body Lotion  Curel Soothing  Hands Moisturizing Hand Lotion  Curel Therapeutic Moisturizing Cream, Fragrance-Free  Curel Therapeutic Moisturizing Lotion, Fragrance-Free  Curel Therapeutic Moisturizing Lotion, Original Formula  Eucerin Daily Replenishing Lotion  Eucerin Dry Skin Therapy Plus Alpha Hydroxy Crme  Eucerin Dry Skin Therapy Plus Alpha Hydroxy Lotion  Eucerin Original Crme  Eucerin Original Lotion  Eucerin Plus Crme Eucerin Plus Lotion  Eucerin TriLipid Replenishing Lotion  Keri Anti-Bacterial Hand Lotion  Keri Deep Conditioning Original Lotion Dry Skin Formula Softly Scented  Keri Deep Conditioning Original Lotion, Fragrance Free Sensitive Skin Formula  Keri Lotion Fast Absorbing Fragrance Free Sensitive Skin Formula  Keri Lotion Fast Absorbing Softly Scented Dry Skin Formula  Keri Original Lotion  Keri Skin Renewal Lotion Keri Silky Smooth Lotion  Keri Silky Smooth Sensitive Skin Lotion  Nivea Body Creamy Conditioning Oil  Nivea Body Extra Enriched Arts development officer  Crme  Nivea Skin Firming Lotion  NutraDerm 30 Skin Lotion  NutraDerm Skin Lotion  NutraDerm Therapeutic Skin Cream  NutraDerm Therapeutic Skin Lotion  ProShield Protective Hand Cream  Provon moisturizing lotion   [place]- Preparing For Surgery  Before surgery, you can play an important role. Because skin is not sterile, your skin needs to be as free of germs as possible. You can reduce the number of germs on your skin by washing with CHG (chlorahexidine gluconate) Soap before surgery.  CHG is an antiseptic cleaner which kills germs and bonds with the skin to continue killing germs even after washing.     Please do not use if you have an allergy to CHG or antibacterial soaps. If your skin becomes reddened/irritated stop using the CHG.  Do not shave (including legs and underarms) for at least 48 hours prior to first CHG shower. It is OK to shave your face.  Please  follow these instructions carefully.     Shower the NIGHT BEFORE SURGERY and the MORNING OF SURGERY with CHG Soap.   If you chose to wash your hair, wash your hair first as usual with your normal shampoo. After you shampoo, rinse your hair and body thoroughly to remove the shampoo.  Then [employer] and genitals (private parts) with your normal soap and rinse thoroughly to remove soap.  After that Use CHG Soap as you would any other liquid soap. You can apply CHG directly to the skin and wash gently with a scrungie or a clean washcloth.   Apply the CHG Soap to your body ONLY FROM THE NECK DOWN.  Do not use on open wounds or open sores. Avoid contact with your eyes, ears, mouth and genitals (private parts). Wash Face and genitals (private parts)  with your normal soap.   Wash thoroughly, paying special attention to the area where your surgery will be performed.  Thoroughly rinse your body with warm water from the neck down.  DO NOT shower/wash with your normal soap after using and rinsing off the CHG Soap.  Pat yourself dry with a CLEAN TOWEL.  Wear CLEAN PAJAMAS to bed the night before surgery  Place CLEAN SHEETS on your bed the night before your surgery  DO NOT SLEEP WITH PETS.   Day of Surgery:  Take a shower with CHG soap. Wear Clean/Comfortable clothing the morning of surgery Do not apply any deodorants/lotions.   Remember to brush your teeth WITH YOUR REGULAR TOOTHPASTE.    If you received a COVID test during your pre-op visit, it is requested that you wear a mask when out in public, stay away from anyone that may not be feeling well, and notify your surgeon if you develop symptoms. If you have been in contact with anyone that has tested positive in the last 10 days, please notify your surgeon.    Please read over the following fact sheets that you were given.

## 2022-08-07 NOTE — Progress Notes (Signed)
PCP - Darryll Capers, PA-C Cardiologist - Dr. Clotilde Dieter  PPM/ICD - denies   Chest x-ray - 06/27/20 EKG - 08/07/22 Stress Test - 08/06/18 ECHO - 07/11/20 Cardiac Cath - denies  Sleep Study - denies   DM- denies  Blood Thinner Instructions: n/a Aspirin Instructions: Last dose 5/23  ERAS Protcol - yes, no drink   COVID TEST- n/a   Anesthesia review: yes, cardiac hx  Patient denies shortness of breath, fever, cough and chest pain at PAT appointment   All instructions explained to the patient, with a verbal understanding of the material. Patient agrees to go over the instructions while at home for a better understanding. The opportunity to ask questions was provided.

## 2022-08-08 NOTE — Progress Notes (Signed)
Anesthesia Chart Review:  Follows with [facility] cardiology for history of hypertension, hyperlipidemia, bilateral leg edema, mild carotid stenosis, paroxysmal A-fib (noted on 72-hour event monitor May 2023, brief A-fib with overall burden <1%.  Not anticoagulated).  Last seen by Dr. Melton Alar 07/25/2022.  Patient reported increased palpitations at that time.  Upcoming surgery was also discussed.  Per note, "palpitations: Suspect this is due to stress of upcoming surgery.  Her EKG is unchanged from priors.  No ischemia or tachycardia noted on EKG."  CMP and CBC 07/17/2022 reviewed, unremarkable.   EKG 07/25/2022: normal sinus rhythm with LAE. Left axis with LAFB and iRBBB. Unchanged from prior EKGs    Carotid artery duplex 06/14/2021:  No evidence of significant stenosis in the right carotid vessels.  Duplex suggests stenosis in the left internal carotid artery (16-49%).  There is no significant plaque burden. This study may represent FMD (mild).  Antegrade right vertebral artery flow. Antegrade left vertebral artery flow.  No significant change 06/07/2020.   Ambulatory cardiac telemetry 3 days (07/24/2021 - 07/27/2021): Predominant underlying rhythm was sinus with first-degree AV block and IVCD.  Patient with occasional PACs and occasional PVCs which correlated with patient triggered events.  Patient did have newly noted episodes of atrial fibrillation with the longest lasting <2 minutes.  Overall A-fib burden <1%.  Minimum heart rate 47 bpm, maximum heart rate 127 bpm, average heart rate 63 bpm.    Carotid artery duplex 06/14/2021:  No evidence of significant stenosis in the right carotid vessels.  Duplex suggests stenosis in the left internal carotid artery (16-49%).  There is no significant plaque burden. This study may represent FMD (mild).  Antegrade right vertebral artery flow. Antegrade left vertebral artery flow.  No significant change 06/07/2020.   Echocardiogram 07/11/2020:  Left  ventricle cavity is normal in size. Mild concentric hypertrophy of  the left ventricle. Normal global wall motion. Normal LV systolic function with EF 59%. Doppler evidence of grade I (impaired) diastolic dysfunction, normal LAP.  Mild pulmonic regurgitation.  No evidence of pulmonary hypertension.   Marland KitchenLexiscan Myoview Stress Test 08/06/2018: Nondiagnostic ECG stress due to pharmacologic stress protocol. Normal myocardial perfusion.  Stress LV EF: 72%.  Low risk study.     Patricia Parks [facility] [place] [facility]/Anesthesiology Phone [phone] 08/08/2022 11:30 AM

## 2022-08-08 NOTE — Anesthesia Preprocedure Evaluation (Addendum)
Anesthesia Evaluation  Patient identified by MRN, date of birth, ID band Patient awake    Reviewed: Allergy & Precautions, NPO status , Patient's Chart, lab work & pertinent test results  History of Anesthesia Complications Negative for: history of anesthetic complications  Airway Mallampati: II  TM Distance: >3 FB Neck ROM: Full    Dental  (+) Dental Advisory Given   Pulmonary former smoker   breath sounds clear to auscultation       Cardiovascular hypertension, Pt. on medications and Pt. on home beta blockers (-) angina  Rhythm:Regular Rate:Normal  '22 ECHO:  Left ventricle cavity is normal in size. Mild concentric hypertrophy of  the left ventricle. Normal global wall motion. Normal LV systolic function  with EF 59%. Doppler evidence of grade I (impaired) diastolic dysfunction,  normal LAP.  Mild pulmonic regurgitation.  No evidence of pulmonary hypertension.   '20 stress: Nondiagnostic ECG stress due to pharmacologic stress protocol. Normal myocardial perfusion.  Stress LV EF: 72%.  Low risk study    Neuro/Psych   Anxiety Depression    negative neurological ROS     GI/Hepatic negative GI ROS, Neg liver ROS,,,  Endo/Other  Hypothyroidism    Renal/GU Renal InsufficiencyRenal disease     Musculoskeletal  (+) Arthritis ,  Fibromyalgia -  Abdominal  (+) + obese  Peds  Hematology   Anesthesia Other Findings   Reproductive/Obstetrics                              Anesthesia Physical Anesthesia Plan  ASA: 3  Anesthesia Plan: General   Post-op Pain Management: Ofirmev IV (intra-op)*   Induction: Intravenous  PONV Risk Score and Plan: 3 and Ondansetron, Dexamethasone and Treatment may vary due to age or medical condition  Airway Management Planned: Oral ETT  Additional Equipment: None  Intra-op Plan:   Post-operative Plan: Extubation in OR  Informed Consent: I have reviewed  the patients History and Physical, chart, labs and discussed the procedure including the risks, benefits and alternatives for the proposed anesthesia with the patient or authorized representative who has indicated his/her understanding and acceptance.     Dental advisory given  Plan Discussed with: CRNA and Surgeon  Anesthesia Plan Comments: (PAT note by Antionette Poles, PA-C: Follows with [facility] cardiology for history of hypertension, hyperlipidemia, bilateral leg edema, mild carotid stenosis, paroxysmal A-fib (noted on 72-hour event monitor May 2023, brief A-fib with overall burden <1%.  Not anticoagulated).  Last seen by Dr. Melton Alar 07/25/2022.  Patient reported increased palpitations at that time.  Upcoming surgery was also discussed.  Per note, "palpitations: Suspect this is due to stress of upcoming surgery.  Her EKG is unchanged from priors.  No ischemia or tachycardia noted on EKG."  CMP and CBC 07/17/2022 reviewed, unremarkable.   EKG 07/25/2022: normal sinus rhythm with LAE. Left axis with LAFB and iRBBB. Unchanged from prior EKGs    Carotid artery duplex 06/14/2021:  No evidence of significant stenosis in the right carotid vessels.  Duplex suggests stenosis in the left internal carotid artery (16-49%).  There is no significant plaque burden. This study may represent FMD (mild).  Antegrade right vertebral artery flow. Antegrade left vertebral artery flow.  No significant change 06/07/2020.   Ambulatory cardiac telemetry 3 days (07/24/2021 - 07/27/2021): Predominant underlying rhythm was sinus with first-degree AV block and IVCD.  Patient with occasional PACs and occasional PVCs which correlated with patient triggered events.  Patient  did have newly noted episodes of atrial fibrillation with the longest lasting <2 minutes.  Overall A-fib burden <1%.  Minimum heart rate 47 bpm, maximum heart rate 127 bpm, average heart rate 63 bpm.    Carotid artery duplex 06/14/2021:  No evidence of  significant stenosis in the right carotid vessels.  Duplex suggests stenosis in the left internal carotid artery (16-49%).  There is no significant plaque burden. This study may represent FMD (mild).  Antegrade right vertebral artery flow. Antegrade left vertebral artery flow.  No significant change 06/07/2020.   Echocardiogram 07/11/2020:  Left ventricle cavity is normal in size. Mild concentric hypertrophy of  the left ventricle. Normal global wall motion. Normal LV systolic function with EF 59%. Doppler evidence of grade I (impaired) diastolic dysfunction, normal LAP.  Mild pulmonic regurgitation.  No evidence of pulmonary hypertension.   Marland KitchenLexiscan Myoview Stress Test 08/06/2018: Nondiagnostic ECG stress due to pharmacologic stress protocol. Normal myocardial perfusion.  Stress LV EF: 72%.  Low risk study.  )         Anesthesia Quick Evaluation

## 2022-08-10 ENCOUNTER — Inpatient Hospital Stay (HOSPITAL_COMMUNITY): Payer: Medicare HMO | Admitting: Physician Assistant

## 2022-08-10 ENCOUNTER — Inpatient Hospital Stay (HOSPITAL_COMMUNITY): Payer: Medicare HMO

## 2022-08-10 ENCOUNTER — Encounter (HOSPITAL_COMMUNITY): Payer: Self-pay | Admitting: Neurosurgery

## 2022-08-10 ENCOUNTER — Encounter (HOSPITAL_COMMUNITY)
Admission: RE | Disposition: A | Discharge status: Home / Self Care | Payer: Self-pay | Source: Home / Self Care | Attending: Neurosurgery

## 2022-08-10 ENCOUNTER — Other Ambulatory Visit: Payer: Self-pay

## 2022-08-10 ENCOUNTER — Inpatient Hospital Stay (HOSPITAL_BASED_OUTPATIENT_CLINIC_OR_DEPARTMENT_OTHER): Payer: Medicare HMO | Admitting: Certified Registered"

## 2022-08-10 ENCOUNTER — Inpatient Hospital Stay (HOSPITAL_COMMUNITY)
Admission: RE | Admit: 2022-08-10 | Discharge: 2022-08-12 | DRG: 460 | Disposition: A | Discharge status: Home / Self Care | Payer: Medicare HMO | Attending: Neurosurgery | Admitting: Neurosurgery

## 2022-08-10 DIAGNOSIS — M109 Gout, unspecified: Secondary | ICD-10-CM | POA: Diagnosis present

## 2022-08-10 DIAGNOSIS — E039 Hypothyroidism, unspecified: Secondary | ICD-10-CM | POA: Diagnosis present

## 2022-08-10 DIAGNOSIS — T84226A Displacement of internal fixation device of vertebrae, initial encounter: Secondary | ICD-10-CM | POA: Diagnosis present

## 2022-08-10 DIAGNOSIS — I6522 Occlusion and stenosis of left carotid artery: Secondary | ICD-10-CM | POA: Diagnosis present

## 2022-08-10 DIAGNOSIS — Z87891 Personal history of nicotine dependence: Secondary | ICD-10-CM

## 2022-08-10 DIAGNOSIS — M960 Pseudarthrosis after fusion or arthrodesis: Secondary | ICD-10-CM

## 2022-08-10 DIAGNOSIS — M797 Fibromyalgia: Secondary | ICD-10-CM | POA: Diagnosis present

## 2022-08-10 DIAGNOSIS — Z8262 Family history of osteoporosis: Secondary | ICD-10-CM | POA: Diagnosis not present

## 2022-08-10 DIAGNOSIS — S32009K Unspecified fracture of unspecified lumbar vertebra, subsequent encounter for fracture with nonunion: Principal | ICD-10-CM | POA: Diagnosis present

## 2022-08-10 DIAGNOSIS — Z79899 Other long term (current) drug therapy: Secondary | ICD-10-CM | POA: Diagnosis not present

## 2022-08-10 DIAGNOSIS — I480 Paroxysmal atrial fibrillation: Secondary | ICD-10-CM | POA: Diagnosis present

## 2022-08-10 DIAGNOSIS — Z803 Family history of malignant neoplasm of breast: Secondary | ICD-10-CM

## 2022-08-10 DIAGNOSIS — Z87410 Personal history of cervical dysplasia: Secondary | ICD-10-CM

## 2022-08-10 DIAGNOSIS — Z833 Family history of diabetes mellitus: Secondary | ICD-10-CM | POA: Diagnosis not present

## 2022-08-10 DIAGNOSIS — Z7982 Long term (current) use of aspirin: Secondary | ICD-10-CM | POA: Diagnosis not present

## 2022-08-10 DIAGNOSIS — F418 Other specified anxiety disorders: Secondary | ICD-10-CM

## 2022-08-10 DIAGNOSIS — I10 Essential (primary) hypertension: Secondary | ICD-10-CM

## 2022-08-10 DIAGNOSIS — Z8049 Family history of malignant neoplasm of other genital organs: Secondary | ICD-10-CM

## 2022-08-10 DIAGNOSIS — E785 Hyperlipidemia, unspecified: Secondary | ICD-10-CM | POA: Diagnosis present

## 2022-08-10 DIAGNOSIS — Y798 Miscellaneous orthopedic devices associated with adverse incidents, not elsewhere classified: Secondary | ICD-10-CM | POA: Diagnosis present

## 2022-08-10 DIAGNOSIS — Z8249 Family history of ischemic heart disease and other diseases of the circulatory system: Secondary | ICD-10-CM

## 2022-08-10 HISTORY — PX: LAMINECTOMY WITH POSTERIOR LATERAL ARTHRODESIS LEVEL 1: SHX6335

## 2022-08-10 SURGERY — LAMINECTOMY WITH POSTERIOR LATERAL ARTHRODESIS LEVEL 1
Anesthesia: General | Site: Back | Laterality: Bilateral

## 2022-08-10 MED ORDER — LIDOCAINE 2% (20 MG/ML) 5 ML SYRINGE
INTRAMUSCULAR | Status: DC | PRN
  Administered 2022-08-10: 50 mg via INTRAVENOUS
  Administered 2022-08-10: 1 mg/kg/h via INTRAVENOUS

## 2022-08-10 MED ORDER — PANTOPRAZOLE SODIUM 40 MG PO TBEC
40.0000 mg | DELAYED_RELEASE_TABLET | Freq: Every day | ORAL | Status: DC
  Administered 2022-08-10 – 2022-08-11 (×2): 40 mg via ORAL
  Filled 2022-08-10 (×2): qty 1

## 2022-08-10 MED ORDER — OXYCODONE HCL 5 MG PO TABS: 5.0000 mg | ORAL_TABLET | Freq: Once | ORAL | Status: DC | PRN

## 2022-08-10 MED ORDER — PROPOFOL 500 MG/50ML IV EMUL
INTRAVENOUS | Status: DC | PRN
  Administered 2022-08-10: 15 ug/kg/min via INTRAVENOUS

## 2022-08-10 MED ORDER — TRANEXAMIC ACID-NACL 1000-0.7 MG/100ML-% IV SOLN
INTRAVENOUS | Status: AC
  Filled 2022-08-10: qty 100

## 2022-08-10 MED ORDER — HYDROCODONE-ACETAMINOPHEN 10-325 MG PO TABS
1.0000 | ORAL_TABLET | ORAL | Status: DC | PRN
  Administered 2022-08-10 – 2022-08-12 (×9): 1 via ORAL
  Filled 2022-08-10 (×9): qty 1

## 2022-08-10 MED ORDER — PHENOL 1.4 % MT LIQD: 1.0000 | OROMUCOSAL | Status: DC | PRN

## 2022-08-10 MED ORDER — DEXAMETHASONE SODIUM PHOSPHATE 10 MG/ML IJ SOLN
INTRAMUSCULAR | Status: AC
  Filled 2022-08-10: qty 1

## 2022-08-10 MED ORDER — BUPIVACAINE HCL (PF) 0.25 % IJ SOLN
INTRAMUSCULAR | Status: AC
  Filled 2022-08-10: qty 30

## 2022-08-10 MED ORDER — DIPHENHYDRAMINE HCL 25 MG PO CAPS: 25.0000 mg | ORAL_CAPSULE | Freq: Four times a day (QID) | ORAL | Status: DC | PRN

## 2022-08-10 MED ORDER — THROMBIN 20000 UNITS EX SOLR
CUTANEOUS | Status: AC
  Filled 2022-08-10: qty 20000

## 2022-08-10 MED ORDER — HYDROMORPHONE HCL 1 MG/ML IJ SOLN
0.5000 mg | INTRAMUSCULAR | Status: DC | PRN
  Administered 2022-08-11: 0.5 mg via INTRAVENOUS
  Filled 2022-08-10: qty 0.5

## 2022-08-10 MED ORDER — PHENYLEPHRINE 80 MCG/ML (10ML) SYRINGE FOR IV PUSH (FOR BLOOD PRESSURE SUPPORT)
PREFILLED_SYRINGE | INTRAVENOUS | Status: AC
  Filled 2022-08-10: qty 10

## 2022-08-10 MED ORDER — MIDAZOLAM HCL 2 MG/2ML IJ SOLN: 0.5000 mg | Freq: Once | INTRAMUSCULAR | Status: DC | PRN

## 2022-08-10 MED ORDER — FENTANYL CITRATE (PF) 250 MCG/5ML IJ SOLN
INTRAMUSCULAR | Status: AC
  Filled 2022-08-10: qty 5

## 2022-08-10 MED ORDER — HYDROMORPHONE HCL 1 MG/ML IJ SOLN
0.2500 mg | INTRAMUSCULAR | Status: DC | PRN
  Administered 2022-08-10 (×2): 0.5 mg via INTRAVENOUS

## 2022-08-10 MED ORDER — PHENYLEPHRINE 80 MCG/ML (10ML) SYRINGE FOR IV PUSH (FOR BLOOD PRESSURE SUPPORT)
PREFILLED_SYRINGE | INTRAVENOUS | Status: DC | PRN
  Administered 2022-08-10: 80 ug via INTRAVENOUS

## 2022-08-10 MED ORDER — SODIUM CHLORIDE 0.9 % IV SOLN
250.0000 mL | INTRAVENOUS | Status: DC
  Administered 2022-08-10: 250 mL via INTRAVENOUS

## 2022-08-10 MED ORDER — OXYCODONE HCL 5 MG/5ML PO SOLN: 5.0000 mg | Freq: Once | ORAL | Status: DC | PRN

## 2022-08-10 MED ORDER — BUPIVACAINE LIPOSOME 1.3 % IJ SUSP
INTRAMUSCULAR | Status: AC
  Filled 2022-08-10: qty 20

## 2022-08-10 MED ORDER — EPHEDRINE SULFATE-NACL 50-0.9 MG/10ML-% IV SOSY
PREFILLED_SYRINGE | INTRAVENOUS | Status: DC | PRN
  Administered 2022-08-10 (×2): 5 mg via INTRAVENOUS

## 2022-08-10 MED ORDER — PHENYLEPHRINE HCL-NACL 20-0.9 MG/250ML-% IV SOLN
INTRAVENOUS | Status: DC | PRN
  Administered 2022-08-10: 15 ug/min via INTRAVENOUS

## 2022-08-10 MED ORDER — LOSARTAN POTASSIUM 50 MG PO TABS
100.0000 mg | ORAL_TABLET | Freq: Every evening | ORAL | Status: DC
  Administered 2022-08-10 – 2022-08-11 (×2): 100 mg via ORAL
  Filled 2022-08-10 (×2): qty 2

## 2022-08-10 MED ORDER — CYCLOBENZAPRINE HCL 10 MG PO TABS
10.0000 mg | ORAL_TABLET | Freq: Three times a day (TID) | ORAL | Status: DC | PRN
  Administered 2022-08-12: 10 mg via ORAL
  Filled 2022-08-10: qty 1

## 2022-08-10 MED ORDER — CEFAZOLIN SODIUM-DEXTROSE 2-4 GM/100ML-% IV SOLN
2.0000 g | Freq: Three times a day (TID) | INTRAVENOUS | Status: AC
  Administered 2022-08-10 – 2022-08-12 (×6): 2 g via INTRAVENOUS
  Filled 2022-08-10 (×6): qty 100

## 2022-08-10 MED ORDER — CHLORHEXIDINE GLUCONATE 0.12 % MT SOLN
15.0000 mL | Freq: Once | OROMUCOSAL | Status: AC
  Administered 2022-08-10: 15 mL via OROMUCOSAL
  Filled 2022-08-10: qty 15

## 2022-08-10 MED ORDER — THROMBIN 20000 UNITS EX SOLR: CUTANEOUS | Status: DC | PRN

## 2022-08-10 MED ORDER — ROSUVASTATIN CALCIUM 5 MG PO TABS
10.0000 mg | ORAL_TABLET | Freq: Every day | ORAL | Status: DC
  Administered 2022-08-11 – 2022-08-12 (×2): 10 mg via ORAL
  Filled 2022-08-10 (×2): qty 2

## 2022-08-10 MED ORDER — TOBRAMYCIN 0.3 % OP SOLN
1.0000 [drp] | OPHTHALMIC | Status: DC
  Filled 2022-08-10: qty 5

## 2022-08-10 MED ORDER — METHOCARBAMOL 500 MG PO TABS
500.0000 mg | ORAL_TABLET | Freq: Four times a day (QID) | ORAL | Status: DC
  Administered 2022-08-10 – 2022-08-12 (×9): 500 mg via ORAL
  Filled 2022-08-10 (×9): qty 1

## 2022-08-10 MED ORDER — CHLORTHALIDONE 25 MG PO TABS
12.5000 mg | ORAL_TABLET | Freq: Every day | ORAL | Status: DC
  Administered 2022-08-10 – 2022-08-12 (×3): 12.5 mg via ORAL
  Filled 2022-08-10 (×2): qty 0.5
  Filled 2022-08-10: qty 1

## 2022-08-10 MED ORDER — ROCURONIUM BROMIDE 10 MG/ML (PF) SYRINGE
PREFILLED_SYRINGE | INTRAVENOUS | Status: DC | PRN
  Administered 2022-08-10: 70 mg via INTRAVENOUS

## 2022-08-10 MED ORDER — FENTANYL CITRATE (PF) 250 MCG/5ML IJ SOLN
INTRAMUSCULAR | Status: DC | PRN
  Administered 2022-08-10: 50 ug via INTRAVENOUS
  Administered 2022-08-10: 200 ug via INTRAVENOUS

## 2022-08-10 MED ORDER — HYDROMORPHONE HCL 1 MG/ML IJ SOLN
INTRAMUSCULAR | Status: AC
  Filled 2022-08-10: qty 1

## 2022-08-10 MED ORDER — PROPOFOL 10 MG/ML IV BOLUS
INTRAVENOUS | Status: AC
  Filled 2022-08-10: qty 20

## 2022-08-10 MED ORDER — ALLOPURINOL 100 MG PO TABS
100.0000 mg | ORAL_TABLET | Freq: Every day | ORAL | Status: DC
  Administered 2022-08-11 – 2022-08-12 (×2): 100 mg via ORAL
  Filled 2022-08-10 (×2): qty 1

## 2022-08-10 MED ORDER — CEFAZOLIN SODIUM-DEXTROSE 2-3 GM-%(50ML) IV SOLR
INTRAVENOUS | Status: DC | PRN
  Administered 2022-08-10: 2 g via INTRAVENOUS

## 2022-08-10 MED ORDER — ACETAMINOPHEN 650 MG RE SUPP: 650.0000 mg | RECTAL | Status: DC | PRN

## 2022-08-10 MED ORDER — MEPERIDINE HCL 25 MG/ML IJ SOLN: 6.2500 mg | INTRAMUSCULAR | Status: DC | PRN

## 2022-08-10 MED ORDER — ONDANSETRON HCL 4 MG/2ML IJ SOLN
INTRAMUSCULAR | Status: AC
  Filled 2022-08-10: qty 2

## 2022-08-10 MED ORDER — ASPIRIN 81 MG PO TBEC
81.0000 mg | DELAYED_RELEASE_TABLET | Freq: Every day | ORAL | Status: DC
  Administered 2022-08-11 – 2022-08-12 (×2): 81 mg via ORAL
  Filled 2022-08-10 (×2): qty 1

## 2022-08-10 MED ORDER — ROCURONIUM BROMIDE 10 MG/ML (PF) SYRINGE
PREFILLED_SYRINGE | INTRAVENOUS | Status: AC
  Filled 2022-08-10: qty 10

## 2022-08-10 MED ORDER — LIDOCAINE-EPINEPHRINE 1 %-1:100000 IJ SOLN
INTRAMUSCULAR | Status: AC
  Filled 2022-08-10: qty 1

## 2022-08-10 MED ORDER — PANTOPRAZOLE SODIUM 40 MG IV SOLR: 40.0000 mg | Freq: Every day | INTRAVENOUS | Status: DC

## 2022-08-10 MED ORDER — ONDANSETRON HCL 4 MG PO TABS: 4.0000 mg | ORAL_TABLET | Freq: Four times a day (QID) | ORAL | Status: DC | PRN

## 2022-08-10 MED ORDER — ONDANSETRON HCL 4 MG/2ML IJ SOLN
INTRAMUSCULAR | Status: DC | PRN
  Administered 2022-08-10: 4 mg via INTRAVENOUS

## 2022-08-10 MED ORDER — BUPIVACAINE LIPOSOME 1.3 % IJ SUSP
INTRAMUSCULAR | Status: DC | PRN
  Administered 2022-08-10: 20 mL

## 2022-08-10 MED ORDER — LIDOCAINE 2% (20 MG/ML) 5 ML SYRINGE
INTRAMUSCULAR | Status: AC
  Filled 2022-08-10: qty 5

## 2022-08-10 MED ORDER — DEXMEDETOMIDINE HCL IN NACL 80 MCG/20ML IV SOLN
INTRAVENOUS | Status: AC
  Filled 2022-08-10: qty 20

## 2022-08-10 MED ORDER — PROMETHAZINE HCL 25 MG/ML IJ SOLN: 6.2500 mg | INTRAMUSCULAR | Status: DC | PRN

## 2022-08-10 MED ORDER — MENTHOL 3 MG MT LOZG: 1.0000 | LOZENGE | OROMUCOSAL | Status: DC | PRN

## 2022-08-10 MED ORDER — ORAL CARE MOUTH RINSE: 15.0000 mL | Freq: Once | OROMUCOSAL | Status: AC

## 2022-08-10 MED ORDER — SODIUM CHLORIDE 0.9% FLUSH
3.0000 mL | Freq: Two times a day (BID) | INTRAVENOUS | Status: DC
  Administered 2022-08-10 – 2022-08-12 (×5): 3 mL via INTRAVENOUS

## 2022-08-10 MED ORDER — METOPROLOL SUCCINATE ER 50 MG PO TB24
50.0000 mg | ORAL_TABLET | Freq: Every morning | ORAL | Status: DC
  Administered 2022-08-11 – 2022-08-12 (×2): 50 mg via ORAL
  Filled 2022-08-10 (×2): qty 1

## 2022-08-10 MED ORDER — LACTATED RINGERS IV SOLN: INTRAVENOUS | Status: DC

## 2022-08-10 MED ORDER — ACETAMINOPHEN 325 MG PO TABS
650.0000 mg | ORAL_TABLET | ORAL | Status: DC | PRN
  Administered 2022-08-10 – 2022-08-11 (×2): 650 mg via ORAL
  Filled 2022-08-10 (×2): qty 2

## 2022-08-10 MED ORDER — HYDROMORPHONE HCL 1 MG/ML IJ SOLN
INTRAMUSCULAR | Status: DC | PRN
  Administered 2022-08-10: .3 mg via INTRAVENOUS
  Administered 2022-08-10: .2 mg via INTRAVENOUS

## 2022-08-10 MED ORDER — LIDOCAINE-EPINEPHRINE 1 %-1:100000 IJ SOLN
INTRAMUSCULAR | Status: DC | PRN
  Administered 2022-08-10: 10 mL

## 2022-08-10 MED ORDER — HYDROMORPHONE HCL 1 MG/ML IJ SOLN
INTRAMUSCULAR | Status: AC
  Filled 2022-08-10: qty 0.5

## 2022-08-10 MED ORDER — SUGAMMADEX SODIUM 200 MG/2ML IV SOLN
INTRAVENOUS | Status: DC | PRN
  Administered 2022-08-10 (×2): 100 mg via INTRAVENOUS

## 2022-08-10 MED ORDER — GLYCOPYRROLATE PF 0.2 MG/ML IJ SOSY
PREFILLED_SYRINGE | INTRAMUSCULAR | Status: AC
  Filled 2022-08-10: qty 1

## 2022-08-10 MED ORDER — ONDANSETRON HCL 4 MG/2ML IJ SOLN: 4.0000 mg | Freq: Four times a day (QID) | INTRAMUSCULAR | Status: DC | PRN

## 2022-08-10 MED ORDER — EPHEDRINE 5 MG/ML INJ
INTRAVENOUS | Status: AC
  Filled 2022-08-10: qty 5

## 2022-08-10 MED ORDER — DEXAMETHASONE SODIUM PHOSPHATE 10 MG/ML IJ SOLN
INTRAMUSCULAR | Status: DC | PRN
  Administered 2022-08-10: 4 mg via INTRAVENOUS

## 2022-08-10 MED ORDER — PROPOFOL 10 MG/ML IV BOLUS
INTRAVENOUS | Status: DC | PRN
  Administered 2022-08-10: 140 mg via INTRAVENOUS

## 2022-08-10 MED ORDER — 0.9 % SODIUM CHLORIDE (POUR BTL) OPTIME
TOPICAL | Status: DC | PRN
  Administered 2022-08-10: 2000 mL

## 2022-08-10 MED ORDER — ALUM & MAG HYDROXIDE-SIMETH 200-200-20 MG/5ML PO SUSP: 30.0000 mL | Freq: Four times a day (QID) | ORAL | Status: DC | PRN

## 2022-08-10 MED ORDER — SODIUM CHLORIDE 0.9% FLUSH: 3.0000 mL | INTRAVENOUS | Status: DC | PRN

## 2022-08-10 SURGICAL SUPPLY — 70 items
ADH SKN CLS APL DERMABOND .7 (GAUZE/BANDAGES/DRESSINGS) ×1
APL SKNCLS STERI-STRIP NONHPOA (GAUZE/BANDAGES/DRESSINGS) ×1
BAG COUNTER SPONGE SURGICOUNT (BAG) ×1 IMPLANT
BAG SPNG CNTER NS LX DISP (BAG) ×1
BENZOIN TINCTURE PRP APPL 2/3 (GAUZE/BANDAGES/DRESSINGS) ×1 IMPLANT
BLADE CLIPPER SURG (BLADE) IMPLANT
BLADE SURG 11 STRL SS (BLADE) ×1 IMPLANT
BONE FIBERS PLIAFX 10 (Bone Implant) ×1 IMPLANT
BONE VIVIGEN FORMABLE 10CC (Bone Implant) ×1 IMPLANT
BUR MATCHSTICK NEURO 3.0 LAGG (BURR) ×1 IMPLANT
BUR PRECISION FLUTE 6.0 (BURR) ×1 IMPLANT
CANISTER SUCT 3000ML PPV (MISCELLANEOUS) ×1 IMPLANT
CAP LOCKING THREADED (Cap) IMPLANT
CNTNR URN SCR LID CUP LEK RST (MISCELLANEOUS) ×1 IMPLANT
CONT SPEC 4OZ STRL OR WHT (MISCELLANEOUS) ×1
COVER BACK TABLE 24X17X13 BIG (DRAPES) IMPLANT
COVER BACK TABLE 60X90IN (DRAPES) ×1 IMPLANT
DERMABOND ADVANCED .7 DNX12 (GAUZE/BANDAGES/DRESSINGS) ×1 IMPLANT
DRAPE C-ARM 42X72 X-RAY (DRAPES) ×2 IMPLANT
DRAPE HALF SHEET 40X57 (DRAPES) IMPLANT
DRAPE LAPAROTOMY 100X72X124 (DRAPES) ×1 IMPLANT
DRAPE POUCH INSTRU U-SHP 10X18 (DRAPES) ×1 IMPLANT
DRAPE SURG 17X23 STRL (DRAPES) ×1 IMPLANT
DRSG OPSITE POSTOP 4X8 (GAUZE/BANDAGES/DRESSINGS) IMPLANT
DURAPREP 26ML APPLICATOR (WOUND CARE) ×1 IMPLANT
ELECT REM PT RETURN 9FT ADLT (ELECTROSURGICAL) ×1
ELECTRODE REM PT RTRN 9FT ADLT (ELECTROSURGICAL) ×1 IMPLANT
EVACUATOR 1/8 PVC DRAIN (DRAIN) IMPLANT
EVACUATOR 3/16 PVC DRAIN (DRAIN) ×1 IMPLANT
GAUZE 4X4 16PLY ~~LOC~~+RFID DBL (SPONGE) IMPLANT
GAUZE SPONGE 4X4 12PLY STRL (GAUZE/BANDAGES/DRESSINGS) ×1 IMPLANT
GLOVE BIO SURGEON STRL SZ7 (GLOVE) IMPLANT
GLOVE BIO SURGEON STRL SZ8 (GLOVE) ×2 IMPLANT
GLOVE BIOGEL PI IND STRL 7.0 (GLOVE) IMPLANT
GLOVE EXAM NITRILE XL STR (GLOVE) IMPLANT
GLOVE INDICATOR 8.5 STRL (GLOVE) ×4 IMPLANT
GOWN STRL REUS W/ TWL LRG LVL3 (GOWN DISPOSABLE) IMPLANT
GOWN STRL REUS W/ TWL XL LVL3 (GOWN DISPOSABLE) ×2 IMPLANT
GOWN STRL REUS W/TWL 2XL LVL3 (GOWN DISPOSABLE) IMPLANT
GOWN STRL REUS W/TWL LRG LVL3 (GOWN DISPOSABLE)
GOWN STRL REUS W/TWL XL LVL3 (GOWN DISPOSABLE) ×2
GRAFT BNE FBR PLIAFX PRIME 10 (Bone Implant) IMPLANT
GRAFT BNE MATRIX VG FRMBL L 10 (Bone Implant) IMPLANT
KIT BASIN OR (CUSTOM PROCEDURE TRAY) ×1 IMPLANT
KIT INFUSE SMALL (Orthopedic Implant) IMPLANT
KIT TURNOVER KIT B (KITS) ×1 IMPLANT
NDL HYPO 25X1 1.5 SAFETY (NEEDLE) ×1 IMPLANT
NEEDLE HYPO 25X1 1.5 SAFETY (NEEDLE) ×1 IMPLANT
NS IRRIG 1000ML POUR BTL (IV SOLUTION) ×1 IMPLANT
PACK LAMINECTOMY NEURO (CUSTOM PROCEDURE TRAY) ×1 IMPLANT
PAD ARMBOARD 7.5X6 YLW CONV (MISCELLANEOUS) ×3 IMPLANT
ROD 40MM SPINAL (Rod) IMPLANT
ROD SPNL 5.5 CREO TI 65 (Rod) IMPLANT
SCREW CREO 7.5X40MM (Screw) IMPLANT
SCREW CREO SHAFT 8.5X40 (Screw) IMPLANT
SCREW PA THRD CREO TULIP 5.5X4 (Head) IMPLANT
SOL ELECTROSURG ANTI STICK (MISCELLANEOUS)
SOLUTION ELECTROSURG ANTI STCK (MISCELLANEOUS) ×1 IMPLANT
SPIKE FLUID TRANSFER (MISCELLANEOUS) ×1 IMPLANT
SPONGE SURGIFOAM ABS GEL 100 (HEMOSTASIS) ×1 IMPLANT
SPONGE T-LAP 4X18 ~~LOC~~+RFID (SPONGE) IMPLANT
STRIP CLOSURE SKIN 1/2X4 (GAUZE/BANDAGES/DRESSINGS) ×2 IMPLANT
SUT VIC AB 0 CT1 18XCR BRD8 (SUTURE) ×2 IMPLANT
SUT VIC AB 0 CT1 8-18 (SUTURE) ×2
SUT VIC AB 2-0 CT1 18 (SUTURE) ×1 IMPLANT
SUT VICRYL 4-0 PS2 18IN ABS (SUTURE) ×1 IMPLANT
TOWEL GREEN STERILE (TOWEL DISPOSABLE) ×1 IMPLANT
TOWEL GREEN STERILE FF (TOWEL DISPOSABLE) ×1 IMPLANT
TRAY FOLEY MTR SLVR 16FR STAT (SET/KITS/TRAYS/PACK) ×1 IMPLANT
WATER STERILE IRR 1000ML POUR (IV SOLUTION) ×1 IMPLANT

## 2022-08-10 NOTE — Transfer of Care (Signed)
Immediate Anesthesia Transfer of Care Note  Patient: Patricia Parks  Procedure(s) Performed: Reexploration of Fusion Lumbar Five- Sacral One with Redo Postero-Lateral Fusion Replacement of Hardware and Posterio Lateral Arthodesis (Bilateral: Back)  Patient Location: PACU  Anesthesia Type:General  Level of Consciousness: awake, alert , oriented, and patient cooperative  Airway & Oxygen Therapy: Patient Spontanous Breathing and Patient connected to face mask oxygen  Post-op Assessment: Report given to RN and Post -op Vital signs reviewed and stable  Post vital signs: Reviewed and stable  Last Vitals:  Vitals Value Taken Time  BP 148/67 08/10/22 1146  Temp    Pulse 67 08/10/22 1149  Resp 17 08/10/22 1149  SpO2 100 % 08/10/22 1149  Vitals shown include unvalidated device data.  Last Pain:  Vitals:   08/10/22 0811  TempSrc:   PainSc: 3       Patients Stated Pain Goal: 0 (08/10/22 0811)  Complications: No notable events documented.

## 2022-08-10 NOTE — H&P (Signed)
Patricia Parks is an 77 y.o. female.   Chief Complaint: Back and left greater than right leg pain HPI: 77 year old female previously undergone lumbar fusion set progressive worsening back pain worse on the left with some radiation down both legs consistent with L5 and S1.  Workup revealed pseudoarthrosis L5-S1 with definite loosening of left S1 screw probable right L5 screw and possible loosening of left S1 as well.  Due to patient's progression of clinical syndrome imaging findings of a conservative treatment I recommended reexploration of fusion removal of hardware and replacement of both S1 screws and possible replacement of right L5 screw.  I extensively gone over the risks and benefits of that operation with her as well as perioperative course expectations of outcome and alternatives to surgery and she understood and agreed to proceed forward.  Past Medical History:  Diagnosis Date   Anxiety    Arthritis    Depression    Fatty liver    Fibromyalgia    Gout 04/2017   Hyperlipidemia    Hypertension    Hypothyroidism    pt no longer takes meds- just monitoring thyroid levels   Low kidney function    followed by Dr Nicholos Johns   Neuropathy    in both feet   Spinal stenosis     Past Surgical History:  Procedure Laterality Date   BACK SURGERY  09/13/2021   Carpel Tunnel Surgery Right 10/17/2018   both wrists   CATARACT EXTRACTION W/ INTRAOCULAR LENS IMPLANT Bilateral 10/25/2015   Right eye was first.  Left eye done 10/17.   CERVICAL BIOPSY  W/ LOOP ELECTRODE EXCISION  12/2008   hx CIN II   COLONOSCOPY     LUMBAR FUSION  2021   RADIOLOGY WITH ANESTHESIA N/A 07/26/2020   Procedure: MRI WITH ANESTHESIA  LUMBAR WITH AND WITHOUT CONTRAST;  Surgeon: Radiologist, Medication, MD;  Location: [facility] OR;  Service: Radiology;  Laterality: N/A;    Family History  Problem Relation Age of Onset   Osteoporosis Mother    Hypertension Mother    Uterine cancer Mother    Heart disease Mother         Atrial fibrillation   Breast cancer Sister 3   Osteoporosis Sister    Diabetes Sister    Diabetes Father    Heart disease Sister    Social History:  reports that she quit smoking about 49 years ago. Her smoking use included cigarettes. She has a 1.25 pack-year smoking history. She has never used smokeless tobacco. She reports current alcohol use. She reports that she does not use drugs.  Allergies: No Known Allergies  Medications Prior to Admission  Medication Sig Dispense Refill   Aflibercept (EYLEA IO) Place 1 Syringe into the left eye See admin instructions. Every 12 weeks     allopurinol (ZYLOPRIM) 100 MG tablet Take 100 mg by mouth in the morning.     aspirin EC 81 MG tablet Take 1 tablet (81 mg total) by mouth daily. Swallow whole. 90 tablet 3   chlorthalidone (HYGROTON) 25 MG tablet TAKE 1/2 TABLET(12.5 MG) BY MOUTH DAILY 45 tablet 3   HYDROcodone-acetaminophen (NORCO) 10-325 MG tablet Take 1 tablet by mouth in the morning, at noon, in the evening, and at bedtime.     losartan (COZAAR) 100 MG tablet Take 1 tablet (100 mg total) by mouth daily. (Patient taking differently: Take 100 mg by mouth every evening.) 90 tablet 3   methocarbamol (ROBAXIN) 500 MG tablet Take 500 mg by mouth  4 (four) times daily.     metoprolol succinate (TOPROL-XL) 50 MG 24 hr tablet Take 50 mg by mouth in the morning. Take with or immediately following a meal.     rosuvastatin (CRESTOR) 10 MG tablet TAKE 1 TABLET(10 MG) BY MOUTH DAILY 90 tablet 3   tobramycin (TOBREX) 0.3 % ophthalmic solution Place 1 drop into the left eye as directed. Instill 1 drop into left eye 4 times daily--the day before, the day of, and the day after eylea eye injection.     diclofenac Sodium (VOLTAREN) 1 % GEL Apply 1 Application topically 4 (four) times daily as needed (pain.).     OVER THE COUNTER MEDICATION Apply 1 application  topically 2 (two) times daily as needed (pain). CBD Oil for back and feet pain      No results found  for this or any previous visit (from the past 48 hour(s)). No results found.  Review of Systems  Musculoskeletal:  Positive for back pain.  Neurological:  Positive for numbness.    Blood pressure (!) 148/82, pulse 65, temperature 97.9 F (36.6 C), temperature source Oral, resp. rate 18, height 5\' 5"  (1.651 m), weight 89.2 kg, last menstrual period 03/05/2000, SpO2 97 %. Physical Exam HENT:     Head: Normocephalic.     Right Ear: Tympanic membrane normal.     Nose: Nose normal.     Mouth/Throat:     Mouth: Mucous membranes are moist.  Eyes:     Pupils: Pupils are equal, round, and reactive to light.  Cardiovascular:     Rate and Rhythm: Normal rate.  Pulmonary:     Effort: Pulmonary effort is normal.  Abdominal:     General: Abdomen is flat.  Skin:    General: Skin is warm.  Neurological:     Mental Status: She is alert.     Comments: Patient is awake and alert strength is 5 out of 5 iliopsoas, quads, hamstrings, gastrocs, into tibialis, and EHL.      Assessment/Plan 77 year old presents for redo posterior lateral arthrodesis L5-S1  Mariam Dollar, MD 08/10/2022, 8:37 AM

## 2022-08-10 NOTE — Anesthesia Procedure Notes (Signed)
Procedure Name: Intubation Date/Time: 08/10/2022 9:25 AM  Performed by: Wilder Glade, CRNAPre-anesthesia Checklist: Patient identified, Emergency Drugs available, Patient being monitored, Suction available and Timeout performed Patient Re-evaluated:Patient Re-evaluated prior to induction Oxygen Delivery Method: Circle system utilized Preoxygenation: Pre-oxygenation with 100% oxygen Induction Type: IV induction Ventilation: Mask ventilation without difficulty Laryngoscope Size: Miller and 2 Grade View: Grade I Tube type: Oral Tube size: 7.0 mm Number of attempts: 1 (brief atraumatic dentition unchanged from preop) Airway Equipment and Method: Stylet Placement Confirmation: ETT inserted through vocal cords under direct vision, positive ETCO2 and breath sounds checked- equal and bilateral Secured at: 21 cm Tube secured with: Tape Dental Injury: Teeth and Oropharynx as per pre-operative assessment

## 2022-08-10 NOTE — Anesthesia Postprocedure Evaluation (Signed)
Anesthesia Post Note  Patient: Patricia Parks  Procedure(s) Performed: Reexploration of Fusion Lumbar Five- Sacral One with Redo Postero-Lateral Fusion Replacement of Hardware and Posterio Lateral Arthodesis (Bilateral: Back)     Patient location during evaluation: PACU Anesthesia Type: General Level of consciousness: patient cooperative, oriented and sedated Pain management: pain level controlled Vital Signs Assessment: post-procedure vital signs reviewed and stable Respiratory status: spontaneous breathing, nonlabored ventilation, respiratory function stable and patient connected to nasal cannula oxygen Cardiovascular status: blood pressure returned to baseline and stable Postop Assessment: no apparent nausea or vomiting Anesthetic complications: no   No notable events documented.  Last Vitals:  Vitals:   08/10/22 1230 08/10/22 1251  BP: 136/61 (!) 143/80  Pulse: (!) 54 63  Resp: 14 18  Temp: 36.6 C 36.6 C  SpO2: 100% 98%    Last Pain:  Vitals:   08/10/22 1215  TempSrc:   PainSc: Asleep                 Israel Werts,E. Tyesha Joffe

## 2022-08-10 NOTE — Op Note (Signed)
Preoperative diagnosis: Pseudoarthrosis L5-S1.  Postoperative diagnosis: Same.  Procedure: #1 reexploration of fusion removal of hardware L5-S1 on the right L4-S1 on the left with removal of bilateral S1 screws and right L5 screw.  2.  Redo posterior lateral arthrodesis 5 S1 utilizing locally harvested autograft mixed with BMP and Vivigen and cortical fibers.  3.  Pedicle screw fixation L5-S1 on the right L4-S1 on the left with replacement of right L5 screw and bilateral S1 screws utilizing globus Creo amp modular close fusion at.  Surgeon: Donalee Citrin.  Assistant: Julien Girt.  Anesthesia: General.  EBL: Minimal.  HPI: 76-year female previously undergone an L2-S1 fusion over successive operations and progressive worsening back pain workup revealed pseudoarthrosis L5-S1 with loosening of both S1 screws and probable loosening of L5 screw.  Due to her progression of clinical syndrome failed conservative treatment imaging I recommended reexploration fusion and redo posterior lateral arthrodesis L5-S1.  I extensively reviewed the risks and benefits of the operation with her as well as perioperative course expectations of outcome and alternatives of surgery and she understood and agreed to proceed forward.  Operative procedure: Patient was brought into the OR was induced under general anesthesia positioned prone Wilson frame her back was prepped and draped in routine sterile fashion.  Her old incision was opened up and the scar tissue was dissected free exposing the hardware from L4-S1 the left and L5-S1 on the right.  I then disconnected the knots remove the rods the S1 screws were definitely loose as well as the right L5 screw was questionably loose the left L5 screw appeared to be solid.  So I remove the loose screws and then exposed the sacral ala and TPs from L4-S1 bilaterally.  After adequate exposure and and the TPs and lateral facet joints and ala was achieved copiously irrigated the wound  and aggressively decorticated these bony surfaces.  Then the BMP Vivigen and cortical fibers were all mixed together and packed posterior laterally from the TPs at L4-5 and S1 bilaterally.  Then after this fluoroscopy was brought into the field and under fluoroscopic guidance I replaced the screws now on the left I replaced with 2 sizes larger an 8.5 x 40 on the right I replaced these 1 size larger a 7.5 x 40 the 7.5 x 40 screws had excellent purchase the 8.5 had pretty good purchase I did advance this and capture bicortical sacral purchase with both sacral screws.  All screws appear to be solid I then assembled the heads advanced the screws and placed rods and locked everything in place.  I then placed a medium Hemovac drain injected Exparel in the fascia and closed the wound in layers with opted Vicryl and a running 4 subcuticular.  My assistant Julien Girt was there for all critical parts of the exposure decortication and screw placement.  At the end the case all needle count sponge counts were correct.

## 2022-08-11 ENCOUNTER — Other Ambulatory Visit: Payer: Self-pay

## 2022-08-11 NOTE — Evaluation (Signed)
Occupational Therapy Evaluation Patient Details Name: Patricia Parks MRN: [id number] DOB: [birth year] Today's Date: 08/11/2022   History of Present Illness Pt is a 77 y.o. female s/p re-exploration of fusion L5-S1 with redo postero-lateral fusion replacement of hardware and posterolateral arthrodesis 08/10/2022. PMH significant for back surgery 09/13/2021, anxiety, depression, arthritis, fibromyalgia, HTN, gout, lumbar fusion 2021.   Clinical Impression   PTA, pt lived with her husband and was independent. Upon eval, pt performing UB ADL with set-up and LB ADL with min guard A and AE. Pt educated and demonstrating use of compensatory techniques for bed mobility, LB ADL, grooming, toileting, and tub-shower/walk-in shower transfers within precautions. All education provided and questions answered. Recommending discharge home with no OT follow up at this time. Will follow to optimize safety and mobility while acute.       Recommendations for follow up therapy are one component of a multi-disciplinary discharge planning process, led by the attending physician.  Recommendations may be updated based on patient status, additional functional criteria and insurance authorization.   Assistance Recommended at Discharge Intermittent Supervision/Assistance  Patient can return home with the following A little help with walking and/or transfers;A little help with bathing/dressing/bathroom;Assistance with cooking/housework;Assist for transportation;Help with stairs or ramp for entrance    Functional Status Assessment  Patient has had a recent decline in their functional status and demonstrates the ability to make significant improvements in function in a reasonable and predictable amount of time.  Equipment Recommendations  None recommended by OT    Recommendations for Other Services       Precautions / Restrictions Precautions Precautions: Back Precaution Booklet Issued: Yes (comment) Precaution Comments:  reviewed precutions and proper posture Restrictions Weight Bearing Restrictions: No      Mobility Bed Mobility Overal bed mobility: Needs Assistance Bed Mobility: Rolling, Sidelying to Sit, Sit to Sidelying Rolling: Supervision Sidelying to sit: Supervision     Sit to sidelying: Min assist General bed mobility comments: pt able to roll to side and sit up with increased time, vc's for back precautions. Needed min A to RLE for return to SL. Pt reports this has been a struggle at home, discussed using a leg lifter to assist and increased independence.    Transfers Overall transfer level: Needs assistance Equipment used: Rolling walker (2 wheels) Transfers: Sit to/from Stand Sit to Stand: Min guard           General transfer comment: min guard for safety, RLE mildly buckling with initial stand      Balance Overall balance assessment: Needs assistance Sitting-balance support: No upper extremity supported, Feet supported Sitting balance-Leahy Scale: Good     Standing balance support: Single extremity supported, During functional activity Standing balance-Leahy Scale: Fair                             ADL either performed or assessed with clinical judgement   ADL Overall ADL's : Needs assistance/impaired Eating/Feeding: Modified independent   Grooming: Supervision/safety;Standing   Upper Body Bathing: Set up;Sitting   Lower Body Bathing: Min guard;Sit to/from stand   Upper Body Dressing : Set up;Sitting   Lower Body Dressing: Min guard;With adaptive equipment   Toilet Transfer: Min guard;Rolling walker (2 wheels)     Toileting - Clothing Manipulation Details (indicate cue type and reason): reviewed compensatory strategies Tub/ Shower Transfer: Walk-in shower;Tub transfer;Min guard;Ambulation;Rolling walker (2 wheels) Tub/Shower Transfer Details (indicate cue type and reason): Min guard A for each  Functional mobility during ADLs: Min guard;Rolling  walker (2 wheels)       Vision Ability to See in Adequate Light: 0 Adequate Patient Visual Report: No change from baseline Vision Assessment?: No apparent visual deficits     Perception     Praxis      Pertinent Vitals/Pain Pain Assessment Pain Assessment: 0-10 Pain Score: 5  Pain Location: back and R>L LE's Pain Descriptors / Indicators: Shooting, Guarding, Grimacing Pain Intervention(s): Monitored during session, Limited activity within patient's tolerance     Hand Dominance Right   Extremity/Trunk Assessment Upper Extremity Assessment Upper Extremity Assessment: Overall WFL for tasks assessed   Lower Extremity Assessment Lower Extremity Assessment: Defer to PT evaluation   Cervical / Trunk Assessment Cervical / Trunk Assessment: Back Surgery   Communication Communication Communication: No difficulties   Cognition Arousal/Alertness: Awake/alert Behavior During Therapy: WFL for tasks assessed/performed Overall Cognitive Status: Within Functional Limits for tasks assessed                                       General Comments       Exercises     Shoulder Instructions      Home Living Family/patient expects to be discharged to:: Private residence Living Arrangements: Spouse/significant other Available Help at Discharge: Family;Available PRN/intermittently Type of Home: House Home Access: Stairs to enter [employer] of Steps: 2 Entrance Stairs-Rails: Right Home Layout: One level     Bathroom Shower/Tub: Chief Strategy Officer: Standard Bathroom Accessibility: No   Home Equipment: Agricultural consultant (2 wheels);Grab bars - tub/shower;BSC/3in1   Additional Comments: Pt reports spouse uses cane for mobility and can help with some things such as driving and cooking      Prior Functioning/Environment Prior Level of Function : Driving;Independent/Modified Independent             Mobility Comments: has been using  SPC recently. Limited mobility distance          OT Problem List: Decreased strength;Impaired balance (sitting and/or standing);Decreased knowledge of use of DME or AE;Decreased knowledge of precautions      OT Treatment/Interventions: Self-care/ADL training;Therapeutic exercise;DME and/or AE instruction;Balance training;Patient/family education;Therapeutic activities    OT Goals(Current goals can be found in the care plan section) Acute Rehab OT Goals Patient Stated Goal: get better OT Goal Formulation: With patient Time For Goal Achievement: 08/25/22 Potential to Achieve Goals: Good  OT Frequency: Min 2X/week    Co-evaluation              AM-PAC OT "6 Clicks" Daily Activity     Outcome Measure Help from another person eating meals?: None Help from another person taking care of personal grooming?: A Little Help from another person toileting, which includes using toliet, bedpan, or urinal?: A Little Help from another person bathing (including washing, rinsing, drying)?: A Little Help from another person to put on and taking off regular upper body clothing?: A Little Help from another person to put on and taking off regular lower body clothing?: A Little 6 Click Score: 19   End of Session Equipment Utilized During Treatment: Gait belt;Rolling walker (2 wheels) Nurse Communication: Mobility status  Activity Tolerance: Patient tolerated treatment well Patient left: in bed;with call bell/phone within reach;with bed alarm set  OT Visit Diagnosis: Unsteadiness on feet (R26.81);Muscle weakness (generalized) (M62.81)  Time: 1458-1540 OT Time Calculation (min): 42 min Charges:  OT General Charges $OT Visit: 1 Visit OT Evaluation $OT Eval Low Complexity: 1 Low OT Treatments $Self Care/Home Management : 23-37 mins  Patricia Parks, OTR/L [facility] Acute Rehabilitation Office: [phone]   Patricia Parks 08/11/2022, 6:07 PM

## 2022-08-11 NOTE — Evaluation (Signed)
Physical Therapy Evaluation Patient Details Name: Patricia Parks MRN: [id number] DOB: [birth year] Today's Date: 08/11/2022  History of Present Illness  Pt is a 77 y.o. female s/p re-exploration of fusion L5-S1 with redo postero-lateral fusion replacement of hardware and posterolateral arthrodesis 08/10/2022. PMH significant for back surgery 09/13/2021, anxiety, depression, arthritis, fibromyalgia, HTN, gout, lumbar fusion 2021.  Clinical Impression  Pt admitted with above diagnosis. Pt from home with husband where she has been independent but very limited in her activity tolerance by pain. Pt with continued pain in back and BLE's R>L. Also with mild RLE weakness with initial standing. Pt required min A and use of RW for safe mobility. She ambulated 50'. Recommend HHPT to help strengthen LE's and to help her transition to community based exercise.  Pt currently with functional limitations due to the deficits listed below (see PT Problem List). Pt will benefit from acute skilled PT to increase their independence and safety with mobility to allow discharge.          Recommendations for follow up therapy are one component of a multi-disciplinary discharge planning process, led by the attending physician.  Recommendations may be updated based on patient status, additional functional criteria and insurance authorization.  Follow Up Recommendations       Assistance Recommended at Discharge PRN  Patient can return home with the following  Assistance with cooking/housework;A little help with bathing/dressing/bathroom;Assist for transportation;Help with stairs or ramp for entrance    Equipment Recommendations None recommended by PT  Recommendations for Other Services       Functional Status Assessment Patient has had a recent decline in their functional status and demonstrates the ability to make significant improvements in function in a reasonable and predictable amount of time.     Precautions /  Restrictions Precautions Precautions: Back Precaution Booklet Issued: Yes (comment) Precaution Comments: reviewed precutions and proper posture Restrictions Weight Bearing Restrictions: No      Mobility  Bed Mobility Overal bed mobility: Needs Assistance Bed Mobility: Rolling, Sidelying to Sit, Sit to Sidelying Rolling: Supervision Sidelying to sit: Supervision     Sit to sidelying: Min assist General bed mobility comments: pt able to roll to side and sit up with increased time, vc's for back precautions. Needed min A to RLE for return to SL. Pt reports this has been a struggle at home, discussed using a leg lifter to assist and increased independence.    Transfers Overall transfer level: Needs assistance Equipment used: Rolling walker (2 wheels) Transfers: Sit to/from Stand Sit to Stand: Min guard           General transfer comment: min guard for safety, RLE mildly buckling with initial stand    Ambulation/Gait Ambulation/Gait assistance: Min guard Gait Distance (Feet): 50 Feet Assistive device: Rolling walker (2 wheels) Gait Pattern/deviations: Step-through pattern, Decreased stride length, Decreased weight shift to right Gait velocity: decreased Gait velocity interpretation: <1.8 ft/sec, indicate of risk for recurrent falls   General Gait Details: mild instability R hip/ knee, compensated with wt through UE's. No LOB with use of RW  Stairs            Wheelchair Mobility    Modified Rankin (Stroke Patients Only)       Balance Overall balance assessment: Needs assistance Sitting-balance support: No upper extremity supported, Feet supported Sitting balance-Leahy Scale: Good     Standing balance support: Single extremity supported, During functional activity Standing balance-Leahy Scale: Fair  Pertinent Vitals/Pain Pain Assessment Pain Assessment: 0-10 Pain Score: 5  Pain Location: back and R>L LE's Pain  Descriptors / Indicators: Shooting, Guarding, Grimacing Pain Intervention(s): Limited activity within patient's tolerance, Monitored during session, Premedicated before session, Repositioned    Home Living Family/patient expects to be discharged to:: Private residence Living Arrangements: Spouse/significant other Available Help at Discharge: Family;Available PRN/intermittently Type of Home: House Home Access: Stairs to enter Entrance Stairs-Rails: Right Entrance Stairs-Number of Steps: 2   Home Layout: One level Home Equipment: Agricultural consultant (2 wheels);Grab bars - tub/shower;BSC/3in1 Additional Comments: Pt reports spouse uses cane for mobility and can help with some things such as driving and cooking    Prior Function Prior Level of Function : Driving;Independent/Modified Independent             Mobility Comments: has been using SPC recently. Limited mobility distance       Hand Dominance   Dominant Hand: Right    Extremity/Trunk Assessment   Upper Extremity Assessment Upper Extremity Assessment: Defer to OT evaluation    Lower Extremity Assessment Lower Extremity Assessment: RLE deficits/detail;LLE deficits/detail RLE Deficits / Details: hip flex 3-/5, knee ext 3+/5, noted R hip weakness with initial steps of ambulation. Sensory changes to foot RLE Sensation: decreased light touch;decreased proprioception RLE Coordination: decreased gross motor LLE Deficits / Details: hip flex 3+/5, knee ext 4/5. Has some sensory changes and pain down to L foot as well but not as much as R LLE Sensation: decreased light touch LLE Coordination: WNL    Cervical / Trunk Assessment Cervical / Trunk Assessment: Back Surgery  Communication   Communication: No difficulties  Cognition Arousal/Alertness: Awake/alert Behavior During Therapy: WFL for tasks assessed/performed Overall Cognitive Status: Within Functional Limits for tasks assessed                                           General Comments General comments (skin integrity, edema, etc.): VSS. Discussed seating choices at home and activity level    Exercises Other Exercises Other Exercises: posterior pelvic tilt in supine x5   Assessment/Plan    PT Assessment Patient needs continued PT services  PT Problem List Decreased strength;Decreased activity tolerance;Decreased balance;Decreased mobility;Decreased knowledge of precautions;Pain       PT Treatment Interventions DME instruction;Gait training;Stair training;Functional mobility training;Therapeutic activities;Therapeutic exercise;Balance training;Patient/family education    PT Goals (Current goals can be found in the Care Plan section)  Acute Rehab PT Goals Patient Stated Goal: return home, decreased pain PT Goal Formulation: With patient Time For Goal Achievement: 08/25/22 Potential to Achieve Goals: Good    Frequency Min 5X/week     Co-evaluation               AM-PAC PT "6 Clicks" Mobility  Outcome Measure Help needed turning from your back to your side while in a flat bed without using bedrails?: None Help needed moving from lying on your back to sitting on the side of a flat bed without using bedrails?: A Little Help needed moving to and from a bed to a chair (including a wheelchair)?: A Little Help needed standing up from a chair using your arms (e.g., wheelchair or bedside chair)?: A Little Help needed to walk in hospital room?: A Little Help needed climbing 3-5 steps with a railing? : A Little 6 Click Score: 19    End of Session Equipment Utilized During Treatment:  Gait belt Activity Tolerance: Patient tolerated treatment well Patient left: in bed;with call bell/phone within reach;with bed alarm set Nurse Communication: Mobility status PT Visit Diagnosis: Unsteadiness on feet (R26.81);Muscle weakness (generalized) (M62.81);Difficulty in walking, not elsewhere classified (R26.2);Pain Pain - Right/Left:  Right Pain - part of body: Leg    Time: 1253-1330 PT Time Calculation (min) (ACUTE ONLY): 37 min   Charges:   PT Evaluation $PT Eval Moderate Complexity: 1 Mod PT Treatments $Gait Training: 8-22 mins        Lyanne Co, PT  Acute Rehab Services Secure chat preferred Office [phone]   Patricia Parks 08/11/2022, 1:58 PM

## 2022-08-11 NOTE — Plan of Care (Signed)
  Problem: Education: Goal: Knowledge of General Education information will improve Description: Including pain rating scale, medication(s)/side effects and non-pharmacologic comfort measures Outcome: Progressing   Problem: Health Behavior/Discharge Planning: Goal: Ability to manage health-related needs will improve Outcome: Progressing   Problem: Activity: Goal: Risk for activity intolerance will decrease Outcome: Progressing   Problem: Pain Managment: Goal: General experience of comfort will improve Outcome: Progressing   Problem: Education: Goal: Ability to verbalize activity precautions or restrictions will improve Outcome: Progressing Goal: Knowledge of the prescribed therapeutic regimen will improve Outcome: Progressing Goal: Understanding of discharge needs will improve Outcome: Progressing

## 2022-08-11 NOTE — Progress Notes (Signed)
Subjective: The patient is alert and pleasant.  She does not feel she is ready to go home yet.  Objective: Vital signs in last 24 hours: Temp:  [97.6 F (36.4 C)-98.2 F (36.8 C)] 98.2 F (36.8 C) (06/08 0754) Pulse Rate:  [54-82] 63 (06/08 0754) Resp:  [14-20] 19 (06/08 0754) BP: (100-148)/(60-87) 138/69 (06/08 0754) SpO2:  [93 %-100 %] 99 % (06/08 0754) Estimated body mass index is 32.72 kg/m as calculated from the following:   Height as of this encounter: 5\' 5"  (1.651 m).   Weight as of this encounter: 89.2 kg.   Intake/Output from previous day: 06/07 0701 - 06/08 0700 In: 1730 [P.O.:480; I.V.:1200; IV Piggyback:50] Out: 505 [Urine:150; Drains:155; Blood:200] Intake/Output this shift: No intake/output data recorded.  Physical exam the patient is alert and pleasant.  Her lower extremity strength is grossly normal.  Lab Results: No results for input(s): "WBC", "HGB", "HCT", "PLT" in the last 72 hours. BMET No results for input(s): "NA", "K", "CL", "CO2", "GLUCOSE", "BUN", "CREATININE", "CALCIUM" in the last 72 hours.  Studies/Results: DG Lumbar Spine 2-3 Views  Result Date: 08/10/2022 CLINICAL DATA:  Elective surgery. Re-exploration of L5-S1 fusion, redo posterior-lateral fusion replacement of hardware and posterior-lateral arthrodesis. EXAM: LUMBAR SPINE - 2-3 VIEW COMPARISON:  CT 05/18/2022 FINDINGS: Two fluoroscopic spot views of the lumbar spine obtained in the operating room. Multilevel pedicle screw, posterior rod and interbody spacers are demonstrated. Fluoroscopy time 17 seconds. Dose 15.24 mGy. IMPRESSION: Intraoperative fluoroscopy during lumbar surgery. Electronically Signed   By: Narda Rutherford M.D.   On: 08/10/2022 12:11   DG C-Arm 1-60 Min-No Report  Result Date: 08/10/2022 Fluoroscopy was utilized by the requesting physician.  No radiographic interpretation.    Assessment/Plan: Postop day #1: We will mobilize the patient with physical therapy.  She will  likely go home tomorrow.  LOS: 1 day     Cristi Loron 08/11/2022, 9:04 AM     [name], female   DOB: 22-Sep-1945, 77 y.o.   MRN: [id number]

## 2022-08-11 NOTE — Progress Notes (Signed)
80ml total output HV drain this shift

## 2022-08-12 MED ORDER — HYDROCODONE-ACETAMINOPHEN 10-325 MG PO TABS: 1.0000 | ORAL_TABLET | ORAL | 0 refills | Status: DC | PRN

## 2022-08-12 NOTE — Plan of Care (Signed)
  Problem: Health Behavior/Discharge Planning: Goal: Ability to manage health-related needs will improve Outcome: Progressing   Problem: Activity: Goal: Risk for activity intolerance will decrease Outcome: Progressing   Problem: Pain Managment: Goal: General experience of comfort will improve Outcome: Progressing   Problem: Education: Goal: Ability to verbalize activity precautions or restrictions will improve Outcome: Progressing Goal: Understanding of discharge needs will improve Outcome: Progressing   Problem: Activity: Goal: Ability to avoid complications of mobility impairment will improve Outcome: Progressing Goal: Ability to tolerate increased activity will improve Outcome: Progressing Goal: Will remain free from falls Outcome: Progressing   Problem: Pain Management: Goal: Pain level will decrease Outcome: Progressing

## 2022-08-12 NOTE — Progress Notes (Signed)
Occupational Therapy Treatment Patient Details Name: Patricia Parks MRN: [id number] DOB: 08-Dec-1945 Today's Date: 08/12/2022   History of present illness Pt is a 77 y.o. female s/p re-exploration of fusion L5-S1 with redo postero-lateral fusion replacement of hardware and posterolateral arthrodesis 08/10/2022. PMH significant for back surgery 09/13/2021, anxiety, depression, arthritis, fibromyalgia, HTN, gout, lumbar fusion 2021.   OT comments  Patient demonstrated good gains with OT treatment with supervision for bed mobility, transfers and LB dressing with AE. Patient instructed on reacher use for donning pants, simulated with pillow case, and patient able to perform with demonstration and verbal cues. Discharge plans continue to be appropriate. Acute OT to continue to follow.    Recommendations for follow up therapy are one component of a multi-disciplinary discharge planning process, led by the attending physician.  Recommendations may be updated based on patient status, additional functional criteria and insurance authorization.    Assistance Recommended at Discharge Intermittent Supervision/Assistance  Patient can return home with the following  A little help with walking and/or transfers;A little help with bathing/dressing/bathroom;Assistance with cooking/housework;Assist for transportation;Help with stairs or ramp for entrance   Equipment Recommendations  None recommended by OT    Recommendations for Other Services      Precautions / Restrictions Precautions Precautions: Back Precaution Booklet Issued: Yes (comment) Precaution Comments: reviewed precutions and proper posture Restrictions Weight Bearing Restrictions: No       Mobility Bed Mobility Overal bed mobility: Needs Assistance Bed Mobility: Rolling, Sidelying to Sit Rolling: Supervision Sidelying to sit: Supervision       General bed mobility comments: demonstrated good understanding of log rolling technique     Transfers Overall transfer level: Needs assistance Equipment used: Rolling walker (2 wheels) Transfers: Sit to/from Stand Sit to Stand: Supervision           General transfer comment: supervision with walker use and cues for safety     Balance Overall balance assessment: Needs assistance Sitting-balance support: No upper extremity supported, Feet supported Sitting balance-Leahy Scale: Good     Standing balance support: Single extremity supported, During functional activity Standing balance-Leahy Scale: Fair Standing balance comment: stood at sink for grooming tasks                           ADL either performed or assessed with clinical judgement   ADL Overall ADL's : Needs assistance/impaired     Grooming: Wash/dry hands;Wash/dry face;Oral care;Supervision/safety;Standing Grooming Details (indicate cue type and reason): at sink             Lower Body Dressing: Supervision/safety;With adaptive equipment;Sitting/lateral leans Lower Body Dressing Details (indicate cue type and reason): adaptive equipment training with reacher and sock aide with patient able to recall instructions from yesterday Toilet Transfer: Supervision/safety;Regular Toilet;Rolling walker (2 wheels)   Toileting- Clothing Manipulation and Hygiene: Modified independent;Sitting/lateral lean   Tub/ Shower Transfer: Walk-in shower;Supervision/safety;Cueing for safety;Grab bars Web designer Details (indicate cue type and reason): cues for safety and grab bars use   General ADL Comments: demonstrated good understanding of AE use    Extremity/Trunk Assessment              Vision       Perception     Praxis      Cognition Arousal/Alertness: Awake/alert Behavior During Therapy: WFL for tasks assessed/performed Overall Cognitive Status: Within Functional Limits for tasks assessed  General Comments: able to recall back  precautions        Exercises      Shoulder Instructions       General Comments      Pertinent Vitals/ Pain       Pain Assessment Pain Assessment: Faces Faces Pain Scale: Hurts little more Pain Location: back and R>L LE's Pain Descriptors / Indicators: Shooting, Guarding, Grimacing Pain Intervention(s): Limited activity within patient's tolerance, Monitored during session, Patient requesting pain meds-RN notified  Home Living                                          Prior Functioning/Environment              Frequency  Min 2X/week        Progress Toward Goals  OT Goals(current goals can now be found in the care plan section)  Progress towards OT goals: Progressing toward goals  Acute Rehab OT Goals Patient Stated Goal: go home OT Goal Formulation: With patient Time For Goal Achievement: 08/25/22 Potential to Achieve Goals: Good ADL Goals Pt Will Perform Lower Body Dressing: with modified independence;sit to/from stand Pt Will Perform Tub/Shower Transfer: Tub transfer;Shower transfer;with modified independence;rolling walker  Plan Discharge plan remains appropriate    Co-evaluation                 AM-PAC OT "6 Clicks" Daily Activity     Outcome Measure   Help from another person eating meals?: None Help from another person taking care of personal grooming?: A Little Help from another person toileting, which includes using toliet, bedpan, or urinal?: A Little Help from another person bathing (including washing, rinsing, drying)?: A Little Help from another person to put on and taking off regular upper body clothing?: A Little Help from another person to put on and taking off regular lower body clothing?: A Little 6 Click Score: 19    End of Session Equipment Utilized During Treatment: Gait belt;Rolling walker (2 wheels)  OT Visit Diagnosis: Unsteadiness on feet (R26.81);Muscle weakness (generalized) (M62.81)   Activity  Tolerance Patient tolerated treatment well   Patient Left in chair;with call bell/phone within reach   Nurse Communication Mobility status;Patient requests pain meds        Time: 0722-0750 OT Time Calculation (min): 28 min  Charges: OT General Charges $OT Visit: 1 Visit OT Treatments $Self Care/Home Management : 23-37 mins  Alfonse Flavors, OTA Acute Rehabilitation Services  Office [phone]   Dewain Penning 08/12/2022, 9:48 AM

## 2022-08-12 NOTE — Discharge Summary (Signed)
  Physician Discharge Summary  [name] MRN: [id number] DOB/AGE: 04/22/1945 77 y.o.  Admit date: 08/10/2022 Discharge date: 08/12/2022  Admission Diagnoses:  L5-S1 pseudoarthrosis  Discharge Diagnoses:  Same Principal Problem:   Pseudoarthrosis of lumbar spine   Discharged Condition: Stable  Hospital Course:  Patricia Parks is a 77 y.o. female underwent elective reexploration of fusion at L5-S1 with revision of hardware L4-S1.  She tolerated surgery well.  Postoperatively she was evaluated by PT/OT and agreed with home placement.  Incisions clean dry and intact.  Her pain was controlled on oral medication, she is having normal bowel bladder function.  Treatments: Surgery -L4-S1 revision of hardware, redo posterior lateral arthrodesis L5-S1  Discharge Exam: Blood pressure 106/60, pulse 62, temperature 98.1 F (36.7 C), temperature source Oral, resp. rate 18, height 5\' 5"  (1.651 m), weight 89.2 kg, last menstrual period 03/05/2000, SpO2 100 %. Awake, alert, oriented x 3 PERRLA Speech fluent, appropriate CN grossly intact 5/5 BUE/BLE Wound c/d/i  Disposition: Discharge disposition: 01-Home or Self Care        Allergies as of 08/12/2022   No Known Allergies      Medication List     STOP taking these medications    aspirin EC 81 MG tablet       TAKE these medications    allopurinol 100 MG tablet Commonly known as: ZYLOPRIM Take 100 mg by mouth in the morning.   chlorthalidone 25 MG tablet Commonly known as: HYGROTON TAKE 1/2 TABLET(12.5 MG) BY MOUTH DAILY   EYLEA IO Place 1 Syringe into the left eye See admin instructions. Every 12 weeks   HYDROcodone-acetaminophen 10-325 MG tablet Commonly known as: NORCO Take 1 tablet by mouth every 4 (four) hours as needed for moderate pain (Postop pain control). What changed:  when to take this reasons to take this   losartan 100 MG tablet Commonly known as: COZAAR Take 1 tablet (100 mg total)  by mouth daily. What changed: when to take this   methocarbamol 500 MG tablet Commonly known as: ROBAXIN Take 500 mg by mouth 4 (four) times daily.   metoprolol succinate 50 MG 24 hr tablet Commonly known as: TOPROL-XL Take 50 mg by mouth in the morning. Take with or immediately following a meal.   OVER THE COUNTER MEDICATION Apply 1 application  topically 2 (two) times daily as needed (pain). CBD Oil for back and feet pain   rosuvastatin 10 MG tablet Commonly known as: CRESTOR TAKE 1 TABLET(10 MG) BY MOUTH DAILY   tobramycin 0.3 % ophthalmic solution Commonly known as: TOBREX Place 1 drop into the left eye as directed. Instill 1 drop into left eye 4 times daily--the day before, the day of, and the day after eylea eye injection.   Voltaren 1 % Gel Generic drug: diclofenac Sodium Apply 1 Application topically 4 (four) times daily as needed (pain.).        Follow-up Information     Donalee Citrin, MD Follow up in 2 week(s).   Specialty: Neurosurgery Contact information: [address] [place] [address] [phone]                 Signed: Alan Mulder Jalin Erpelding 08/12/2022, 10:37 AM

## 2022-08-12 NOTE — Progress Notes (Signed)
Physical Therapy Treatment Patient Details Name: Patricia Parks MRN: [id number] DOB: 08-Mar-1945 Today's Date: 08/12/2022   History of Present Illness Pt is a 77 y.o. female s/p re-exploration of fusion L5-S1 with redo postero-lateral fusion replacement of hardware and posterolateral arthrodesis 08/10/2022. PMH significant for back surgery 09/13/2021, anxiety, depression, arthritis, fibromyalgia, HTN, gout, lumbar fusion 2021.    PT Comments    Pt is making good progress with functional mobility, ambulating an increased distance of up to ~110 ft x2 bouts and navigating x2 stairs today. Educated pt on feet sequencing on stairs and on car transfers, with pt demonstrating good understanding and spinal precautions compliance with both. Will continue to follow acutely.     Recommendations for follow up therapy are one component of a multi-disciplinary discharge planning process, led by the attending physician.  Recommendations may be updated based on patient status, additional functional criteria and insurance authorization.  Follow Up Recommendations       Assistance Recommended at Discharge PRN  Patient can return home with the following Assistance with cooking/housework;A little help with bathing/dressing/bathroom;Assist for transportation;Help with stairs or ramp for entrance   Equipment Recommendations  None recommended by PT    Recommendations for Other Services       Precautions / Restrictions Precautions Precautions: Back Precaution Booklet Issued: Yes (comment) Precaution Comments: reviewed precautions and proper posture, good recall by pt; hemovac Restrictions Weight Bearing Restrictions: No     Mobility  Bed Mobility Overal bed mobility: Needs Assistance Bed Mobility: Rolling, Sidelying to Sit, Sit to Sidelying Rolling: Supervision Sidelying to sit: Supervision     Sit to sidelying: Supervision General bed mobility comments: Bed flat with pt able to demo good carryover  of cues to maintain spinal precautions with all bed mobility, extra time due to pain, supervision for safety    Transfers Overall transfer level: Needs assistance Equipment used: Rolling walker (2 wheels) Transfers: Sit to/from Stand Sit to Stand: Supervision           General transfer comment: supervision for safety, no LOB, good power up to stand from EOB and from chair    Ambulation/Gait Ambulation/Gait assistance: Supervision Gait Distance (Feet): 110 Feet (x2 bouts of ~110 ft each) Assistive device: Rolling walker (2 wheels) Gait Pattern/deviations: Step-through pattern, Decreased stride length, Decreased weight shift to right, Decreased step length - left Gait velocity: decreased Gait velocity interpretation: <1.8 ft/sec, indicate of risk for recurrent falls   General Gait Details: Pt ambulating fairly steadily with mildly reduced R stance time and thus L step length, no LOB, supervision for safety   Stairs Stairs: Yes Stairs assistance: Min guard Stair Management: One rail Right, One rail Left, Step to pattern, Forwards Number of Stairs: 2 General stair comments: Ascends with R rail and descends with L to simulate home with pt leading up with L leg and down with R for safety, no LOB, min guard for safety   Wheelchair Mobility    Modified Rankin (Stroke Patients Only)       Balance Overall balance assessment: Needs assistance Sitting-balance support: No upper extremity supported, Feet supported Sitting balance-Leahy Scale: Good     Standing balance support: During functional activity, Bilateral upper extremity supported, Reliant on assistive device for balance Standing balance-Leahy Scale: Poor Standing balance comment: Reliant on RW to ambulate                            Cognition Arousal/Alertness: Awake/alert Behavior  During Therapy: WFL for tasks assessed/performed Overall Cognitive Status: Within Functional Limits for tasks assessed                                  General Comments: good recall of spinal precautions along with compliance throughout        Exercises      General Comments General comments (skin integrity, edema, etc.): reveiwed car transfers with pt practicing sit and pivot to enter/exit car using a chair 1x, supervision with cues; educated pt to mobilize and change positions frequently; she verbalized understanding of all education      Pertinent Vitals/Pain Pain Assessment Pain Assessment: Faces Faces Pain Scale: Hurts little more Pain Location: back and R>L LE's Pain Descriptors / Indicators: Shooting, Guarding, Grimacing Pain Intervention(s): Limited activity within patient's tolerance, Monitored during session, Repositioned    Home Living                          Prior Function            PT Goals (current goals can now be found in the care plan section) Acute Rehab PT Goals Patient Stated Goal: return home, decreased pain PT Goal Formulation: With patient Time For Goal Achievement: 08/25/22 Potential to Achieve Goals: Good Progress towards PT goals: Progressing toward goals    Frequency    Min 5X/week      PT Plan Current plan remains appropriate    Co-evaluation              AM-PAC PT "6 Clicks" Mobility   Outcome Measure  Help needed turning from your back to your side while in a flat bed without using bedrails?: None Help needed moving from lying on your back to sitting on the side of a flat bed without using bedrails?: A Little Help needed moving to and from a bed to a chair (including a wheelchair)?: A Little Help needed standing up from a chair using your arms (e.g., wheelchair or bedside chair)?: A Little Help needed to walk in hospital room?: A Little Help needed climbing 3-5 steps with a railing? : A Little 6 Click Score: 19    End of Session Equipment Utilized During Treatment: Gait belt Activity Tolerance: Patient tolerated  treatment well Patient left: in bed;with call bell/phone within reach;with bed alarm set   PT Visit Diagnosis: Unsteadiness on feet (R26.81);Muscle weakness (generalized) (M62.81);Difficulty in walking, not elsewhere classified (R26.2);Pain;Other abnormalities of gait and mobility (R26.89) Pain - Right/Left: Right Pain - part of body: Leg     Time: 1047-1101 PT Time Calculation (min) (ACUTE ONLY): 14 min  Charges:  $Gait Training: 8-22 mins                     Raymond Gurney, PT, DPT Acute Rehabilitation Services  Office: [phone]    Jewel Baize 08/12/2022, 12:33 PM

## 2022-08-12 NOTE — Progress Notes (Signed)
Pt has been discharged home. IV has been removed. Catheter intact. Hemovac removed. Pt tolerated well. Pt has received discharge instructions and states understanding. Pt has left floor with nursing staff with all belongings intact.

## 2022-08-12 NOTE — Plan of Care (Signed)

## 2022-08-13 ENCOUNTER — Encounter (HOSPITAL_COMMUNITY): Payer: Self-pay | Admitting: Neurosurgery

## 2022-08-21 MED FILL — Sodium Chloride IV Soln 0.9%: INTRAVENOUS | Qty: 1000 | Status: AC

## 2022-08-21 MED FILL — Heparin Sodium (Porcine) Inj 1000 Unit/ML: INTRAMUSCULAR | Qty: 30 | Status: AC

## 2022-08-24 ENCOUNTER — Ambulatory Visit
Admission: RE | Admit: 2022-08-24 | Discharge: 2022-08-24 | Disposition: A | Discharge status: Home / Self Care | Payer: Medicare HMO | Source: Ambulatory Visit | Attending: Obstetrics & Gynecology | Admitting: Obstetrics & Gynecology

## 2022-08-24 DIAGNOSIS — Z1231 Encounter for screening mammogram for malignant neoplasm of breast: Secondary | ICD-10-CM

## 2022-08-27 ENCOUNTER — Ambulatory Visit (HOSPITAL_BASED_OUTPATIENT_CLINIC_OR_DEPARTMENT_OTHER): Payer: Medicare HMO | Admitting: Obstetrics & Gynecology

## 2022-08-27 ENCOUNTER — Encounter (HOSPITAL_BASED_OUTPATIENT_CLINIC_OR_DEPARTMENT_OTHER): Payer: Self-pay | Admitting: Obstetrics & Gynecology

## 2022-08-27 VITALS — BP 137/61 | HR 60 | Ht 65.0 in | Wt 195.8 lb

## 2022-08-27 DIAGNOSIS — Z780 Asymptomatic menopausal state: Secondary | ICD-10-CM | POA: Diagnosis not present

## 2022-08-27 DIAGNOSIS — Z87410 Personal history of cervical dysplasia: Secondary | ICD-10-CM

## 2022-08-27 DIAGNOSIS — M8580 Other specified disorders of bone density and structure, unspecified site: Secondary | ICD-10-CM

## 2022-08-27 DIAGNOSIS — Z6832 Body mass index (BMI) 32.0-32.9, adult: Secondary | ICD-10-CM | POA: Diagnosis not present

## 2022-08-27 NOTE — Progress Notes (Unsigned)
77 y.o. G21P2000 Married White or Caucasian female here for reestablishing care.  Last appt was 09/12/2018.  Multiple notes and labwork as well as imaging results reviewed.  Has had a lot of back issues since I saw her last.  In fact, just underwent exploration of lumber fusion that was done about a year ago on 08/10/2022.  Reports  issues with screws so some hardware was removed.  Is having trouble with sitting on standing for lengths of time.  Is hopeful she will be able to start PT soon.  Has upcoming appt for follow up.  Denies vaginal bleeding.  No urinary complaints.    Would like suggestions for PCP close to her home.  Patient's last menstrual period was 03/05/2000.          Sexually active:  Not currently H/O STD:  none  Health Maintenance: PCP:  Darryll Capers.   Vaccines are up to date:  pt reports up to date Colonoscopy:  12/28/2015 MMG:  08/24/2022 BMD:  02/15/2022 Last pap smear:  04/24/2017.   H/o abnormal pap smear:  2016    reports that she quit smoking about 49 years ago. Her smoking use included cigarettes. She has a 1.25 pack-year smoking history. She has never used smokeless tobacco. She reports current alcohol use. She reports that she does not use drugs.  Past Medical History:  Diagnosis Date   Anxiety    Arthritis    Depression    Fatty liver    Fibromyalgia    Gout 04/2017   Hyperlipidemia    Hypertension    Hypothyroidism    pt no longer takes meds- just monitoring thyroid levels   Low kidney function    followed by Dr Nicholos Johns   Neuropathy    in both feet   Spinal stenosis     Past Surgical History:  Procedure Laterality Date   BACK SURGERY  09/13/2021   Carpel Tunnel Surgery Right 10/17/2018   both wrists   CATARACT EXTRACTION W/ INTRAOCULAR LENS IMPLANT Bilateral 10/25/2015   Right eye was first.  Left eye done 10/17.   CERVICAL BIOPSY  W/ LOOP ELECTRODE EXCISION  12/2008   hx CIN II   COLONOSCOPY     LAMINECTOMY WITH POSTERIOR LATERAL ARTHRODESIS  LEVEL 1 Bilateral 08/10/2022   Procedure: Reexploration of Fusion Lumbar Five- Sacral One with Redo Postero-Lateral Fusion Replacement of Hardware and Posterio Lateral Arthodesis;  Surgeon: Donalee Citrin, MD;  Location: [facility] OR;  Service: Neurosurgery;  Laterality: Bilateral;   LUMBAR FUSION  2021   RADIOLOGY WITH ANESTHESIA N/A 07/26/2020   Procedure: MRI WITH ANESTHESIA  LUMBAR WITH AND WITHOUT CONTRAST;  Surgeon: Radiologist, Medication, MD;  Location: [facility] OR;  Service: Radiology;  Laterality: N/A;    Current Outpatient Medications  Medication Sig Dispense Refill   Aflibercept (EYLEA IO) Place 1 Syringe into the left eye See admin instructions. Every 12 weeks     allopurinol (ZYLOPRIM) 100 MG tablet Take 100 mg by mouth in the morning.     chlorthalidone (HYGROTON) 25 MG tablet TAKE 1/2 TABLET(12.5 MG) BY MOUTH DAILY 45 tablet 3   diclofenac Sodium (VOLTAREN) 1 % GEL Apply 1 Application topically 4 (four) times daily as needed (pain.).     HYDROcodone-acetaminophen (NORCO) 10-325 MG tablet Take 1 tablet by mouth every 4 (four) hours as needed for moderate pain (Postop pain control). 30 tablet 0   losartan (COZAAR) 100 MG tablet Take 1 tablet (100 mg total) by mouth daily. (Patient taking differently:  Take 100 mg by mouth every evening.) 90 tablet 3   methocarbamol (ROBAXIN) 500 MG tablet Take 500 mg by mouth 4 (four) times daily.     metoprolol succinate (TOPROL-XL) 50 MG 24 hr tablet Take 50 mg by mouth in the morning. Take with or immediately following a meal.     OVER THE COUNTER MEDICATION Apply 1 application  topically 2 (two) times daily as needed (pain). CBD Oil for back and feet pain     rosuvastatin (CRESTOR) 10 MG tablet TAKE 1 TABLET(10 MG) BY MOUTH DAILY 90 tablet 3   tobramycin (TOBREX) 0.3 % ophthalmic solution Place 1 drop into the left eye as directed. Instill 1 drop into left eye 4 times daily--the day before, the day of, and the day after eylea eye injection.     No current  facility-administered medications for this visit.    Family History  Problem Relation Age of Onset   Osteoporosis Mother    Hypertension Mother    Uterine cancer Mother    Heart disease Mother        Atrial fibrillation   Breast cancer Sister 33   Osteoporosis Sister    Diabetes Sister    Diabetes Father    Heart disease Sister     Review of Systems  Constitutional: Negative.   Genitourinary: Negative.     Exam:   BP 137/61 (BP Location: Right Arm, Patient Position: Sitting, Cuff Size: Normal)   Pulse 60   Ht 5\' 5"  (1.651 m) Comment: Reported  Wt 195 lb 12.8 oz (88.8 kg)   LMP 03/05/2000   BMI 32.58 kg/m   Height: 5\' 5"  (165.1 cm) (Reported)  General appearance: alert, cooperative and appears stated age Breasts: normal appearance, no masses or tenderness Abdomen: soft, non-tender; bowel sounds normal; no masses,  no organomegaly Lymph nodes: Cervical, supraclavicular, and axillary nodes normal.  No abnormal inguinal nodes palpated Neurologic: Grossly normal  Pelvic: External genitalia:  no lesions              Urethra:  normal appearing urethra with no masses, tenderness or lesions              Bartholins and Skenes: normal                 Vagina: normal appearing vagina with atrophic changes and no discharge, no lesions              Cervix: no lesions              Pap taken: No. Bimanual Exam:  Uterus:  normal size, contour, position, consistency, mobility, non-tender              Adnexa: normal adnexa               Rectovaginal: Confirms               Anus:  normal sphincter tone, no lesions  Chaperone, Ina Homes, CMA, was present for exam.  Assessment/Plan: 1. Postmenopausal - Pap smear 2019 with neg HR HPV - Mammogram 08/2022 - Colonoscopy 12/27/2015 - Bone mineral density 02/15/2022 - vaccines reviewed/updated  2. 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   - follow up recommendations reviewed  3. Osteopenia, unspecified location -  last BMD 2023.  Calcium and Vit D supplements recommended with follow up BMD two years recommended.  4. BMI 32.0-32.9,adult  Total time with pt:  21 minutes Documentation and chart review: 12  minutes Total time:  33 minutes

## 2022-08-27 NOTE — Patient Instructions (Addendum)
Lutricia Horsfall, MD [address] [place], [place], [place] [address] Phone: [phone]

## 2022-08-30 ENCOUNTER — Encounter (HOSPITAL_BASED_OUTPATIENT_CLINIC_OR_DEPARTMENT_OTHER): Payer: Self-pay | Admitting: Obstetrics & Gynecology

## 2022-09-03 ENCOUNTER — Telehealth (HOSPITAL_BASED_OUTPATIENT_CLINIC_OR_DEPARTMENT_OTHER): Payer: Self-pay | Admitting: *Deleted

## 2022-09-03 NOTE — Telephone Encounter (Signed)
Attempted to call pt with recommendations. Mailbox full. Unable to leave message.

## 2022-09-04 ENCOUNTER — Encounter (HOSPITAL_BASED_OUTPATIENT_CLINIC_OR_DEPARTMENT_OTHER): Payer: Self-pay | Admitting: Obstetrics & Gynecology

## 2022-09-04 ENCOUNTER — Ambulatory Visit (INDEPENDENT_AMBULATORY_CARE_PROVIDER_SITE_OTHER): Payer: Medicare HMO | Admitting: Obstetrics & Gynecology

## 2022-09-04 ENCOUNTER — Other Ambulatory Visit (HOSPITAL_COMMUNITY)
Admission: RE | Admit: 2022-09-04 | Discharge: 2022-09-04 | Disposition: A | Discharge status: Home / Self Care | Payer: Medicare HMO | Source: Ambulatory Visit | Attending: Obstetrics & Gynecology | Admitting: Obstetrics & Gynecology

## 2022-09-04 VITALS — BP 150/85 | HR 66 | Ht 65.0 in | Wt 195.0 lb

## 2022-09-04 DIAGNOSIS — Z01419 Encounter for gynecological examination (general) (routine) without abnormal findings: Secondary | ICD-10-CM | POA: Insufficient documentation

## 2022-09-04 DIAGNOSIS — Z87410 Personal history of cervical dysplasia: Secondary | ICD-10-CM | POA: Insufficient documentation

## 2022-09-04 DIAGNOSIS — Z1151 Encounter for screening for human papillomavirus (HPV): Secondary | ICD-10-CM | POA: Diagnosis not present

## 2022-09-04 NOTE — Progress Notes (Signed)
GYNECOLOGY  VISIT  CC:   pap  HPI: 77 y.o. G51P2000 Married White or Caucasian female here for pap due to h/o LEEP.  Pt seen last week but I did not obtain pap smear.  She was called and could return today.    Has an area on her back she thinks is draining and wants me to look at it.  Most recent back surgery was 08/10/2022.  ALLERGIES: Patient has no known allergies.   PHYSICAL EXAMINATION:    BP (!) 150/85   Pulse 66   Ht 5\' 5"  (1.651 m)   Wt 195 lb (88.5 kg)   LMP 03/05/2000   BMI 32.45 kg/m     General appearance: alert, cooperative and appears stated age Incision on back:  well healed.  Small area of granulation tissue present.  Photo taken for pt and dressing applied. Pelvic: External genitalia:  no lesions              Urethra:  normal appearing urethra with no masses, tenderness or lesions              Bartholins and Skenes: normal                 Vagina: normal appearing vagina with normal color and discharge, no lesions              Cervix: no lesions               Chaperone, Raechel Ache, CMA, was present for exam.  Assessment/Plan: 1. History of cervical dysplasia - Cytology - PAP( [place] [facility])

## 2022-09-05 ENCOUNTER — Emergency Department (HOSPITAL_BASED_OUTPATIENT_CLINIC_OR_DEPARTMENT_OTHER)
Admission: EM | Admit: 2022-09-05 | Discharge: 2022-09-05 | Disposition: A | Discharge status: Home / Self Care | Payer: Medicare HMO | Attending: Emergency Medicine | Admitting: Emergency Medicine

## 2022-09-05 ENCOUNTER — Emergency Department (HOSPITAL_BASED_OUTPATIENT_CLINIC_OR_DEPARTMENT_OTHER): Payer: Medicare HMO | Admitting: Radiology

## 2022-09-05 ENCOUNTER — Encounter (HOSPITAL_BASED_OUTPATIENT_CLINIC_OR_DEPARTMENT_OTHER): Payer: Self-pay

## 2022-09-05 ENCOUNTER — Other Ambulatory Visit: Payer: Self-pay

## 2022-09-05 DIAGNOSIS — Z4800 Encounter for change or removal of nonsurgical wound dressing: Secondary | ICD-10-CM | POA: Diagnosis present

## 2022-09-05 DIAGNOSIS — M5441 Lumbago with sciatica, right side: Secondary | ICD-10-CM | POA: Diagnosis not present

## 2022-09-05 DIAGNOSIS — Z79899 Other long term (current) drug therapy: Secondary | ICD-10-CM | POA: Diagnosis not present

## 2022-09-05 DIAGNOSIS — I10 Essential (primary) hypertension: Secondary | ICD-10-CM | POA: Diagnosis not present

## 2022-09-05 DIAGNOSIS — Z5189 Encounter for other specified aftercare: Secondary | ICD-10-CM

## 2022-09-05 LAB — CBC WITH DIFFERENTIAL/PLATELET
Abs Immature Granulocytes: 0.02 10*3/uL (ref 0.00–0.07)
Basophils Absolute: 0 10*3/uL (ref 0.0–0.1)
Basophils Relative: 1 %
Eosinophils Absolute: 0.2 10*3/uL (ref 0.0–0.5)
Eosinophils Relative: 6 %
HCT: 31.6 % — ABNORMAL LOW (ref 36.0–46.0)
Hemoglobin: 11 g/dL — ABNORMAL LOW (ref 12.0–15.0)
Immature Granulocytes: 1 %
Lymphocytes Relative: 27 %
Lymphs Abs: 1 10*3/uL (ref 0.7–4.0)
MCH: 31.3 pg (ref 26.0–34.0)
MCHC: 34.8 g/dL (ref 30.0–36.0)
MCV: 90 fL (ref 80.0–100.0)
Monocytes Absolute: 0.3 10*3/uL (ref 0.1–1.0)
Monocytes Relative: 8 %
Neutro Abs: 2.2 10*3/uL (ref 1.7–7.7)
Neutrophils Relative %: 57 %
Platelets: 230 10*3/uL (ref 150–400)
RBC: 3.51 MIL/uL — ABNORMAL LOW (ref 3.87–5.11)
RDW: 12.6 % (ref 11.5–15.5)
WBC: 3.8 10*3/uL — ABNORMAL LOW (ref 4.0–10.5)
nRBC: 0 % (ref 0.0–0.2)

## 2022-09-05 LAB — COMPREHENSIVE METABOLIC PANEL
ALT: 8 U/L (ref 0–44)
AST: 13 U/L — ABNORMAL LOW (ref 15–41)
Albumin: 4.2 g/dL (ref 3.5–5.0)
Alkaline Phosphatase: 47 U/L (ref 38–126)
Anion gap: 10 (ref 5–15)
BUN: 19 mg/dL (ref 8–23)
CO2: 24 mmol/L (ref 22–32)
Calcium: 9.4 mg/dL (ref 8.9–10.3)
Chloride: 105 mmol/L (ref 98–111)
Creatinine, Ser: 0.8 mg/dL (ref 0.44–1.00)
GFR, Estimated: >60 mL/min (ref >60)
Glucose, Bld: 139 mg/dL — ABNORMAL HIGH (ref 70–99)
Potassium: 3.4 mmol/L — ABNORMAL LOW (ref 3.5–5.1)
Sodium: 139 mmol/L (ref 135–145)
Total Bilirubin: 1.1 mg/dL (ref 0.3–1.2)
Total Protein: 6 g/dL — ABNORMAL LOW (ref 6.5–8.1)

## 2022-09-05 LAB — LACTIC ACID, PLASMA: Lactic Acid, Venous: 1.2 mmol/L (ref 0.5–1.9)

## 2022-09-05 LAB — SEDIMENTATION RATE: Sed Rate: 11 mm/hr (ref 0–22)

## 2022-09-05 MED ORDER — OXYCODONE-ACETAMINOPHEN 5-325 MG PO TABS: 1.0000 | ORAL_TABLET | ORAL | 0 refills | Status: DC | PRN

## 2022-09-05 NOTE — ED Provider Notes (Signed)
[place] EMERGENCY DEPARTMENT AT [facility] Provider Note   CSN: [id number] Arrival date & time: 09/05/22  1455     History  Chief Complaint  Patient presents with   Wound Check    Patricia Parks is a 77 y.o. female.  The history is provided by the patient and medical records. No language interpreter was used.  Wound Check This is a new problem. The current episode started more than 1 week ago. The problem occurs constantly. The problem has been gradually worsening. Pertinent negatives include no chest pain, no abdominal pain, no headaches and no shortness of breath. Nothing aggravates the symptoms. Nothing relieves the symptoms. She has tried nothing for the symptoms. The treatment provided no relief.       Home Medications Prior to Admission medications   Medication Sig Start Date End Date Taking? Authorizing Provider  Aflibercept (EYLEA IO) Place 1 Syringe into the left eye See admin instructions. Every 12 weeks    [provider]  allopurinol (ZYLOPRIM) 100 MG tablet Take 100 mg by mouth in the morning.    [provider]  chlorthalidone (HYGROTON) 25 MG tablet TAKE 1/2 TABLET(12.5 MG) BY MOUTH DAILY 02/19/22   Custovic, Rozell Searing, DO  diclofenac Sodium (VOLTAREN) 1 % GEL Apply 1 Application topically 4 (four) times daily as needed (pain.).    [provider]  HYDROcodone-acetaminophen (NORCO) 10-325 MG tablet Take 1 tablet by mouth every 4 (four) hours as needed for moderate pain (Postop pain control). 08/12/22   Dawley, Troy C, DO  losartan (COZAAR) 100 MG tablet Take 1 tablet (100 mg total) by mouth daily. Patient taking differently: Take 100 mg by mouth every evening. 01/05/22   Custovic, Rozell Searing, DO  methocarbamol (ROBAXIN) 500 MG tablet Take 500 mg by mouth 4 (four) times daily. 07/11/22   [provider]  metoprolol succinate (TOPROL-XL) 50 MG 24 hr tablet Take 50 mg by mouth in the morning. Take with or immediately following a  meal.    [provider]  OVER THE COUNTER MEDICATION Apply 1 application  topically 2 (two) times daily as needed (pain). CBD Oil for back and feet pain    [provider]  rosuvastatin (CRESTOR) 10 MG tablet TAKE 1 TABLET(10 MG) BY MOUTH DAILY 03/09/22   Custovic, Rozell Searing, DO  tobramycin (TOBREX) 0.3 % ophthalmic solution Place 1 drop into the left eye as directed. Instill 1 drop into left eye 4 times daily--the day before, the day of, and the day after eylea eye injection.    [provider]      Allergies    Patient has no known allergies.    Review of Systems   Review of Systems  Constitutional:  Negative for chills, fatigue and fever.  HENT:  Negative for congestion.   Eyes:  Negative for visual disturbance.  Respiratory:  Negative for chest tightness and shortness of breath.   Cardiovascular:  Negative for chest pain.  Gastrointestinal:  Negative for abdominal pain, constipation, diarrhea, nausea and vomiting.  Genitourinary:  Negative for dysuria and flank pain.  Musculoskeletal:  Positive for back pain. Negative for neck pain and neck stiffness.  Skin:  Positive for wound. Negative for rash.  Neurological:  Negative for weakness, light-headedness, numbness and headaches.  Psychiatric/Behavioral:  Negative for agitation and confusion.     Physical Exam Updated Vital Signs BP (!) 152/66 (BP Location: Right Arm)   Pulse 73   Temp 97.6 F (36.4 C) (Oral)   Resp  18   Ht 5\' 5"  (1.651 m)   Wt 88.5 kg   LMP 03/05/2000   SpO2 100%   BMI 32.47 kg/m  Physical Exam Vitals and nursing note reviewed.  Constitutional:      General: She is not in acute distress.    Appearance: She is well-developed. She is not ill-appearing, toxic-appearing or diaphoretic.  HENT:     Head: Normocephalic and atraumatic.     Mouth/Throat:     Mouth: Mucous membranes are moist.  Eyes:     Extraocular Movements: Extraocular movements intact.     Conjunctiva/sclera:  Conjunctivae normal.     Pupils: Pupils are equal, round, and reactive to light.  Cardiovascular:     Rate and Rhythm: Normal rate and regular rhythm.     Heart sounds: No murmur heard. Pulmonary:     Effort: Pulmonary effort is normal. No respiratory distress.     Breath sounds: Normal breath sounds. No wheezing, rhonchi or rales.  Chest:     Chest wall: No tenderness.  Abdominal:     General: Abdomen is flat.     Palpations: Abdomen is soft.     Tenderness: There is no abdominal tenderness. There is no right CVA tenderness, left CVA tenderness, guarding or rebound.  Musculoskeletal:        General: Tenderness present. No swelling.     Cervical back: Neck supple.     Lumbar back: Tenderness present. Positive right straight leg raise test. Negative left straight leg raise test.     Comments: Surgical wound on back with a small protrusion of tissue that appears to be fat.  Some surrounding tenderness but no crepitance and no erythema.  No purulent drainage seen.  Skin:    General: Skin is warm and dry.     Capillary Refill: Capillary refill takes less than 2 seconds.  Neurological:     General: No focal deficit present.     Mental Status: She is alert.     Sensory: No sensory deficit.     Motor: No weakness.  Psychiatric:        Mood and Affect: Mood normal.        ED Results / Procedures / Treatments   Labs (all labs ordered are listed, but only abnormal results are displayed) Labs Reviewed  CBC WITH DIFFERENTIAL/PLATELET - Abnormal; Notable for the following components:      Result Value   WBC 3.8 (*)    RBC 3.51 (*)    Hemoglobin 11.0 (*)    HCT 31.6 (*)    All other components within normal limits  COMPREHENSIVE METABOLIC PANEL - Abnormal; Notable for the following components:   Potassium 3.4 (*)    Glucose, Bld 139 (*)    Total Protein 6.0 (*)    AST 13 (*)    All other components within normal limits  LACTIC ACID, PLASMA  SEDIMENTATION RATE  LACTIC ACID,  PLASMA    EKG None  Radiology DG Lumbar Spine Complete  Result Date: 09/05/2022 CLINICAL DATA:  Recent back surgery, small area of wound dehiscence with worsened pain and some drainage. Neurosurgery recommended plain film EXAM: LUMBAR SPINE - COMPLETE 4+ VIEW COMPARISON:  CT lumbar spine May 18, 2022. Intraoperative radiographs June 7 24. FINDINGS: Similar alignment. Posterior fusion extending from L2-S1 status post revision with interbody spacers. No obvious bony erosive change. Calcific atherosclerosis. Osteopenia. Multilevel degenerative change. Calcifications in the anatomic pelvis, likely fibroid. IMPRESSION: 1. No evidence of acute fracture, new  malalignment, or obvious bony erosive change. If there is concern for discitis/osteomyelitis, recommend MRI with contrast. 2. Lumbar posterior fusion status post revision. Electronically Signed   By: Feliberto Harts M.D.   On: 09/05/2022 16:20    Procedures Procedures    Medications Ordered in ED Medications - No data to display  ED Course/ Medical Decision Making/ A&P                             Medical Decision Making Amount and/or Complexity of Data Reviewed Labs: ordered. Radiology: ordered.    Patricia Parks is a 77 y.o. female with a past medical history significant for hypertension, hyperlipidemia, fibromyalgia, and previous lumbar spine surgery status post hardware revision on 08/10/2022 (about a month ago with Dr. Wynetta Emery) who presents with 4 days of worsening low back pain, wound drainage, and radicular pain.  According to patient, she had done well since her surgery until several days ago when she started having aching back pain.  She reports it is in her low back at the site of the surgery and she started noticing a moist wetness on her pants and underwear.  She reports the wound is draining in 1 spot where there is a small bulge of tissue coming out.  She reports the pain is moderate but is bothering her.  She has not yet  started physical therapy, that starts next week.  She says she is not having any new numbness or weakness in her legs but is having some pain coming from her low back down her right leg.  She reports it is not as bad is the pain she had before the surgery but was doing very well until the last several days.  She denies any other fevers, chills, congestion, cough, nausea, vomiting, constipation, diarrhea.  Does not report any incontinence.      On my exam, lungs are clear.  Chest nontender.  Abdomen nontender.  Patient has some mild tenderness near her surgical site and there is a small bulge that appears to be a bulb of fat poking near the wound.  No crepitance appreciated.  No large spreading erythematous rash.  No other drainage seen  Patient had intact sensation and strength in legs but did have a positive straight leg raise with pain going down the leg with right leg raise.  Otherwise she reports her exam is unchanged from her baseline.  I called neurosurgery given the recent procedure to discuss what workup they would appreciate [place] performing.  They recommended getting some labs and a sed rate and a plain film of the lumbar spine.  They did not feel that MRI was needed nor would a CT scan be needed at this time.  If the x-ray shows no prominent hardware and labs do not show critical abnormalities, they would recommend bandaging the area and follow-up in clinic with them.  Patient agrees this plan, anticipate reassessment after imaging and labs.  5:41 PM Workup returned reassuring.  Sed rate not elevated.  No leukocytosis.  X-ray showed no complication at this time.  Based on neurosurgery recommendations in this workup result, we feel patient is safe for discharge home.  We will put a new bandage on her back and for follow-up with her neurosurgery team.  Patient will get a short course of stronger pain medicine use for the next few days and will follow-up.  She agrees with plan of care and was  discharged in good condition.        Final Clinical Impression(s) / ED Diagnoses Final diagnoses:  Visit for wound check  Acute midline low back pain with right-sided sciatica    Rx / DC Orders ED Discharge Orders          Ordered    oxyCODONE-acetaminophen (PERCOCET/ROXICET) 5-325 MG tablet  Every 4 hours PRN        09/05/22 1744           Clinical Impression: 1. Visit for wound check   2. Acute midline low back pain with right-sided sciatica     Disposition: Discharge  Condition: Good  I have discussed the results, Dx and Tx plan with the pt(& family if present). He/she/they expressed understanding and agree(s) with the plan. Discharge instructions discussed at great length. Strict return precautions discussed and pt &/or family have verbalized understanding of the instructions. No further questions at time of discharge.    New Prescriptions   OXYCODONE-ACETAMINOPHEN (PERCOCET/ROXICET) 5-325 MG TABLET    Take 1 tablet by mouth every 4 (four) hours as needed for severe pain.    Follow Up: yout neurosurgery team        Rees Matura, Canary Brim, MD 09/05/22 1746

## 2022-09-05 NOTE — Discharge Instructions (Signed)
Your history, exam, and evaluation today showed a small area where some fat his pretreated from your wound.  There did not appear to be evidence of infection at this time.  X-ray was reassuring as requested by neurosurgery and your labs were also reassuring as we discussed.  We feel you are safe for discharge home for outpatient follow-up with neurosurgery and we escalate your pain regimen temporarily.  If any symptoms change or worsen acutely, please return to the nearest emergency department.

## 2022-09-05 NOTE — Progress Notes (Signed)
Patient ID: DACI ARNT, female   DOB: 07-May-1945, 77 y.o.   MRN: [id number] Wound picture reviewed. Advised Dr. Rush Landmark to obtain a CBC and a Sed rate. I also recommended she receive an ap and lateral xray.  We will arrange for her to be seen in the office.

## 2022-09-05 NOTE — ED Triage Notes (Signed)
Patient here POV from Home.  Endorses Reexploration of Fusion of L5-S1 about a Month ago. States it has been draining since Monday. Painful to Site. No Fevers. Drainage is more Bloody.   NAD Noted during triage. A&Ox4. Gcs 15. Ambulatory.

## 2022-09-07 LAB — CYTOLOGY - PAP
Comment: NEGATIVE
Diagnosis: NEGATIVE
High risk HPV: NEGATIVE

## 2022-09-11 ENCOUNTER — Other Ambulatory Visit: Payer: Self-pay

## 2022-09-11 MED ORDER — METOPROLOL SUCCINATE ER 50 MG PO TB24: 50.0000 mg | ORAL_TABLET | Freq: Every morning | ORAL | 3 refills | Status: DC

## 2022-09-14 ENCOUNTER — Encounter: Payer: Self-pay | Admitting: Neurosurgery

## 2022-09-14 ENCOUNTER — Other Ambulatory Visit: Payer: Self-pay | Admitting: Neurosurgery

## 2022-09-14 DIAGNOSIS — M5440 Lumbago with sciatica, unspecified side: Secondary | ICD-10-CM

## 2022-09-26 ENCOUNTER — Ambulatory Visit
Admission: RE | Admit: 2022-09-26 | Discharge: 2022-09-26 | Disposition: A | Discharge status: Home / Self Care | Payer: Medicare HMO | Source: Ambulatory Visit | Attending: Neurosurgery | Admitting: Neurosurgery

## 2022-09-26 DIAGNOSIS — M5440 Lumbago with sciatica, unspecified side: Secondary | ICD-10-CM

## 2022-10-04 ENCOUNTER — Other Ambulatory Visit: Payer: Self-pay | Admitting: Endocrinology

## 2022-10-04 DIAGNOSIS — E559 Vitamin D deficiency, unspecified: Secondary | ICD-10-CM

## 2022-10-09 ENCOUNTER — Ambulatory Visit (INDEPENDENT_AMBULATORY_CARE_PROVIDER_SITE_OTHER): Payer: Medicare HMO | Admitting: Behavioral Health

## 2022-10-09 DIAGNOSIS — F32A Depression, unspecified: Secondary | ICD-10-CM

## 2022-10-09 DIAGNOSIS — F419 Anxiety disorder, unspecified: Secondary | ICD-10-CM

## 2022-10-09 DIAGNOSIS — R52 Pain, unspecified: Secondary | ICD-10-CM
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
## 2022-10-09 NOTE — Progress Notes (Signed)
                 L , LMFT 

## 2022-10-15 ENCOUNTER — Ambulatory Visit
Admission: RE | Admit: 2022-10-15 | Discharge: 2022-10-15 | Disposition: A | Discharge status: Home / Self Care | Payer: Medicare HMO | Source: Ambulatory Visit | Attending: Endocrinology | Admitting: Endocrinology

## 2022-10-15 DIAGNOSIS — E559 Vitamin D deficiency, unspecified: Secondary | ICD-10-CM

## 2022-10-18 NOTE — Therapy (Signed)
OUTPATIENT PHYSICAL THERAPY THORACOLUMBAR EVALUATION   Patient Name: Patricia Parks MRN: [id number] DOB:1946-02-17, 77 y.o., female Today's Date: 10/23/2022  END OF SESSION:  PT End of Session - 10/23/22 1449     Visit Number 1    Number of Visits 16    Date for PT Re-Evaluation 12/18/22    Authorization Type [employer] MCR    Progress Note Due on Visit 10    PT Start Time 1145    PT Stop Time 1240    PT Time Calculation (min) 55 min    Activity Tolerance Patient limited by pain;Patient limited by fatigue    Behavior During Therapy [facility] for tasks assessed/performed             Past Medical History:  Diagnosis Date   Anxiety    Arthritis    Depression    Fatty liver    Fibromyalgia    Gout 04/2017   Hyperlipidemia    Hypertension    Hypothyroidism    pt no longer takes meds- just monitoring thyroid levels   Low kidney function    followed by Dr Nicholos Johns   Neuropathy    in both feet   Spinal stenosis    Past Surgical History:  Procedure Laterality Date   BACK SURGERY  09/13/2021   Carpel Tunnel Surgery Right 10/17/2018   both wrists   CATARACT EXTRACTION W/ INTRAOCULAR LENS IMPLANT Bilateral 10/25/2015   Right eye was first.  Left eye done 10/17.   CERVICAL BIOPSY  W/ LOOP ELECTRODE EXCISION  12/2008   hx CIN II   COLONOSCOPY     LAMINECTOMY WITH POSTERIOR LATERAL ARTHRODESIS LEVEL 1 Bilateral 08/10/2022   Procedure: Reexploration of Fusion Lumbar Five- Sacral One with Redo Postero-Lateral Fusion Replacement of Hardware and Posterio Lateral Arthodesis;  Surgeon: Donalee Citrin, MD;  Location: [facility] OR;  Service: Neurosurgery;  Laterality: Bilateral;   LUMBAR FUSION  2021   RADIOLOGY WITH ANESTHESIA N/A 07/26/2020   Procedure: MRI WITH ANESTHESIA  LUMBAR WITH AND WITHOUT CONTRAST;  Surgeon: Radiologist, Medication, MD;  Location: [facility] OR;  Service: Radiology;  Laterality: N/A;   Patient Active Problem List   Diagnosis Date Noted   Pseudoarthrosis of lumbar spine 08/10/2022    Mixed hyperlipidemia 10/20/2021   Palpitations 10/20/2021   Spinal stenosis of lumbar region 09/13/2021   Spondylolisthesis at L4-L5 level 01/13/2020   Pain in left knee 05/28/2017   Elevated lipids 12/30/2014   History of cervical dysplasia 12/30/2014   Essential hypertension 09/10/2013   Chest pain 08/28/2013    PCP: Darryll Capers, PA-C   REFERRING PROVIDER: Donalee Citrin, MD   REFERRING DIAG: S32.009K (ICD-10-CM) - Unspecified fracture of unspecified lumbar vertebra, subsequent encounter for fracture with nonunion  Lumbar Pseudoarthrosis  Rationale for Evaluation and Treatment: Rehabilitation  THERAPY DIAG:  Chronic bilateral low back pain with right-sided sciatica - Plan: PT plan of care cert/re-cert  Sacral back pain - Plan: PT plan of care cert/re-cert  Unsteadiness on feet - Plan: PT plan of care cert/re-cert  Muscle weakness (generalized) - Plan: PT plan of care cert/re-cert  ONSET DATE: 08-10-22 laminectomy  Bil PLIF L5 S1  SUBJECTIVE:  SUBJECTIVE STATEMENT: I had back surgery again in June and pain is not getting better. I have had 3 back surgeries now.  I have trouble walking and in my bil legs but I have R pain down into the bottom of my whole R foot.. Left leg pain also down to my feet. A neurologist Dr Allena Katz told me I have neuropathy. I have had fibromyalgia for about 15 years. My right leg makes wake up with pain at night. I try to sleep with legs between my  legs and one against my back for supports.  I wake up every 2-3 hours at night.  I walk up and down the hall.. I try to do Upper body exercise 2 x a week. I was a member of [employer]. I am unable to drive due to not being able to feel my legs  R worse than L  PERTINENT HISTORY:  Spinal stenosis, neuropathy in bil feet, low  kidney fx, Hypothyroidism HTN gout , Fibromyalgia, depression, OA. Osteopenia, lumbar fusion 2021, Laminectomy Bi PLIF L5/s1 08-10-22, carpal tunnel R 10-17-2018. Exploratory back surgery 07/23  PAIN:  Are you having pain? Yes: NPRS scale: at rest after a pain pill 1/10  but when moving or decrease pain  6/10    at very worst it would be 8-9/10/10 Pain location: Back pain on left is 6/10 and at worst 8-9/10  and down R leg   Pain description: Left back pain is stabbing.  Constantly  and   R leg pain wakes up at night with stabbing pain but during the day it is tingling Aggravating factors: walking, sleeping at night, sometimes pacing at night helps  but any moving  sitting max 30 min depending on chair.   Standing 10-15 min.  Walking  5 minutes Relieving factors: pacing sometimes for a short time  PRECAUTIONS: None  RED FLAGS: Bowel or bladder incontinence: No   WEIGHT BEARING RESTRICTIONS: No  FALLS:  Has patient fallen in last 6 months? No but pt reports feeling unsteady and stumbles at times  LIVING ENVIRONMENT: Lives with: lives with their spouse Lives in: House/apartment Stairs: Yes: External: 3 steps; on right going up Has following equipment at home: Single point cane grab bars  a 2 wheeled walker  OCCUPATION: retired  Customer service manager  PLOF: Independent with household mobility with device  PATIENT GOALS:  TPDN and I would like to be stronger and not feel feeble  NEXT MD VISIT: TBD  OBJECTIVE:   DIAGNOSTIC FINDINGS:  CLINICAL DATA:  Lumbar region back pain radiating to the right leg. Previous lumbosacral surgeries.   EXAM: CT LUMBAR SPINE WITHOUT CONTRAST   TECHNIQUE: Multidetector CT imaging of the lumbar spine was performed without intravenous contrast administration. Multiplanar CT image reconstructions were also generated.   RADIATION DOSE REDUCTION: This exam was performed according to the departmental dose-optimization program which includes  automated exposure control, adjustment of the mA and/or kV according to patient size and/or use of iterative reconstruction technique.   COMPARISON:  Radiography 09/05/2022.  CT 05/18/2022.   FINDINGS: Segmentation: 5 lumbar type vertebral bodies as numbered previously.   Alignment: Chronic fixed scoliotic curvature convex to the right. 2 mm degenerative retrolisthesis at L1-2 is unchanged.   Vertebrae: No new vertebral finding. See below for details at each level.   Paraspinal and other soft tissues: No significant finding.   Disc levels:   T11-12 and T12-L1: Normal   L1-2: Chronic degenerative retrolisthesis of 2 mm. Disc degeneration  with bulging of the disc worse on the left. Facet degeneration and hypertrophy. Stenosis of the lateral recesses and foramina, left worse than right. Similar appearance to the study of March.   L2-3 and L3-4: Previous posterior decompression, diskectomy and fusion. Solid union with sufficient patency of the canal and foramina.   L4-5: Previous posterior decompression, diskectomy and fusion. Solid union with sufficient patency of the canal and foramina.   L5-S1: Previous posterior decompression, diskectomy and fusion procedure. Continued evidence of nonunion with nitrogen gas in the disc space and lucency around the S1 screws, left more than right. Central canal as well decompressed. There is foraminal narrowing on the right as seen previously that could possibly affect the L5 nerve.   IMPRESSION: 1. No change since the study of March of this year. Previous posterior decompression, diskectomy and fusion procedures from L2-3 through L4-5 with solid union and sufficient patency of the canal and foramina. 2. Continued evidence of nonunion at the L5-S1 level with nitrogen gas in the disc space and lucency around the S1 screws, left more than right. No change in foraminal narrowing on the right at L5-S1. 3. L1-2 degenerative disc disease with  2 mm degenerative retrolisthesis. Stenosis of the lateral recesses and foramina, left worse than right. Similar appearance to the study of March.     Electronically Signed   By: Paulina Fusi M.D.   On: 10/07/2022 13:55  PATIENT SURVEYS:  FOTE  36%  predicted 43%  SCREENING FOR RED FLAGS: Bowel or bladder incontinence: No  COGNITION: Overall cognitive status: Within functional limits for tasks assessed     SENSATION: Pt with bil neuropathy and avoids driving car due to not being able to feel the brake pedal  MUSCLE LENGTH: Hamstrings:  WFL   POSTURE: rounded shoulders, forward head, and left pelvic level higher  Left anterior innominate rotation  PALPATION: Pt with Left SI sensitivity and + sacral compression and distraction test.  Pt with decreased pain with R LAD for R radiculopathy.  Pt with pain with palpation over Right Lumbar l-3 to S1 TTP over Left SI jt LUMBAR ROM:   AROM eval  Flexion Fingertips to floor  Extension 20% L low back  Right lateral flexion Fingertips 2 inch below knee jt line pulling pain on left  Left lateral flexion Fingertips to knee jt line pain on L  Right rotation 50%  Left rotation 50%   (Blank rows = not tested)  LOWER EXTREMITY ROM:   All WNL unless other wise noted  Active  Right eval Left eval  Hip flexion Standing 55  Standing  60  Hip extension    Hip abduction    Hip adduction    Hip internal rotation 20   Hip external rotation    Knee flexion    Knee extension    Ankle dorsiflexion    Ankle plantarflexion    Ankle inversion    Ankle eversion     (Blank rows = not tested)  LOWER EXTREMITY MMT:    MMT Right eval Left eval  Hip flexion 3- 4-  Hip extension 3- 4-  Hip abduction 3- 4-  Hip adduction    Hip internal rotation    Hip external rotation    Knee flexion 4 4  Knee extension 4 4  Ankle dorsiflexion    Ankle plantarflexion    Ankle inversion    Ankle eversion     (Blank rows = not tested)  LUMBAR  SPECIAL TESTS:  NT due to lumbar surgery in June 2024  FUNCTIONAL TESTS:  5 times sit to stand: 30.06 sec   49ft with SPC GAIT: Distance walked: 150 feet  then 99 ft for Assistive device utilized: Single point cane Level of assistance: Modified independence Comments: Pt walks cautiously and very slowly and unsteady , not confident with gait  TODAY'S TREATMENT:                                                                                                                              DATE: EVAL 10-23-22  issue HEP    PATIENT EDUCATION:  Education details: POC Explanation of findings, FOTO, issue HEP Person educated: Patient Education method: Explanation, Demonstration, Tactile cues, Verbal cues, and Handouts Education comprehension: verbalized understanding, returned demonstration, verbal cues required, tactile cues required, and needs further education  HOME EXERCISE PROGRAM: Access Code: [email or web address] URL: [email or web address] Date: 10/23/2022 Prepared by: Garen Lah  Exercises - Supine Pelvic Tilt  - 1 x daily - 7 x weekly - 3 sets - 10 reps - Supine Single Knee to Chest Stretch  - 2 x daily - 7 x weekly - 1 sets - 5 reps - 10 hold - Supine Lower Trunk Rotation  - 2 x daily - 7 x weekly - 1 sets - 5 reps - 20 hold - Sit to stand with sink support Movement snack  - 1 x daily - 7 x weekly - 3 sets - 10 reps  ASSESSMENT:  CLINICAL IMPRESSION: Patient is a 77  y.o. female who was seen today for physical therapy evaluation and treatment for continuing pain post surgeries (2) and most recent . Laminectomy Bil PLIF L5/S1 08-10-22, Pt has difficulty walking and remains sedentary due to pain. She has hx of fibromyalgia and bil neuropathy with complaints of pain in bil bottom of feet.  She reports she does UE exercises but she does not feel safe standing and walking. Ms Mauel sleep is disturbed and she cannot sleep for longer than 2 hours at night.  Pt arrives at  PT to try to get stronger and to improve her ability to move.  She seeks TPDN, HEP and aquatics to help her achieve her goals.  Pt also has some Positive signs of Left SI jt irritation. Pt will benefit from skilled PT to address her impairment and her immobility due to pain and weakness to return her to a more active lifestyle and continuance of healthy habits.  OBJECTIVE IMPAIRMENTS: decreased activity tolerance, decreased balance, decreased mobility, difficulty walking, decreased ROM, decreased strength, postural dysfunction, obesity, and pain.   ACTIVITY LIMITATIONS: bending, standing, squatting, sleeping, stairs, transfers, bed mobility, and locomotion level  PARTICIPATION LIMITATIONS: meal prep, cleaning, laundry, shopping, and community activity  PERSONAL FACTORS: Spinal stenosis, neuropathy in bil feet, low kidney fx, Hypothyroidism HTN gout , Fibromyalgia, depression, OA. Osteopenia, lumbar fusion 2021, Laminectomy Bi PLIF L5/s1 08-10-22, carpal tunnel R 10-17-2018. Exploratory back surgery 07/23  are also affecting patient's functional outcome.   REHAB POTENTIAL:Good but challenging due to bil neuropathy and multiple back surgeries  CLINICAL DECISION MAKING: Evolving/moderate complexity  EVALUATION COMPLEXITY: Moderate   GOALS: Goals reviewed with patient? Yes  SHORT TERM GOALS: Target date: 11-20-22  Pt will be I with initial HEP Baseline: Goal status: INITIAL  2.  Pt pain will be decreased  by 25% Baseline:  Goal status: INITIAL  3.  Pt will be able to sleep at night for 3 or more hours of consecutive sleep without awakening due to pain Baseline: wake every 2-3 hours Goal status: INITIAL  4.  Pt will be able to perform 6 MWT without fatigueing Baseline:  fatigued after 2 MWT  99 ft Goal status: INITIAL  5.  Pt will be able to be educated in aquatic therapy for exercise in environment with decreased joint loading.  Baseline: No knowledge about aquatics Goal status:  INITIAL   LONG TERM GOALS: Target date: 12-18-22  Pt will be able to be I with advanced HEP Baseline:  Goal status: INITIAL  2.  Pt pain will decrease for functional activities  and household chores by 50% Baseline: unable to stand for 5 min and cannot complete chores Goal status: INITIAL  3.  Pt will be able to perform 5 x STS with 15 sec or less to show increased LE strength Baseline: 30.06 sec Goal status: INITIAL  4.  will be able to lift 15# from the floor, not limited by pain, to allow completion of housework  Baseline: unable to lift items from floor Goal status: INITIAL  5.  Pt will report 4 or more hours of uninterrupted sleep due to pain Baseline: Awakes every 2-3 hours due to pain at night Goal status: INITIAL  6.  FOTO will improve from  36%  to 43%    indicating improved functional mobility.  Baseline: EVAL 36% Goal status: INITIAL  PLAN:  PT FREQUENCY: 1-2x/week  PT DURATION: 8 weeks  PLANNED INTERVENTIONS: Therapeutic exercises, Therapeutic activity, Neuromuscular re-education, Balance training, Gait training, Patient/Family education, Self Care, Joint mobilization, Stair training, DME instructions, Aquatic Therapy, Dry Needling, Electrical stimulation, Spinal mobilization, Cryotherapy, Moist heat, Taping, Manual therapy, and Re-evaluation.  PLAN FOR NEXT SESSION: TPDN. Aquatics  HEP  Left SI RX  Garen Lah, PT, ATRIC Certified Exercise Expert for the Aging Adult  10/23/22 4:31 PM Phone: [phone] Fax: [phone]

## 2022-10-23 ENCOUNTER — Ambulatory Visit: Payer: Medicare HMO | Attending: Neurosurgery | Admitting: Physical Therapy

## 2022-10-23 DIAGNOSIS — M6281 Muscle weakness (generalized): Secondary | ICD-10-CM

## 2022-10-23 DIAGNOSIS — R2681 Unsteadiness on feet: Secondary | ICD-10-CM | POA: Diagnosis present

## 2022-10-23 DIAGNOSIS — M5441 Lumbago with sciatica, right side: Secondary | ICD-10-CM | POA: Diagnosis not present

## 2022-10-23 DIAGNOSIS — G8929 Other chronic pain: Secondary | ICD-10-CM

## 2022-10-23 DIAGNOSIS — M533 Sacrococcygeal disorders, not elsewhere classified: Secondary | ICD-10-CM | POA: Diagnosis present

## 2022-10-23 NOTE — Therapy (Signed)
OUTPATIENT PHYSICAL THERAPY THORACOLUMBAR EVALUATION   Patient Name: Patricia Parks MRN: [id number] DOB:[birth year], 77 y.o., female Today's Date: 10/24/2022  END OF SESSION:  PT End of Session - 10/24/22 1457     Visit Number 2    Date for PT Re-Evaluation 12/18/22    Authorization Type [employer] MCR    Progress Note Due on Visit 10    PT Start Time 1500    PT Stop Time 1545    PT Time Calculation (min) 45 min    Activity Tolerance Patient limited by pain;Patient limited by fatigue    Behavior During Therapy [facility] for tasks assessed/performed              Past Medical History:  Diagnosis Date   Anxiety    Arthritis    Depression    Fatty liver    Fibromyalgia    Gout 04/2017   Hyperlipidemia    Hypertension    Hypothyroidism    pt no longer takes meds- just monitoring thyroid levels   Low kidney function    followed by Dr Nicholos Johns   Neuropathy    in both feet   Spinal stenosis    Past Surgical History:  Procedure Laterality Date   BACK SURGERY  09/13/2021   Carpel Tunnel Surgery Right 10/17/2018   both wrists   CATARACT EXTRACTION W/ INTRAOCULAR LENS IMPLANT Bilateral 10/25/2015   Right eye was first.  Left eye done 10/17.   CERVICAL BIOPSY  W/ LOOP ELECTRODE EXCISION  12/2008   hx CIN II   COLONOSCOPY     LAMINECTOMY WITH POSTERIOR LATERAL ARTHRODESIS LEVEL 1 Bilateral 08/10/2022   Procedure: Reexploration of Fusion Lumbar Five- Sacral One with Redo Postero-Lateral Fusion Replacement of Hardware and Posterio Lateral Arthodesis;  Surgeon: Donalee Citrin, MD;  Location: [facility] OR;  Service: Neurosurgery;  Laterality: Bilateral;   LUMBAR FUSION  2021   RADIOLOGY WITH ANESTHESIA N/A 07/26/2020   Procedure: MRI WITH ANESTHESIA  LUMBAR WITH AND WITHOUT CONTRAST;  Surgeon: Radiologist, Medication, MD;  Location: [facility] OR;  Service: Radiology;  Laterality: N/A;   Patient Active Problem List   Diagnosis Date Noted   Pseudoarthrosis of lumbar spine 08/10/2022   Mixed hyperlipidemia  10/20/2021   Palpitations 10/20/2021   Spinal stenosis of lumbar region 09/13/2021   Spondylolisthesis at L4-L5 level 01/13/2020   Pain in left knee 05/28/2017   Elevated lipids 12/30/2014   History of cervical dysplasia 12/30/2014   Essential hypertension 09/10/2013   Chest pain 08/28/2013    PCP: Darryll Capers, PA-C   REFERRING PROVIDER: Donalee Citrin, MD   REFERRING DIAG: S32.009K (ICD-10-CM) - Unspecified fracture of unspecified lumbar vertebra, subsequent encounter for fracture with nonunion  Lumbar Pseudoarthrosis  Rationale for Evaluation and Treatment: Rehabilitation  THERAPY DIAG:  Chronic bilateral low back pain with right-sided sciatica  Sacral back pain  Unsteadiness on feet  Muscle weakness (generalized)  ONSET DATE: 08-10-22 laminectomy  Bil PLIF L5 S1  SUBJECTIVE:  SUBJECTIVE STATEMENT: Pt reports having a spasm in leg last night and she is a 7/10 today.  Also reports 4/10 at end of session.  EVAL-I had back surgery again in June and pain is not getting better. I have had 3 back surgeries now.  I have trouble walking and in my bil legs but I have R pain down into the bottom of my whole R foot.. Left leg pain also down to my feet. A neurologist Dr Allena Katz told me I have neuropathy. I have had fibromyalgia for about 15 years. My right leg makes wake up with pain at night. I try to sleep with legs between my  legs and one against my back for supports.  I wake up every 2-3 hours at night.  I walk up and down the hall.. I try to do Upper body exercise 2 x a week. I was a member of [employer]. I am unable to drive due to not being able to feel my legs  R worse than L  PERTINENT HISTORY:  Spinal stenosis, neuropathy in bil feet, low kidney fx, Hypothyroidism HTN gout , Fibromyalgia,  depression, OA. Osteopenia, lumbar fusion 2021, Laminectomy Bi PLIF L5/s1 08-10-22, carpal tunnel R 10-17-2018. Exploratory back surgery 07/23  PAIN:  Are you having pain? Yes: NPRS scale: at rest after a pain pill 1/10  but when moving or decrease pain  6/10    at very worst it would be 8-9/10/10 Pain location: Back pain on left is 6/10 and at worst 8-9/10  and down R leg   Pain description: Left back pain is stabbing.  Constantly  and   R leg pain wakes up at night with stabbing pain but during the day it is tingling Aggravating factors: walking, sleeping at night, sometimes pacing at night helps  but any moving  sitting max 30 min depending on chair.   Standing 10-15 min.  Walking  5 minutes Relieving factors: pacing sometimes for a short time  PRECAUTIONS: None  RED FLAGS: Bowel or bladder incontinence: No   WEIGHT BEARING RESTRICTIONS: No  FALLS:  Has patient fallen in last 6 months? No but pt reports feeling unsteady and stumbles at times  LIVING ENVIRONMENT: Lives with: lives with their spouse Lives in: House/apartment Stairs: Yes: External: 3 steps; on right going up Has following equipment at home: Single point cane grab bars  a 2 wheeled walker  OCCUPATION: retired  Customer service manager  PLOF: Independent with household mobility with device  PATIENT GOALS:  TPDN and I would like to be stronger and not feel feeble  NEXT MD VISIT: TBD  OBJECTIVE:   DIAGNOSTIC FINDINGS:  CLINICAL DATA:  Lumbar region back pain radiating to the right leg. Previous lumbosacral surgeries.   EXAM: CT LUMBAR SPINE WITHOUT CONTRAST   TECHNIQUE: Multidetector CT imaging of the lumbar spine was performed without intravenous contrast administration. Multiplanar CT image reconstructions were also generated.   RADIATION DOSE REDUCTION: This exam was performed according to the departmental dose-optimization program which includes automated exposure control, adjustment of the mA and/or kV  according to patient size and/or use of iterative reconstruction technique.   COMPARISON:  Radiography 09/05/2022.  CT 05/18/2022.   FINDINGS: Segmentation: 5 lumbar type vertebral bodies as numbered previously.   Alignment: Chronic fixed scoliotic curvature convex to the right. 2 mm degenerative retrolisthesis at L1-2 is unchanged.   Vertebrae: No new vertebral finding. See below for details at each level.   Paraspinal and other soft tissues:  No significant finding.   Disc levels:   T11-12 and T12-L1: Normal   L1-2: Chronic degenerative retrolisthesis of 2 mm. Disc degeneration with bulging of the disc worse on the left. Facet degeneration and hypertrophy. Stenosis of the lateral recesses and foramina, left worse than right. Similar appearance to the study of March.   L2-3 and L3-4: Previous posterior decompression, diskectomy and fusion. Solid union with sufficient patency of the canal and foramina.   L4-5: Previous posterior decompression, diskectomy and fusion. Solid union with sufficient patency of the canal and foramina.   L5-S1: Previous posterior decompression, diskectomy and fusion procedure. Continued evidence of nonunion with nitrogen gas in the disc space and lucency around the S1 screws, left more than right. Central canal as well decompressed. There is foraminal narrowing on the right as seen previously that could possibly affect the L5 nerve.   IMPRESSION: 1. No change since the study of March of this year. Previous posterior decompression, diskectomy and fusion procedures from L2-3 through L4-5 with solid union and sufficient patency of the canal and foramina. 2. Continued evidence of nonunion at the L5-S1 level with nitrogen gas in the disc space and lucency around the S1 screws, left more than right. No change in foraminal narrowing on the right at L5-S1. 3. L1-2 degenerative disc disease with 2 mm degenerative retrolisthesis. Stenosis of the lateral  recesses and foramina, left worse than right. Similar appearance to the study of March.     Electronically Signed   By: Paulina Fusi M.D.   On: 10/07/2022 13:55  PATIENT SURVEYS:  FOTE  36%  predicted 43%  SCREENING FOR RED FLAGS: Bowel or bladder incontinence: No  COGNITION: Overall cognitive status: Within functional limits for tasks assessed     SENSATION: Pt with bil neuropathy and avoids driving car due to not being able to feel the brake pedal  MUSCLE LENGTH: Hamstrings:  WFL   POSTURE: rounded shoulders, forward head, and left pelvic level higher  Left anterior innominate rotation  PALPATION: Pt with Left SI sensitivity and + sacral compression and distraction test.  Pt with decreased pain with R LAD for R radiculopathy.  Pt with pain with palpation over Right Lumbar l-3 to S1 TTP over Left SI jt LUMBAR ROM:   AROM eval  Flexion Fingertips to floor  Extension 20% L low back  Right lateral flexion Fingertips 2 inch below knee jt line pulling pain on left  Left lateral flexion Fingertips to knee jt line pain on L  Right rotation 50%  Left rotation 50%   (Blank rows = not tested)  LOWER EXTREMITY ROM:   All WNL unless other wise noted  Active  Right eval Left eval  Hip flexion Standing 55  Standing  60  Hip extension    Hip abduction    Hip adduction    Hip internal rotation 20   Hip external rotation    Knee flexion    Knee extension    Ankle dorsiflexion    Ankle plantarflexion    Ankle inversion    Ankle eversion     (Blank rows = not tested)  LOWER EXTREMITY MMT:    MMT Right eval Left eval  Hip flexion 3- 4-  Hip extension 3- 4-  Hip abduction 3- 4-  Hip adduction    Hip internal rotation    Hip external rotation    Knee flexion 4 4  Knee extension 4 4  Ankle dorsiflexion    Ankle plantarflexion  Ankle inversion    Ankle eversion     (Blank rows = not tested)  LUMBAR SPECIAL TESTS:  NT due to lumbar surgery in June  2024  FUNCTIONAL TESTS:  5 times sit to stand: 30.06 sec   52ft with SPC GAIT: Distance walked: 150 feet  then 99 ft for Assistive device utilized: Single point cane Level of assistance: Modified independence Comments: Pt walks cautiously and very slowly and unsteady , not confident with gait  TODAY'S TREATMENT:    [facility] Adult PT Treatment:                                                DATE: 10-24-22 Pt enters building ambulating independently. Treatment took place in water 3.8 to  4 ft 8 in.feet deep depending upon activity.  Pt entered and exited the pool via stair and handrails independently. Pt initiated Rx with 7/10 pain. At end of session 4/10  Acclimating to water by walking back and forth and side stepping using rainbow DB in bil UE for security and balance in water Ms.  Arai was educated on  beneficial therapeutic effects of water while ambulating to acclimate to water walking forward, backward and side stepping.  Pt educated on neutral posture and hip hinging in seated position with water at chest level x 10 with stretch to low back and then x 10 with back at pool wall at external cue, VC for neck tucked to prevent hyperextension.  Also reinforced with STS on submerge bench and with submerged step with 90/90 hip for greater depth of squat. Aquatic Exercise:   Pt hip hinge using pool noodle and then gentle lumbar rotataion Thoracic AROM at edge of pool Hip abduction/adduction x10 BIL Hip ext/flex with knee straight R x 20   Standing back stretch with  UE on edge of pool  KB up and down press   Squats x 20 Heel and toe raises Toe yoga in pool Ambulating forward, backward and side stepping with CGA x 1 at beginning of session and then I with rainbow DB at end of session for 5 rounds of 30 feet. each      Pt requires the buoyancy of water for active assisted exercises with buoyancy supported for strengthening and AROM exercises. Hydrostatic pressure also supports  joints by unweighting joint load by at least 50 % in 3-4 feet depth water. 80% in chest to neck deep water. Water will provide assistance with movement using the current and laminar flow while the buoyancy reduces weight bearing. Pt requires the viscosity of the water for resistance endurance  with strengthening exercises.                                                                                                                              DATE:  EVAL 10-23-22  issue HEP    PATIENT EDUCATION:  Education details: POC Explanation of findings, FOTO, issue HEP Person educated: Patient Education method: Explanation, Demonstration, Tactile cues, Verbal cues, and Handouts Education comprehension: verbalized understanding, returned demonstration, verbal cues required, tactile cues required, and needs further education  HOME EXERCISE PROGRAM: Access Code: [email or web address] URL: [email or web address] Date: 10/23/2022 Prepared by: Garen Lah  Exercises - Supine Pelvic Tilt  - 1 x daily - 7 x weekly - 3 sets - 10 reps - Supine Single Knee to Chest Stretch  - 2 x daily - 7 x weekly - 1 sets - 5 reps - 10 hold - Supine Lower Trunk Rotation  - 2 x daily - 7 x weekly - 1 sets - 5 reps - 20 hold - Sit to stand with sink support Movement snack  - 1 x daily - 7 x weekly - 3 sets - 10 reps  ASSESSMENT:  CLINICAL IMPRESSION: Session today focused on  introduction to therapeutic benefits of water and hip hinge/back stretch and  hip strength in the aquatic environment for use of buoyancy to offload joints and the viscosity of water as resistance during therapeutic exercise.  We were able to progress aquatic exercise today to include more dynamic core stabilization and higher load hip ext.  Patient was able to tolerate all prescribed exercises in the aquatic environment with no adverse effects and reports 4/10 pain at the end of the session. Patient continues to benefit from skilled PT services on  land and aquatic based and should be progressed as able to improve functional independence   EVAL- Patient is a 77  y.o. female who was seen today for physical therapy evaluation and treatment for continuing pain post surgeries (2) and most recent . Laminectomy Bil PLIF L5/S1 08-10-22, Pt has difficulty walking and remains sedentary due to pain. She has hx of fibromyalgia and bil neuropathy with complaints of pain in bil bottom of feet.  She reports she does UE exercises but she does not feel safe standing and walking. Ms Jarrell sleep is disturbed and she cannot sleep for longer than 2 hours at night.  Pt arrives at PT to try to get stronger and to improve her ability to move.  She seeks TPDN, HEP and aquatics to help her achieve her goals.  Pt also has some Positive signs of Left SI jt irritation. Pt will benefit from skilled PT to address her impairment and her immobility due to pain and weakness to return her to a more active lifestyle and continuance of healthy habits.  OBJECTIVE IMPAIRMENTS: decreased activity tolerance, decreased balance, decreased mobility, difficulty walking, decreased ROM, decreased strength, postural dysfunction, obesity, and pain.   ACTIVITY LIMITATIONS: bending, standing, squatting, sleeping, stairs, transfers, bed mobility, and locomotion level  PARTICIPATION LIMITATIONS: meal prep, cleaning, laundry, shopping, and community activity  PERSONAL FACTORS: Spinal stenosis, neuropathy in bil feet, low kidney fx, Hypothyroidism HTN gout , Fibromyalgia, depression, OA. Osteopenia, lumbar fusion 2021, Laminectomy Bi PLIF L5/s1 08-10-22, carpal tunnel R 10-17-2018. Exploratory back surgery 07/23 are also affecting patient's functional outcome.   REHAB POTENTIAL:Good but challenging due to bil neuropathy and multiple back surgeries  CLINICAL DECISION MAKING: Evolving/moderate complexity  EVALUATION COMPLEXITY: Moderate   GOALS: Goals reviewed with patient? Yes  SHORT TERM GOALS:  Target date: 11-20-22  Pt will be I with initial HEP Baseline: Goal status: INITIAL  2.  Pt pain will be decreased  by 25% Baseline:  Goal status: INITIAL  3.  Pt will be able to sleep at night for 3 or more hours of consecutive sleep without awakening due to pain Baseline: wake every 2-3 hours Goal status: INITIAL  4.  Pt will be able to perform 6 MWT without fatigueing Baseline:  fatigued after 2 MWT  99 ft Goal status: INITIAL  5.  Pt will be able to be educated in aquatic therapy for exercise in environment with decreased joint loading.  Baseline: No knowledge about aquatics Goal status: INITIAL   LONG TERM GOALS: Target date: 12-18-22  Pt will be able to be I with advanced HEP Baseline:  Goal status: INITIAL  2.  Pt pain will decrease for functional activities  and household chores by 50% Baseline: unable to stand for 5 min and cannot complete chores Goal status: INITIAL  3.  Pt will be able to perform 5 x STS with 15 sec or less to show increased LE strength Baseline: 30.06 sec Goal status: INITIAL  4.  will be able to lift 15# from the floor, not limited by pain, to allow completion of housework  Baseline: unable to lift items from floor Goal status: INITIAL  5.  Pt will report 4 or more hours of uninterrupted sleep due to pain Baseline: Awakes every 2-3 hours due to pain at night Goal status: INITIAL  6.  FOTO will improve from  36%  to 43%    indicating improved functional mobility.  Baseline: EVAL 36% Goal status: INITIAL  PLAN:  PT FREQUENCY: 1-2x/week  PT DURATION: 8 weeks  PLANNED INTERVENTIONS: Therapeutic exercises, Therapeutic activity, Neuromuscular re-education, Balance training, Gait training, Patient/Family education, Self Care, Joint mobilization, Stair training, DME instructions, Aquatic Therapy, Dry Needling, Electrical stimulation, Spinal mobilization, Cryotherapy, Moist heat, Taping, Manual therapy, and Re-evaluation.  PLAN FOR NEXT  SESSION: TPDN. Aquatics  HEP  Left SI RX  Garen Lah, PT, ATRIC Certified Exercise Expert for the Aging Adult  10/24/22 4:34 PM Phone: [phone] Fax: [phone]

## 2022-10-23 NOTE — Patient Instructions (Signed)
Aquatic Therapy at [facility]-  What to Expect!  Where:   [facility] [address] [place], [place] [address] Rehab phone [phone]  NOTE:  You will receive an automated phone message reminding you of your appt and it will say the appointment is at the [address] [facility] clinic.          How to Prepare: Please make sure you drink 8 ounces of water about one hour prior to your pool session A caregiver may attend if needed with the patient to help assist as needed. A caregiver can sit in the pool room on chair. Please arrive IN YOUR SUIT and 15 minutes prior to your appointment - this helps to avoid delays in starting your session. Please make sure to attend to any toileting needs prior to entering the pool [facility] rooms for changing are provided.   There is direct access to the pool deck form the locker room.  You can lock your belongings in a locker with lock provided. Once on the pool deck your therapist will ask if you have signed the Patient  Consent and Assignment of Benefits form before beginning treatment Your therapist may take your blood pressure prior to, during and after your session if indicated We usually try and create a home exercise program based on activities we do in the pool.  Please be thinking about who might be able to assist you in the pool should you need to participate in an aquatic home exercise program at the time of discharge if you need assistance.  Some patients do not want to or do not have the ability to participate in an aquatic home program - this is not a barrier in any way to you participating in aquatic therapy as part of your current therapy plan! After Discharge from PT, you can continue using home program at  the [facility], there is a drop-in fee for $5 ($45 a month)or for 60 years  or older $4.00 ($40 a month for seniors ) or any local [facility] pool.  Memberships for purchase are  available for gym/pool at [facility]  IT IS VERY IMPORTANT THAT YOUR LAST VISIT BE IN THE CLINIC AT [facility] [address] AFTER YOUR LAST AQUATIC VISIT.  PLEASE MAKE SURE THAT YOU HAVE A [address]  APPOINTMENT SCHEDULED.   About the pool: Pool is located approximately 500 FT from the entrance of the building.  Please bring a support person if you need assistance traveling this      distance.   Your therapist will assist you in entering the water; there are two ways to           enter: stairs with railings, and a mechanical lift. Your therapist will determine the most appropriate way for you.  Water temperature is usually between 88-90 degrees  There may be up to 2 other swimmers in the pool at the same time  The pool deck is tile, please wear shoes with good traction if you prefer not to be barefoot.    Contact Info:  For appointment scheduling and cancellations:         Please call the [facility]  [phone]              Aquatic Therapy  Outpatient Rehabilitation @ [facility]       All sessions are 45 minutes  Garen Lah, PT, ATRIC Certified Exercise Expert for the Aging Adult  10/23/22 12:39 PM Phone: [phone] Fax: [phone]

## 2022-10-24 ENCOUNTER — Ambulatory Visit: Payer: Medicare HMO | Admitting: Physical Therapy

## 2022-10-24 ENCOUNTER — Encounter: Payer: Self-pay | Admitting: Physical Therapy

## 2022-10-24 DIAGNOSIS — M5441 Lumbago with sciatica, right side: Secondary | ICD-10-CM | POA: Diagnosis not present

## 2022-10-24 DIAGNOSIS — M533 Sacrococcygeal disorders, not elsewhere classified: Secondary | ICD-10-CM

## 2022-10-24 DIAGNOSIS — M6281 Muscle weakness (generalized): Secondary | ICD-10-CM

## 2022-10-24 DIAGNOSIS — G8929 Other chronic pain: Secondary | ICD-10-CM

## 2022-10-24 DIAGNOSIS — R2681 Unsteadiness on feet: Secondary | ICD-10-CM

## 2022-10-26 ENCOUNTER — Ambulatory Visit: Payer: Medicare HMO | Admitting: Physical Therapy

## 2022-10-30 ENCOUNTER — Ambulatory Visit: Payer: Medicare HMO | Admitting: Physical Therapy

## 2022-10-30 ENCOUNTER — Ambulatory Visit: Payer: Medicare HMO | Admitting: Behavioral Health

## 2022-10-30 NOTE — Progress Notes (Unsigned)
                Victoria L Winstead, LMFT 

## 2022-10-31 ENCOUNTER — Ambulatory Visit: Payer: Medicare HMO | Admitting: Cardiology

## 2022-10-31 ENCOUNTER — Encounter: Payer: Self-pay | Admitting: Cardiology

## 2022-10-31 VITALS — BP 153/84 | HR 74 | Resp 16 | Ht 65.0 in | Wt 194.4 lb

## 2022-10-31 DIAGNOSIS — R002 Palpitations: Secondary | ICD-10-CM

## 2022-10-31 DIAGNOSIS — I10 Essential (primary) hypertension: Secondary | ICD-10-CM

## 2022-10-31 MED ORDER — AMLODIPINE BESYLATE 5 MG PO TABS
5.0000 mg | ORAL_TABLET | Freq: Every day | ORAL | 2 refills | Status: DC
Start: 2022-10-31 — End: 2023-01-28

## 2022-10-31 NOTE — Progress Notes (Signed)
Primary Physician/Referring:  Darryll Capers, PA-C  [name], female    DOB: Jul 29, 1945, 77 y.o.   MRN: [id number]  Chief Complaint  Patient presents with   Bilateral leg edema   Follow-up    6 months   HPI:    Patricia Parks  is a 77 y.o. Caucasian female patient with fibromyalgia, anxiety and depression, hypercholesterolemia and hypertension, palpitations presents here for 47-month office visit.  Since back surgery, patient states that she still continues to severe pain and has been sick and has been on Percocet almost on every 4 hour basis due to severe pain.  She denies palpitations, chest pain, dyspnea.  No leg edema.  Past Medical History:  Diagnosis Date   Anxiety    Arthritis    Depression    Fatty liver    Fibromyalgia    Gout 04/2017   Hyperlipidemia    Hypertension    Hypothyroidism    pt no longer takes meds- just monitoring thyroid levels   Low kidney function    followed by Dr Nicholos Johns   Neuropathy    in both feet   Spinal stenosis    Past Surgical History:  Procedure Laterality Date   BACK SURGERY  09/13/2021   Carpel Tunnel Surgery Right 10/17/2018   both wrists   CATARACT EXTRACTION W/ INTRAOCULAR LENS IMPLANT Bilateral 10/25/2015   Right eye was first.  Left eye done 10/17.   CERVICAL BIOPSY  W/ LOOP ELECTRODE EXCISION  12/2008   hx CIN II   COLONOSCOPY     LAMINECTOMY WITH POSTERIOR LATERAL ARTHRODESIS LEVEL 1 Bilateral 08/10/2022   Procedure: Reexploration of Fusion Lumbar Five- Sacral One with Redo Postero-Lateral Fusion Replacement of Hardware and Posterio Lateral Arthodesis;  Surgeon: Donalee Citrin, MD;  Location: [facility] OR;  Service: Neurosurgery;  Laterality: Bilateral;   LUMBAR FUSION  2021   RADIOLOGY WITH ANESTHESIA N/A 07/26/2020   Procedure: MRI WITH ANESTHESIA  LUMBAR WITH AND WITHOUT CONTRAST;  Surgeon: Radiologist, Medication, MD;  Location: [facility] OR;  Service: Radiology;  Laterality: N/A;   Family History  Problem Relation Age of  Onset   Osteoporosis Mother    Hypertension Mother    Uterine cancer Mother    Heart disease Mother        Atrial fibrillation   Breast cancer Sister 31   Osteoporosis Sister    Diabetes Sister    Diabetes Father    Heart disease Sister     Social History   Tobacco Use   Smoking status: Former    Current packs/day: 0.00    Average packs/day: 0.3 packs/day for 5.0 years (1.3 ttl pk-yrs)    Types: Cigarettes    Start date: 89    Quit date: 78    Years since quitting: 49.6   Smokeless tobacco: Never   Tobacco comments:    in the 68s  Substance Use Topics   Alcohol use: Yes    Comment: maybe 1 glass of wine per month   Marital Status: Married  ROS  Review of Systems  Cardiovascular:  Negative for chest pain, dyspnea on exertion and leg swelling.   Objective      10/31/2022   11:22 AM 09/05/2022    5:45 PM 09/05/2022    3:30 PM  Vitals with BMI  Height 5\' 5"     Weight 194 lbs 6 oz    BMI 32.35    Systolic 153  107  Diastolic 84  58  Pulse  74 58 63   Blood pressure (!) 153/84, pulse 74, resp. rate 16, height 5\' 5"  (1.651 m), weight 194 lb 6.4 oz (88.2 kg), last menstrual period 03/05/2000, SpO2 98%.   Physical Exam Neck:     Vascular: No carotid bruit or JVD.  Cardiovascular:     Rate and Rhythm: Normal rate and regular rhythm.     Pulses: Intact distal pulses.     Heart sounds: Normal heart sounds. No murmur heard.    No gallop.  Pulmonary:     Effort: Pulmonary effort is normal.     Breath sounds: Normal breath sounds.  Abdominal:     General: Bowel sounds are normal.     Palpations: Abdomen is soft.  Musculoskeletal:     Right lower leg: No edema.     Left lower leg: No edema.    Laboratory examination:   Recent Labs    07/17/22 1153 09/05/22 1535  NA 138 139  K 3.6 3.4*  CL 103 105  CO2 27 24  GLUCOSE 120* 139*  BUN 13 19  CREATININE 0.80 0.80  CALCIUM 9.9 9.4  GFRNONAA >60 >60    Lab Results  Component Value Date   GLUCOSE 139 (H)  09/05/2022   NA 139 09/05/2022   K 3.4 (L) 09/05/2022   CL 105 09/05/2022   CO2 24 09/05/2022   BUN 19 09/05/2022   CREATININE 0.80 09/05/2022   GFRNONAA >60 09/05/2022   CALCIUM 9.4 09/05/2022   PROT 6.0 (L) 09/05/2022   ALBUMIN 4.2 09/05/2022   LABGLOB 2.2 10/22/2018   AGRATIO 2.1 10/22/2018   BILITOT 1.1 09/05/2022   ALKPHOS 47 09/05/2022   AST 13 (L) 09/05/2022   ALT 8 09/05/2022   ANIONGAP 10 09/05/2022      Lab Results  Component Value Date   ALT 8 09/05/2022   AST 13 (L) 09/05/2022   ALKPHOS 47 09/05/2022   BILITOT 1.1 09/05/2022       Latest Ref Rng & Units 09/05/2022    3:35 PM 07/17/2022   11:53 AM 09/11/2021    3:49 PM  CBC  WBC 4.0 - 10.5 K/uL 3.8  4.4  3.8   Hemoglobin 12.0 - 15.0 g/dL 09.8  11.9  14.7   Hematocrit 36.0 - 46.0 % 31.6  36.2  36.0   Platelets 150 - 400 K/uL 230  250  194        Latest Ref Rng & Units 09/05/2022    3:35 PM 07/17/2022   11:53 AM 07/24/2021    2:40 PM  Hepatic Function  Total Protein 6.5 - 8.1 g/dL 6.0  6.8  5.9   Albumin 3.5 - 5.0 g/dL 4.2  4.6  3.8   AST 15 - 41 U/L 13  15  25    ALT 0 - 44 U/L 8  12  15    Alk Phosphatase 38 - 126 U/L 47  51  47   Total Bilirubin 0.3 - 1.2 mg/dL 1.1  1.1  1.8   TSH Recent Labs    07/17/22 1153  TSH 2.306   Radiology:    Cardiac Studies:   PCV ECHOCARDIOGRAM COMPLETE 07/11/2020  Narrative Echocardiogram 07/11/2020: Left ventricle cavity is normal in size. Mild concentric hypertrophy of the left ventricle. Normal global wall motion. Normal LV systolic function with EF 59%. Doppler evidence of grade I (impaired) diastolic dysfunction, normal LAP. Mild pulmonic regurgitation. No evidence of pulmonary hypertension.    Lexiscan Myoview Stress Test 08/06/2018: Nondiagnostic ECG  stress due to pharmacologic stress protocol. Normal myocardial perfusion.  Stress LV EF: 72%.  Low risk study.   Carotid artery duplex 06/14/2021: No evidence of significant stenosis in the right carotid  vessels. Duplex suggests stenosis in the left internal carotid artery (16-49%). There is no significant plaque burden. This study may represent FMD (mild). Antegrade right vertebral artery flow. Antegrade left vertebral artery flow. No significant change 06/07/2020.  EKG:   EKG 10/31/2022: Normal sinus rhythm at rate of 68 bpm, left atrial enlargement, right otherwise normal EKG.  Compared to 07/25/2022, no significant change.    Medications and allergies  No Known Allergies  Medication list   Current Outpatient Medications:    Aflibercept (EYLEA IO), Place 1 Syringe into the left eye See admin instructions. Every 12 weeks, Disp: , Rfl:    allopurinol (ZYLOPRIM) 100 MG tablet, Take 100 mg by mouth in the morning., Disp: , Rfl:    chlorthalidone (HYGROTON) 25 MG tablet, TAKE 1/2 TABLET(12.5 MG) BY MOUTH DAILY, Disp: 45 tablet, Rfl: 3   diclofenac Sodium (VOLTAREN) 1 % GEL, Apply 1 Application topically 4 (four) times daily as needed (pain.)., Disp: , Rfl:    HYDROcodone-acetaminophen (NORCO) 10-325 MG tablet, Take 1 tablet by mouth every 4 (four) hours as needed for moderate pain (Postop pain control)., Disp: 30 tablet, Rfl: 0   losartan (COZAAR) 100 MG tablet, Take 1 tablet (100 mg total) by mouth daily. (Patient taking differently: Take 100 mg by mouth every evening.), Disp: 90 tablet, Rfl: 3   metoprolol succinate (TOPROL-XL) 50 MG 24 hr tablet, Take 1 tablet (50 mg total) by mouth in the morning. Take with or immediately following a meal., Disp: 30 tablet, Rfl: 3   OVER THE COUNTER MEDICATION, Apply 1 application  topically 2 (two) times daily as needed (pain). CBD Oil for back and feet pain, Disp: , Rfl:    rosuvastatin (CRESTOR) 10 MG tablet, TAKE 1 TABLET(10 MG) BY MOUTH DAILY, Disp: 90 tablet, Rfl: 3   tobramycin (TOBREX) 0.3 % ophthalmic solution, Place 1 drop into the left eye as directed. Instill 1 drop into left eye 4 times daily--the day before, the day of, and the day after eylea  eye injection., Disp: , Rfl:   Assessment     ICD-10-CM   1. Palpitations  R00.2 EKG 12-Lead    2. Essential hypertension  I10     3. Mixed hyperlipidemia  E78.2        Orders Placed This Encounter  Procedures   EKG 12-Lead    No orders of the defined types were placed in this encounter.   Medications Discontinued During This Encounter  Medication Reason   methocarbamol (ROBAXIN) 500 MG tablet    oxyCODONE-acetaminophen (PERCOCET/ROXICET) 5-325 MG tablet      Recommendations:   Patricia Parks is a 77 y.o. Caucasian female patient with fibromyalgia, anxiety and depression, hypercholesterolemia and hypertension, palpitations presents here for 93-month office visit.   1. Palpitations Since being on beta-blocker therapy, she has not had any further palpitations. - EKG 12-Lead  2. Essential hypertension Blood pressure is elevated today, I have added amlodipine 5 mg daily.  Advised her to follow-up with her PCP for hypertension management, as she is in severe back pain, she is now taking Percocet every 4-6 hours, this may have contributed to elevated blood pressure as well.  I also advised her to monitor her blood pressure closely at home when she is comfortable and take her home recordings to  her PCP for review.  Otherwise from cardiac standpoint, she is stable, she has had a low risk nuclear stress test in 2020 and essentially normal echocardiogram at the same time.  She has had carotid artery duplex revealing very mild stenosis in the left carotid artery which appears to be due to tortuosity than true stenosis as there was no evidence of plaque burden seen, also unlikely but could have FMD.  I am not suspecting her hypertension is related to renal artery FMD as her blood pressure previously was very well-controlled when she was not having back pain issues.  I will see her back on a as needed basis.  - amLODipine (NORVASC) 5 MG tablet; Take 1 tablet (5 mg total) by mouth  daily.  Dispense: 30 tablet; Refill: 2    Yates Decamp, MD, [facility] 10/31/2022, 11:53 AM Office: [phone]

## 2022-11-01 ENCOUNTER — Ambulatory Visit: Payer: Medicare HMO | Admitting: Physical Therapy

## 2022-11-01 ENCOUNTER — Encounter: Payer: Self-pay | Admitting: Physical Therapy

## 2022-11-01 DIAGNOSIS — M6281 Muscle weakness (generalized): Secondary | ICD-10-CM

## 2022-11-01 DIAGNOSIS — M5441 Lumbago with sciatica, right side: Secondary | ICD-10-CM | POA: Diagnosis not present

## 2022-11-01 DIAGNOSIS — M533 Sacrococcygeal disorders, not elsewhere classified: Secondary | ICD-10-CM

## 2022-11-01 DIAGNOSIS — G8929 Other chronic pain: Secondary | ICD-10-CM

## 2022-11-01 DIAGNOSIS — R2681 Unsteadiness on feet: Secondary | ICD-10-CM

## 2022-11-01 NOTE — Patient Instructions (Signed)

## 2022-11-01 NOTE — Therapy (Signed)
OUTPATIENT PHYSICAL THERAPY THORACOLUMBAR EVALUATION   Patient Name: Patricia Parks MRN: [id number] DOB:1945-09-30, 77 y.o., female Today's Date: 11/01/2022  END OF SESSION:  PT End of Session - 11/01/22 1209     Visit Number 3    Number of Visits 16    Date for PT Re-Evaluation 12/18/22    Authorization Type [employer] MCR    Progress Note Due on Visit 10    PT Start Time 1150    PT Stop Time 1230    PT Time Calculation (min) 40 min    Activity Tolerance Patient limited by pain;Patient limited by fatigue    Behavior During Therapy [facility] for tasks assessed/performed               Past Medical History:  Diagnosis Date   Anxiety    Arthritis    Depression    Fatty liver    Fibromyalgia    Gout 04/2017   Hyperlipidemia    Hypertension    Hypothyroidism    pt no longer takes meds- just monitoring thyroid levels   Low kidney function    followed by Dr Nicholos Johns   Neuropathy    in both feet   Spinal stenosis    Past Surgical History:  Procedure Laterality Date   BACK SURGERY  09/13/2021   Carpel Tunnel Surgery Right 10/17/2018   both wrists   CATARACT EXTRACTION W/ INTRAOCULAR LENS IMPLANT Bilateral 10/25/2015   Right eye was first.  Left eye done 10/17.   CERVICAL BIOPSY  W/ LOOP ELECTRODE EXCISION  12/2008   hx CIN II   COLONOSCOPY     LAMINECTOMY WITH POSTERIOR LATERAL ARTHRODESIS LEVEL 1 Bilateral 08/10/2022   Procedure: Reexploration of Fusion Lumbar Five- Sacral One with Redo Postero-Lateral Fusion Replacement of Hardware and Posterio Lateral Arthodesis;  Surgeon: Donalee Citrin, MD;  Location: [facility] OR;  Service: Neurosurgery;  Laterality: Bilateral;   LUMBAR FUSION  2021   RADIOLOGY WITH ANESTHESIA N/A 07/26/2020   Procedure: MRI WITH ANESTHESIA  LUMBAR WITH AND WITHOUT CONTRAST;  Surgeon: Radiologist, Medication, MD;  Location: [facility] OR;  Service: Radiology;  Laterality: N/A;   Patient Active Problem List   Diagnosis Date Noted   Pseudoarthrosis of lumbar spine 08/10/2022    Mixed hyperlipidemia 10/20/2021   Palpitations 10/20/2021   Spinal stenosis of lumbar region 09/13/2021   Spondylolisthesis at L4-L5 level 01/13/2020   Pain in left knee 05/28/2017   Elevated lipids 12/30/2014   History of cervical dysplasia 12/30/2014   Essential hypertension 09/10/2013   Chest pain 08/28/2013    PCP: Darryll Capers, PA-C   REFERRING PROVIDER: Donalee Citrin, MD   REFERRING DIAG: S32.009K (ICD-10-CM) - Unspecified fracture of unspecified lumbar vertebra, subsequent encounter for fracture with nonunion  Lumbar Pseudoarthrosis  Rationale for Evaluation and Treatment: Rehabilitation  THERAPY DIAG:  Chronic bilateral low back pain with right-sided sciatica  Sacral back pain  Unsteadiness on feet  Muscle weakness (generalized)  ONSET DATE: 08-10-22 laminectomy  Bil PLIF L5 S1  SUBJECTIVE:  SUBJECTIVE STATEMENT: Pt reports 5/10 pain coming into clinic today.  Whenever I stop to rest it is challenging to get started again.  EVAL-I had back surgery again in June and pain is not getting better. I have had 3 back surgeries now.  I have trouble walking and in my bil legs but I have R pain down into the bottom of my whole R foot.. Left leg pain also down to my feet. A neurologist Dr Allena Katz told me I have neuropathy. I have had fibromyalgia for about 15 years. My right leg makes wake up with pain at night. I try to sleep with legs between my  legs and one against my back for supports.  I wake up every 2-3 hours at night.  I walk up and down the hall.. I try to do Upper body exercise 2 x a week. I was a member of [employer]. I am unable to drive due to not being able to feel my legs  R worse than L  PERTINENT HISTORY:  Spinal stenosis, neuropathy in bil feet, low kidney fx, Hypothyroidism HTN  gout , Fibromyalgia, depression, OA. Osteopenia, lumbar fusion 2021, Laminectomy Bi PLIF L5/s1 08-10-22, carpal tunnel R 10-17-2018. Exploratory back surgery 07/23  PAIN:  Are you having pain? Yes: NPRS scale: at rest after a pain pill 1/10  but when moving or decrease pain  6/10    at very worst it would be 8-9/10/10 Pain location: Back pain on left is 6/10 and at worst 8-9/10  and down R leg   Pain description: Left back pain is stabbing.  Constantly  and   R leg pain wakes up at night with stabbing pain but during the day it is tingling Aggravating factors: walking, sleeping at night, sometimes pacing at night helps  but any moving  sitting max 30 min depending on chair.   Standing 10-15 min.  Walking  5 minutes Relieving factors: pacing sometimes for a short time  PRECAUTIONS: None  RED FLAGS: Bowel or bladder incontinence: No   WEIGHT BEARING RESTRICTIONS: No  FALLS:  Has patient fallen in last 6 months? No but pt reports feeling unsteady and stumbles at times  LIVING ENVIRONMENT: Lives with: lives with their spouse Lives in: House/apartment Stairs: Yes: External: 3 steps; on right going up Has following equipment at home: Single point cane grab bars  a 2 wheeled walker  OCCUPATION: retired  Customer service manager  PLOF: Independent with household mobility with device  PATIENT GOALS:  TPDN and I would like to be stronger and not feel feeble  NEXT MD VISIT: TBD  OBJECTIVE:   DIAGNOSTIC FINDINGS:  CLINICAL DATA:  Lumbar region back pain radiating to the right leg. Previous lumbosacral surgeries.   EXAM: CT LUMBAR SPINE WITHOUT CONTRAST   TECHNIQUE: Multidetector CT imaging of the lumbar spine was performed without intravenous contrast administration. Multiplanar CT image reconstructions were also generated.   RADIATION DOSE REDUCTION: This exam was performed according to the departmental dose-optimization program which includes automated exposure control, adjustment of the  mA and/or kV according to patient size and/or use of iterative reconstruction technique.   COMPARISON:  Radiography 09/05/2022.  CT 05/18/2022.   FINDINGS: Segmentation: 5 lumbar type vertebral bodies as numbered previously.   Alignment: Chronic fixed scoliotic curvature convex to the right. 2 mm degenerative retrolisthesis at L1-2 is unchanged.   Vertebrae: No new vertebral finding. See below for details at each level.   Paraspinal and other soft tissues: No significant  finding.   Disc levels:   T11-12 and T12-L1: Normal   L1-2: Chronic degenerative retrolisthesis of 2 mm. Disc degeneration with bulging of the disc worse on the left. Facet degeneration and hypertrophy. Stenosis of the lateral recesses and foramina, left worse than right. Similar appearance to the study of March.   L2-3 and L3-4: Previous posterior decompression, diskectomy and fusion. Solid union with sufficient patency of the canal and foramina.   L4-5: Previous posterior decompression, diskectomy and fusion. Solid union with sufficient patency of the canal and foramina.   L5-S1: Previous posterior decompression, diskectomy and fusion procedure. Continued evidence of nonunion with nitrogen gas in the disc space and lucency around the S1 screws, left more than right. Central canal as well decompressed. There is foraminal narrowing on the right as seen previously that could possibly affect the L5 nerve.   IMPRESSION: 1. No change since the study of March of this year. Previous posterior decompression, diskectomy and fusion procedures from L2-3 through L4-5 with solid union and sufficient patency of the canal and foramina. 2. Continued evidence of nonunion at the L5-S1 level with nitrogen gas in the disc space and lucency around the S1 screws, left more than right. No change in foraminal narrowing on the right at L5-S1. 3. L1-2 degenerative disc disease with 2 mm degenerative retrolisthesis. Stenosis of  the lateral recesses and foramina, left worse than right. Similar appearance to the study of March.     Electronically Signed   By: Paulina Fusi M.D.   On: 10/07/2022 13:55  PATIENT SURVEYS:  FOTE  36%  predicted 43%  SCREENING FOR RED FLAGS: Bowel or bladder incontinence: No  COGNITION: Overall cognitive status: Within functional limits for tasks assessed     SENSATION: Pt with bil neuropathy and avoids driving car due to not being able to feel the brake pedal  MUSCLE LENGTH: Hamstrings:  WFL   POSTURE: rounded shoulders, forward head, and left pelvic level higher  Left anterior innominate rotation  PALPATION: Pt with Left SI sensitivity and + sacral compression and distraction test.  Pt with decreased pain with R LAD for R radiculopathy.  Pt with pain with palpation over Right Lumbar l-3 to S1 TTP over Left SI jt LUMBAR ROM:   AROM eval  Flexion Fingertips to floor  Extension 20% L low back  Right lateral flexion Fingertips 2 inch below knee jt line pulling pain on left  Left lateral flexion Fingertips to knee jt line pain on L  Right rotation 50%  Left rotation 50%   (Blank rows = not tested)  LOWER EXTREMITY ROM:   All WNL unless other wise noted  Active  Right eval Left eval  Hip flexion Standing 55  Standing  60  Hip extension    Hip abduction    Hip adduction    Hip internal rotation 20   Hip external rotation    Knee flexion    Knee extension    Ankle dorsiflexion    Ankle plantarflexion    Ankle inversion    Ankle eversion     (Blank rows = not tested)  LOWER EXTREMITY MMT:    MMT Right eval Left eval  Hip flexion 3- 4-  Hip extension 3- 4-  Hip abduction 3- 4-  Hip adduction    Hip internal rotation    Hip external rotation    Knee flexion 4 4  Knee extension 4 4  Ankle dorsiflexion    Ankle plantarflexion  Ankle inversion    Ankle eversion     (Blank rows = not tested)  LUMBAR SPECIAL TESTS:  NT due to lumbar surgery in June  2024  FUNCTIONAL TESTS:  5 times sit to stand: 30.06 sec   5ft with SPC GAIT: Distance walked: 150 feet  then 99 ft for Assistive device utilized: Single point cane Level of assistance: Modified independence Comments: Pt walks cautiously and very slowly and unsteady , not confident with gait  TODAY'S TREATMENT:   [facility] Adult PT Treatment:                                                DATE: 11-01-22 Therapeutic Exercise: STS 1 x 10 STS 2 x 10 with 6 lb weight  Step ups with 6 inch with CGA x 1  1 x 10 R and L Supine Pelvic Tilt   3 sets - 10 reps Supine SKTC  1 sets - 5 reps - 10 hold Supine Lower Trunk Rotation   5 x 20 sec R and L Manual Therapy: STW of  left QL and over lumbar and sacral paraspinals Trigger Point Dry-Needling performed     by Garen Lah Treatment instructions: Expect mild to moderate muscle soreness. S/S of pneumothorax if dry needled over a lung field, and to seek immediate medical attention should they occur. Patient verbalized understanding of these instructions and education.  Patient Consent Given: Yes Education handout provided: Previously provided Muscles treated:  L  QL and  L lumbar/sacral paraspinals L5/s1 Electrical stimulation performed: No Parameters: N/A Treatment response/outcome: twitch response noted, pt noted relief  [facility] Adult PT Treatment:                                                DATE: 10-24-22 Pt enters building ambulating independently. Treatment took place in water 3.8 to  4 ft 8 in.feet deep depending upon activity.  Pt entered and exited the pool via stair and handrails independently. Pt initiated Rx with 7/10 pain. At end of session 4/10  Acclimating to water by walking back and forth and side stepping using rainbow DB in bil UE for security and balance in water Ms.  Dunfee was educated on  beneficial therapeutic effects of water while ambulating to acclimate to water walking forward, backward and side stepping.   Pt educated on neutral posture and hip hinging in seated position with water at chest level x 10 with stretch to low back and then x 10 with back at pool wall at external cue, VC for neck tucked to prevent hyperextension.  Also reinforced with STS on submerge bench and with submerged step with 90/90 hip for greater depth of squat. Aquatic Exercise:   Pt hip hinge using pool noodle and then gentle lumbar rotataion Thoracic AROM at edge of pool Hip abduction/adduction x10 BIL Hip ext/flex with knee straight R x 20   Standing back stretch with  UE on edge of pool  KB up and down press   Squats x 20 Heel and toe raises Toe yoga in pool Ambulating forward, backward and side stepping with CGA x 1 at beginning of session and then I with rainbow DB at end of session for  5 rounds of 30 feet. each      Pt requires the buoyancy of water for active assisted exercises with buoyancy supported for strengthening and AROM exercises. Hydrostatic pressure also supports joints by unweighting joint load by at least 50 % in 3-4 feet depth water. 80% in chest to neck deep water. Water will provide assistance with movement using the current and laminar flow while the buoyancy reduces weight bearing. Pt requires the viscosity of the water for resistance endurance  with strengthening exercises.                                                                                                                              DATE: EVAL 10-23-22  issue HEP    PATIENT EDUCATION:  Education details: POC Explanation of findings, FOTO, issue HEP Person educated: Patient Education method: Explanation, Demonstration, Tactile cues, Verbal cues, and Handouts Education comprehension: verbalized understanding, returned demonstration, verbal cues required, tactile cues required, and needs further education  HOME EXERCISE PROGRAM: Access Code: [email or web address] URL: [email or web address] Date: 10/23/2022 Prepared by:  Garen Lah  Exercises - Supine Pelvic Tilt  - 1 x daily - 7 x weekly - 3 sets - 10 reps - Supine Single Knee to Chest Stretch  - 2 x daily - 7 x weekly - 1 sets - 5 reps - 10 hold - Supine Lower Trunk Rotation  - 2 x daily - 7 x weekly - 1 sets - 5 reps - 20 hold - Sit to stand with sink support Movement snack  - 1 x daily - 7 x weekly - 3 sets - 10 reps Added 11-01-22 - Supine Bridge with Mini Swiss Ball Between Knees  - 1 x daily - 7 x weekly - 3 sets - 10 reps ASSESSMENT:  CLINICAL IMPRESSION:  Pt enters clinic with 5/10  and decreased to 3/10 at end of session.after TPDN. Pt consents to TPDN and is closely monitored throughout session.  Pt enters clinic very tentative with gait carrying a SPC and decreased stride length and decreased bil hip flexion.  Pt has neuropathy and is fearful of falling.  Session also concentrated on STS and step ups for strengthening today as well as reinforcing HEP with update and addition of bridge with ball exercise.  Will continue toward completion of goals and maximizing safe and functional independence.  EVAL- Patient is a 77  y.o. female who was seen today for physical therapy evaluation and treatment for continuing pain post surgeries (2) and most recent . Laminectomy Bil PLIF L5/S1 08-10-22, Pt has difficulty walking and remains sedentary due to pain. She has hx of fibromyalgia and bil neuropathy with complaints of pain in bil bottom of feet.  She reports she does UE exercises but she does not feel safe standing and walking. Ms Schroll sleep is disturbed and she cannot sleep for longer than 2 hours at night.  Pt arrives at PT to try to get stronger and to  improve her ability to move.  She seeks TPDN, HEP and aquatics to help her achieve her goals.  Pt also has some Positive signs of Left SI jt irritation. Pt will benefit from skilled PT to address her impairment and her immobility due to pain and weakness to return her to a more active lifestyle and continuance  of healthy habits.  OBJECTIVE IMPAIRMENTS: decreased activity tolerance, decreased balance, decreased mobility, difficulty walking, decreased ROM, decreased strength, postural dysfunction, obesity, and pain.   ACTIVITY LIMITATIONS: bending, standing, squatting, sleeping, stairs, transfers, bed mobility, and locomotion level  PARTICIPATION LIMITATIONS: meal prep, cleaning, laundry, shopping, and community activity  PERSONAL FACTORS: Spinal stenosis, neuropathy in bil feet, low kidney fx, Hypothyroidism HTN gout , Fibromyalgia, depression, OA. Osteopenia, lumbar fusion 2021, Laminectomy Bi PLIF L5/s1 08-10-22, carpal tunnel R 10-17-2018. Exploratory back surgery 07/23 are also affecting patient's functional outcome.   REHAB POTENTIAL:Good but challenging due to bil neuropathy and multiple back surgeries  CLINICAL DECISION MAKING: Evolving/moderate complexity  EVALUATION COMPLEXITY: Moderate   GOALS: Goals reviewed with patient? Yes  SHORT TERM GOALS: Target date: 11-20-22  Pt will be I with initial HEP Baseline: Goal status: INITIAL  2.  Pt pain will be decreased  by 25% Baseline:  Goal status: INITIAL  3.  Pt will be able to sleep at night for 3 or more hours of consecutive sleep without awakening due to pain Baseline: wake every 2-3 hours Goal status: INITIAL  4.  Pt will be able to perform 6 MWT without fatigueing Baseline:  fatigued after 2 MWT  99 ft Goal status: INITIAL  5.  Pt will be able to be educated in aquatic therapy for exercise in environment with decreased joint loading.  Baseline: No knowledge about aquatics Goal status: INITIAL   LONG TERM GOALS: Target date: 12-18-22  Pt will be able to be I with advanced HEP Baseline:  Goal status: INITIAL  2.  Pt pain will decrease for functional activities  and household chores by 50% Baseline: unable to stand for 5 min and cannot complete chores Goal status: INITIAL  3.  Pt will be able to perform 5 x STS with 15  sec or less to show increased LE strength Baseline: 30.06 sec Goal status: INITIAL  4.  will be able to lift 15# from the floor, not limited by pain, to allow completion of housework  Baseline: unable to lift items from floor Goal status: INITIAL  5.  Pt will report 4 or more hours of uninterrupted sleep due to pain Baseline: Awakes every 2-3 hours due to pain at night Goal status: INITIAL  6.  FOTO will improve from  36%  to 43%    indicating improved functional mobility.  Baseline: EVAL 36% Goal status: INITIAL  PLAN:  PT FREQUENCY: 1-2x/week  PT DURATION: 8 weeks  PLANNED INTERVENTIONS: Therapeutic exercises, Therapeutic activity, Neuromuscular re-education, Balance training, Gait training, Patient/Family education, Self Care, Joint mobilization, Stair training, DME instructions, Aquatic Therapy, Dry Needling, Electrical stimulation, Spinal mobilization, Cryotherapy, Moist heat, Taping, Manual therapy, and Re-evaluation.  PLAN FOR NEXT SESSION: TPDN. Aquatics  HEP  Left SI RX  Garen Lah, PT, ATR[facility] Certified Exercise Expert for the Aging Adult  11/01/22 12:39 PM Phone: [phone] Fax: [phone]

## 2022-11-06 NOTE — Therapy (Signed)
OUTPATIENT PHYSICAL THERAPY THORACOLUMBAR EVALUATION   Patient Name: Patricia Parks MRN: [id number] DOB:Aug 01, 1945, 77 y.o., female Today's Date: 11/07/2022  END OF SESSION:  PT End of Session - 11/07/22 1234     Visit Number 4    Number of Visits 16    Date for PT Re-Evaluation 12/18/22    Authorization Type [employer] MCR    Progress Note Due on Visit 10    PT Start Time 1230    PT Stop Time 1316    PT Time Calculation (min) 46 min    Activity Tolerance Patient limited by pain;Patient limited by fatigue    Behavior During Therapy [facility] for tasks assessed/performed                Past Medical History:  Diagnosis Date   Anxiety    Arthritis    Depression    Fatty liver    Fibromyalgia    Gout 04/2017   Hyperlipidemia    Hypertension    Hypothyroidism    pt no longer takes meds- just monitoring thyroid levels   Low kidney function    followed by Dr Nicholos Johns   Neuropathy    in both feet   Spinal stenosis    Past Surgical History:  Procedure Laterality Date   BACK SURGERY  09/13/2021   Carpel Tunnel Surgery Right 10/17/2018   both wrists   CATARACT EXTRACTION W/ INTRAOCULAR LENS IMPLANT Bilateral 10/25/2015   Right eye was first.  Left eye done 10/17.   CERVICAL BIOPSY  W/ LOOP ELECTRODE EXCISION  12/2008   hx CIN II   COLONOSCOPY     LAMINECTOMY WITH POSTERIOR LATERAL ARTHRODESIS LEVEL 1 Bilateral 08/10/2022   Procedure: Reexploration of Fusion Lumbar Five- Sacral One with Redo Postero-Lateral Fusion Replacement of Hardware and Posterio Lateral Arthodesis;  Surgeon: Donalee Citrin, MD;  Location: [facility] OR;  Service: Neurosurgery;  Laterality: Bilateral;   LUMBAR FUSION  2021   RADIOLOGY WITH ANESTHESIA N/A 07/26/2020   Procedure: MRI WITH ANESTHESIA  LUMBAR WITH AND WITHOUT CONTRAST;  Surgeon: Radiologist, Medication, MD;  Location: [facility] OR;  Service: Radiology;  Laterality: N/A;   Patient Active Problem List   Diagnosis Date Noted   Pseudoarthrosis of lumbar spine  08/10/2022   Mixed hyperlipidemia 10/20/2021   Palpitations 10/20/2021   Spinal stenosis of lumbar region 09/13/2021   Spondylolisthesis at L4-L5 level 01/13/2020   Pain in left knee 05/28/2017   Elevated lipids 12/30/2014   History of cervical dysplasia 12/30/2014   Essential hypertension 09/10/2013   Chest pain 08/28/2013    PCP: Darryll Capers, PA-C   REFERRING PROVIDER: Donalee Citrin, MD   REFERRING DIAG: S32.009K (ICD-10-CM) - Unspecified fracture of unspecified lumbar vertebra, subsequent encounter for fracture with nonunion  Lumbar Pseudoarthrosis  Rationale for Evaluation and Treatment: Rehabilitation  THERAPY DIAG:  Chronic bilateral low back pain with right-sided sciatica  Sacral back pain  Unsteadiness on feet  Muscle weakness (generalized)  ONSET DATE: 08-10-22 laminectomy  Bil PLIF L5 S1  SUBJECTIVE:  SUBJECTIVE STATEMENT: Pt reports 6/10 pain coming into clinic today.  I am feeling more pain/neuropathy on my left back down my leg into the bottom of foot today.   It has usually been on my Right instead. I am trying to do exercises  and it seems to help but I am not sure if it does.At end of session 3/10  EVAL-I had back surgery again in June and pain is not getting better. I have had 3 back surgeries now.  I have trouble walking and in my bil legs but I have R pain down into the bottom of my whole R foot.. Left leg pain also down to my feet. A neurologist Dr Allena Katz told me I have neuropathy. I have had fibromyalgia for about 15 years. My right leg makes wake up with pain at night. I try to sleep with legs between my  legs and one against my back for supports.  I wake up every 2-3 hours at night.  I walk up and down the hall.. I try to do Upper body exercise 2 x a week. I was a member of [employer]. I am unable to drive due to not being able to feel my legs  R worse than L  PERTINENT HISTORY:  Spinal stenosis, neuropathy in bil feet, low kidney fx, Hypothyroidism HTN gout , Fibromyalgia, depression, OA. Osteopenia, lumbar fusion 2021, Laminectomy Bi PLIF L5/s1 08-10-22, carpal tunnel R 10-17-2018. Exploratory back surgery 07/23  PAIN:  Are you having pain? Yes: NPRS scale: at rest after a pain pill 1/10  but when moving or decrease pain  6/10    at very worst it would be 8-9/10/10 Pain location: Back pain on left is 6/10 and at worst 8-9/10  and down R leg   Pain description: Left back pain is stabbing.  Constantly  and   R leg pain wakes up at night with stabbing pain but during the day it is tingling Aggravating factors: walking, sleeping at night, sometimes pacing at night helps  but any moving  sitting max 30 min depending on chair.   Standing 10-15 min.  Walking  5 minutes Relieving factors: pacing sometimes for a short time  PRECAUTIONS: None  RED FLAGS: Bowel or bladder incontinence: No   WEIGHT BEARING RESTRICTIONS: No  FALLS:  Has patient fallen in last 6 months? No but pt reports feeling unsteady and stumbles at times  LIVING ENVIRONMENT: Lives with: lives with their spouse Lives in: House/apartment Stairs: Yes: External: 3 steps; on right going up Has following equipment at home: Single point cane grab bars  a 2 wheeled walker  OCCUPATION: retired  Customer service manager  PLOF: Independent with household mobility with device  PATIENT GOALS:  TPDN and I would like to be stronger and not feel feeble  NEXT MD VISIT: TBD  OBJECTIVE:   DIAGNOSTIC FINDINGS:  CLINICAL DATA:  Lumbar region back pain radiating to the right leg. Previous lumbosacral surgeries.   EXAM: CT LUMBAR SPINE WITHOUT CONTRAST   TECHNIQUE: Multidetector CT imaging of the lumbar spine was performed without intravenous contrast administration. Multiplanar CT image reconstructions were also  generated.   RADIATION DOSE REDUCTION: This exam was performed according to the departmental dose-optimization program which includes automated exposure control, adjustment of the mA and/or kV according to patient size and/or use of iterative reconstruction technique.   COMPARISON:  Radiography 09/05/2022.  CT 05/18/2022.   FINDINGS: Segmentation: 5 lumbar type vertebral bodies as numbered previously.  Alignment: Chronic fixed scoliotic curvature convex to the right. 2 mm degenerative retrolisthesis at L1-2 is unchanged.   Vertebrae: No new vertebral finding. See below for details at each level.   Paraspinal and other soft tissues: No significant finding.   Disc levels:   T11-12 and T12-L1: Normal   L1-2: Chronic degenerative retrolisthesis of 2 mm. Disc degeneration with bulging of the disc worse on the left. Facet degeneration and hypertrophy. Stenosis of the lateral recesses and foramina, left worse than right. Similar appearance to the study of March.   L2-3 and L3-4: Previous posterior decompression, diskectomy and fusion. Solid union with sufficient patency of the canal and foramina.   L4-5: Previous posterior decompression, diskectomy and fusion. Solid union with sufficient patency of the canal and foramina.   L5-S1: Previous posterior decompression, diskectomy and fusion procedure. Continued evidence of nonunion with nitrogen gas in the disc space and lucency around the S1 screws, left more than right. Central canal as well decompressed. There is foraminal narrowing on the right as seen previously that could possibly affect the L5 nerve.   IMPRESSION: 1. No change since the study of March of this year. Previous posterior decompression, diskectomy and fusion procedures from L2-3 through L4-5 with solid union and sufficient patency of the canal and foramina. 2. 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 No change in foraminal narrowing on the right at L5-S1. 3. L1-2 degenerative disc disease with 2 mm degenerative retrolisthesis. Stenosis of the lateral recesses and foramina, left worse than right. Similar appearance to the study of March.     Electronically Signed   By: Paulina Fusi M.D.   On: 10/07/2022 13:55  PATIENT SURVEYS:  FOTE  36%  predicted 43%  SCREENING FOR RED FLAGS: Bowel or bladder incontinence: No  COGNITION: Overall cognitive status: Within functional limits for tasks assessed     SENSATION: Pt with bil neuropathy and avoids driving car due to not being able to feel the brake pedal  MUSCLE LENGTH: Hamstrings:  WFL   POSTURE: rounded shoulders, forward head, and left pelvic level higher  Left anterior innominate rotation  PALPATION: Pt with Left SI sensitivity and + sacral compression and distraction test.  Pt with decreased pain with R LAD for R radiculopathy.  Pt with pain with palpation over Right Lumbar l-3 to S1 TTP over Left SI jt LUMBAR ROM:   AROM eval  Flexion Fingertips to floor  Extension 20% L low back  Right lateral flexion Fingertips 2 inch below knee jt line pulling pain on left  Left lateral flexion Fingertips to knee jt line pain on L  Right rotation 50%  Left rotation 50%   (Blank rows = not tested)  LOWER EXTREMITY ROM:   All WNL unless other wise noted  Active  Right eval Left eval  Hip flexion Standing 55  Standing  60  Hip extension    Hip abduction    Hip adduction    Hip internal rotation 20   Hip external rotation    Knee flexion    Knee extension    Ankle dorsiflexion    Ankle plantarflexion    Ankle inversion    Ankle eversion     (Blank rows = not tested)  LOWER EXTREMITY MMT:    MMT Right eval Left eval  Hip flexion 3- 4-  Hip extension 3- 4-  Hip abduction  3- 4-  Hip adduction    Hip internal rotation    Hip external rotation    Knee flexion 4 4  Knee  extension 4 4  Ankle dorsiflexion    Ankle plantarflexion    Ankle inversion    Ankle eversion     (Blank rows = not tested)  LUMBAR SPECIAL TESTS:  NT due to lumbar surgery in June 2024  FUNCTIONAL TESTS:  5 times sit to stand: 30.06 sec   5ft with SPC GAIT: Distance walked: 150 feet  then 99 ft for Assistive device utilized: Single point cane Level of assistance: Modified independence Comments: Pt walks cautiously and very slowly and unsteady , not confident with gait           TODAY'S TREATMENT:  [facility] Adult PT Treatment:                                                DATE: 11-07-22 Pt enters building ambulating independently using a SPC to ambulate. Treatment took place in water 3.8 to  4 ft 8 in.feet deep depending upon activity.  Pt entered and exited the pool via stair and handrails independently. Pt initiated Rx with 6/10 pain with increased new pain down my left side and it just started last night. At end of session 4/10 Acclimating to water by walking back and forth and side stepping using rainbow DB in bil UE for security and balance in water Aquatic Exercise:   Pt hip hinge using pool noodle  Gentle lumbar rotation with pool noodle yellow Thoracic AROM at edge of pool Kickboard side to side resistance Hip abduction/adduction x10 BIL Hip ext/flex with knee straight R x 10 Hip circles with CCW and CW   Water step ups on submerged step 10 x R and L  SL step in water without aquatic flotation to work on balance with turbulence in water  SL stand and kicking for balance without UE support Squats x 20    Bad Ragaz, Pt with lumbar belt around hips and nek doodle for neck support.  PT at torso and assisting with trunk left to right and vice versa to engage trunk muscles. PT then rotated trunk in order to engage abdominal (internal and external obliques) Emphasis on breathing techniques to draw in abdominals for support.  Pt then utilizing posterior chain and  engaging Hip extension and knee flexion with water resistance . LAD over BIL LE.     Pt requires the buoyancy of water for active assisted exercises with buoyancy supported for strengthening and AROM exercises. Hydrostatic pressure also supports joints by unweighting joint load by at least 50 % in 3-4 feet depth water. 80% in chest to neck deep water. Water will provide assistance with movement using the current and laminar flow while the buoyancy reduces weight bearing. Pt requires the viscosity of the water for resistance endurance  with strengthening exercises.         [facility] Adult PT Treatment:                                                DATE: 11-01-22 Therapeutic Exercise: STS 1 x 10 STS 2 x 10 with 6 lb weight  Step ups with 6 inch with CGA x 1  1 x 10 R and L Supine Pelvic Tilt   3 sets - 10 reps Supine SKTC  1 sets - 5 reps - 10 hold Supine Lower Trunk Rotation   5 x 20 sec R and L Manual Therapy: STW of  left QL and over lumbar and sacral paraspinals Trigger Point Dry-Needling performed     by Garen Lah Treatment instructions: Expect mild to moderate muscle soreness. S/S of pneumothorax if dry needled over a lung field, and to seek immediate medical attention should they occur. Patient verbalized understanding of these instructions and education.  Patient Consent Given: Yes Education handout provided: Previously provided Muscles treated:  L  QL and  L lumbar/sacral paraspinals L5/s1 Electrical stimulation performed: No Parameters: N/A Treatment response/outcome: twitch response noted, pt noted relief  [facility] Adult PT Treatment:                                                DATE: 10-24-22 Pt enters building ambulating independently. Treatment took place in water 3.8 to  4 ft 8 in.feet deep depending upon activity.  Pt entered and exited the pool via stair and handrails independently. Pt initiated Rx with 7/10 pain. At end of session 4/10  Acclimating to water by  walking back and forth and side stepping using rainbow DB in bil UE for security and balance in water Patricia Parks was educated on  beneficial therapeutic effects of water while ambulating to acclimate to water walking forward, backward and side stepping.  Pt educated on neutral posture and hip hinging in seated position with water at chest level x 10 with stretch to low back and then x 10 with back at pool wall at external cue, VC for neck tucked to prevent hyperextension.  Also reinforced with STS on submerge bench and with submerged step with 90/90 hip for greater depth of squat. Aquatic Exercise:   Pt hip hinge using pool noodle and then gentle lumbar rotataion Thoracic AROM at edge of pool Hip abduction/adduction x10 BIL Hip ext/flex with knee straight R x 20   Standing back stretch with  UE on edge of pool  KB up and down press   Squats x 20 Heel and toe raises Toe yoga in pool Ambulating forward, backward and side stepping with CGA x 1 at beginning of session and then I with rainbow DB at end of session for 5 rounds of 30 feet. each      Pt requires the buoyancy of water for active assisted exercises with buoyancy supported for strengthening and AROM exercises. Hydrostatic pressure also supports joints by unweighting joint load by at least 50 % in 3-4 feet depth water. 80% in chest to neck deep water. Water will provide assistance with movement using the current and laminar flow while the buoyancy reduces weight bearing. Pt requires the viscosity of the water for resistance endurance  with strengthening exercises.  DATE: EVAL 10-23-22  issue HEP    PATIENT EDUCATION:  Education details: POC Explanation of findings, FOTO, issue HEP Person educated: Patient Education method: Explanation, Demonstration, Tactile cues, Verbal cues, and Handouts Education  comprehension: verbalized understanding, returned demonstration, verbal cues required, tactile cues required, and needs further education  HOME EXERCISE PROGRAM: Access Code: [email or web address] URL: [email or web address] Date: 10/23/2022 Prepared by: Garen Lah  Exercises - Supine Pelvic Tilt  - 1 x daily - 7 x weekly - 3 sets - 10 reps - Supine Single Knee to Chest Stretch  - 2 x daily - 7 x weekly - 1 sets - 5 reps - 10 hold - Supine Lower Trunk Rotation  - 2 x daily - 7 x weekly - 1 sets - 5 reps - 20 hold - Sit to stand with sink support Movement snack  - 1 x daily - 7 x weekly - 3 sets - 10 reps Added 11-01-22 - Supine Bridge with Mini Swiss Ball Between Knees  - 1 x daily - 7 x weekly - 3 sets - 10 reps ASSESSMENT:  CLINICAL IMPRESSION: Session today focused on hip core strength  and balance in the aquatic environment for use of buoyancy to offload joints and the viscosity of water as resistance during therapeutic exercise.  We were able to progress aquatic exercise today to include more dynamic core stabilization and balance today.  Patient was able to tolerate all prescribed exercises in the aquatic environment with no adverse effects and reports 3 /10 pain at the end of the session. Patient continues to benefit from skilled PT services on land and aquatic based and should be progressed as able to improve functional independence       EVAL- Patient is a 78  y.o. female who was seen today for physical therapy evaluation and treatment for continuing pain post surgeries (2) and most recent . Laminectomy Bil PLIF L5/S1 08-10-22, Pt has difficulty walking and remains sedentary due to pain. She has hx of fibromyalgia and bil neuropathy with complaints of pain in bil bottom of feet.  She reports she does UE exercises but she does not feel safe standing and walking. Patricia Parks sleep is disturbed and she cannot sleep for longer than 2 hours at night.  Pt arrives at PT to try to get  stronger and to improve her ability to move.  She seeks TPDN, HEP and aquatics to help her achieve her goals.  Pt also has some Positive signs of Left SI jt irritation. Pt will benefit from skilled PT to address her impairment and her immobility due to pain and weakness to return her to a more active lifestyle and continuance of healthy habits.  OBJECTIVE IMPAIRMENTS: decreased activity tolerance, decreased balance, decreased mobility, difficulty walking, decreased ROM, decreased strength, postural dysfunction, obesity, and pain.   ACTIVITY LIMITATIONS: bending, standing, squatting, sleeping, stairs, transfers, bed mobility, and locomotion level  PARTICIPATION LIMITATIONS: meal prep, cleaning, laundry, shopping, and community activity  PERSONAL FACTORS: Spinal stenosis, neuropathy in bil feet, low kidney fx, Hypothyroidism HTN gout , Fibromyalgia, depression, OA. Osteopenia, lumbar fusion 2021, Laminectomy Bi PLIF L5/s1 08-10-22, carpal tunnel R 10-17-2018. Exploratory back surgery 07/23 are also affecting patient's functional outcome.   REHAB POTENTIAL:Good but challenging due to bil neuropathy and multiple back surgeries  CLINICAL DECISION MAKING: Evolving/moderate complexity  EVALUATION COMPLEXITY: Moderate   GOALS: Goals reviewed with patient? Yes  SHORT TERM GOALS: Target date: 11-20-22  Pt will be I with initial HEP Baseline: Goal status:  INITIAL  2.  Pt pain will be decreased  by 25% Baseline:  Goal status: INITIAL  3.  Pt will be able to sleep at night for 3 or more hours of consecutive sleep without awakening due to pain Baseline: wake every 2-3 hours Goal status: INITIAL  4.  Pt will be able to perform 6 MWT without fatigueing Baseline:  fatigued after 2 MWT  99 ft Goal status: INITIAL  5.  Pt will be able to be educated in aquatic therapy for exercise in environment with decreased joint loading.  Baseline: No knowledge about aquatics Goal status: INITIAL   LONG TERM  GOALS: Target date: 12-18-22  Pt will be able to be I with advanced HEP Baseline:  Goal status: INITIAL  2.  Pt pain will decrease for functional activities  and household chores by 50% Baseline: unable to stand for 5 min and cannot complete chores Goal status: INITIAL  3.  Pt will be able to perform 5 x STS with 15 sec or less to show increased LE strength Baseline: 30.06 sec Goal status: INITIAL  4.  will be able to lift 15# from the floor, not limited by pain, to allow completion of housework  Baseline: unable to lift items from floor Goal status: INITIAL  5.  Pt will report 4 or more hours of uninterrupted sleep due to pain Baseline: Awakes every 2-3 hours due to pain at night Goal status: INITIAL  6.  FOTO will improve from  36%  to 43%    indicating improved functional mobility.  Baseline: EVAL 36% Goal status: INITIAL  PLAN:  PT FREQUENCY: 1-2x/week  PT DURATION: 8 weeks  PLANNED INTERVENTIONS: Therapeutic exercises, Therapeutic activity, Neuromuscular re-education, Balance training, Gait training, Patient/Family education, Self Care, Joint mobilization, Stair training, DME instructions, Aquatic Therapy, Dry Needling, Electrical stimulation, Spinal mobilization, Cryotherapy, Moist heat, Taping, Manual therapy, and Re-evaluation.  PLAN FOR NEXT SESSION: TPDN. Aquatics  HEP  Left SI RX  Garen Lah, PT, ATRIC Certified Exercise Expert for the Aging Adult  11/07/22 1:23 PM Phone: [phone] Fax: [phone]

## 2022-11-07 ENCOUNTER — Ambulatory Visit: Payer: Medicare HMO | Attending: Neurosurgery | Admitting: Physical Therapy

## 2022-11-07 ENCOUNTER — Encounter: Payer: Self-pay | Admitting: Physical Therapy

## 2022-11-07 DIAGNOSIS — M5441 Lumbago with sciatica, right side: Secondary | ICD-10-CM | POA: Diagnosis present

## 2022-11-07 DIAGNOSIS — M533 Sacrococcygeal disorders, not elsewhere classified: Secondary | ICD-10-CM | POA: Insufficient documentation

## 2022-11-07 DIAGNOSIS — R2681 Unsteadiness on feet: Secondary | ICD-10-CM | POA: Insufficient documentation

## 2022-11-07 DIAGNOSIS — G8929 Other chronic pain: Secondary | ICD-10-CM | POA: Diagnosis present

## 2022-11-07 DIAGNOSIS — M6281 Muscle weakness (generalized): Secondary | ICD-10-CM | POA: Insufficient documentation

## 2022-11-12 ENCOUNTER — Ambulatory Visit: Payer: Medicare HMO | Admitting: Physical Therapy

## 2022-11-12 ENCOUNTER — Encounter: Payer: Self-pay | Admitting: Physical Therapy

## 2022-11-12 DIAGNOSIS — M533 Sacrococcygeal disorders, not elsewhere classified: Secondary | ICD-10-CM

## 2022-11-12 DIAGNOSIS — G8929 Other chronic pain: Secondary | ICD-10-CM

## 2022-11-12 DIAGNOSIS — M5441 Lumbago with sciatica, right side: Secondary | ICD-10-CM | POA: Diagnosis not present

## 2022-11-12 DIAGNOSIS — M6281 Muscle weakness (generalized): Secondary | ICD-10-CM

## 2022-11-12 DIAGNOSIS — R2681 Unsteadiness on feet: Secondary | ICD-10-CM

## 2022-11-12 NOTE — Therapy (Signed)
OUTPATIENT PHYSICAL THERAPY THORACOLUMBAR EVALUATION   Patient Name: Patricia Parks MRN: [id number] DOB:1946/01/07, 77 y.o., female Today's Date: 11/12/2022  END OF SESSION:  PT End of Session - 11/12/22 1103     Visit Number 5    Number of Visits 16    Date for PT Re-Evaluation 12/18/22    Authorization Type [employer] MCR    Progress Note Due on Visit 10    PT Start Time 1104    PT Stop Time 1145    PT Time Calculation (min) 41 min    Activity Tolerance Patient limited by pain;Patient limited by fatigue    Behavior During Therapy [facility] for tasks assessed/performed                 Past Medical History:  Diagnosis Date   Anxiety    Arthritis    Depression    Fatty liver    Fibromyalgia    Gout 04/2017   Hyperlipidemia    Hypertension    Hypothyroidism    pt no longer takes meds- just monitoring thyroid levels   Low kidney function    followed by Dr Nicholos Johns   Neuropathy    in both feet   Spinal stenosis    Past Surgical History:  Procedure Laterality Date   BACK SURGERY  09/13/2021   Carpel Tunnel Surgery Right 10/17/2018   both wrists   CATARACT EXTRACTION W/ INTRAOCULAR LENS IMPLANT Bilateral 10/25/2015   Right eye was first.  Left eye done 10/17.   CERVICAL BIOPSY  W/ LOOP ELECTRODE EXCISION  12/2008   hx CIN II   COLONOSCOPY     LAMINECTOMY WITH POSTERIOR LATERAL ARTHRODESIS LEVEL 1 Bilateral 08/10/2022   Procedure: Reexploration of Fusion Lumbar Five- Sacral One with Redo Postero-Lateral Fusion Replacement of Hardware and Posterio Lateral Arthodesis;  Surgeon: Donalee Citrin, MD;  Location: [facility] OR;  Service: Neurosurgery;  Laterality: Bilateral;   LUMBAR FUSION  2021   RADIOLOGY WITH ANESTHESIA N/A 07/26/2020   Procedure: MRI WITH ANESTHESIA  LUMBAR WITH AND WITHOUT CONTRAST;  Surgeon: Radiologist, Medication, MD;  Location: [facility] OR;  Service: Radiology;  Laterality: N/A;   Patient Active Problem List   Diagnosis Date Noted   Pseudoarthrosis of lumbar spine  08/10/2022   Mixed hyperlipidemia 10/20/2021   Palpitations 10/20/2021   Spinal stenosis of lumbar region 09/13/2021   Spondylolisthesis at L4-L5 level 01/13/2020   Pain in left knee 05/28/2017   Elevated lipids 12/30/2014   History of cervical dysplasia 12/30/2014   Essential hypertension 09/10/2013   Chest pain 08/28/2013    PCP: Darryll Capers, PA-C   REFERRING PROVIDER: Donalee Citrin, MD   REFERRING DIAG: S32.009K (ICD-10-CM) - Unspecified fracture of unspecified lumbar vertebra, subsequent encounter for fracture with nonunion  Lumbar Pseudoarthrosis  Rationale for Evaluation and Treatment: Rehabilitation  THERAPY DIAG:  No diagnosis found.  ONSET DATE: 08-10-22 laminectomy  Bil PLIF L5 S1  SUBJECTIVE:  SUBJECTIVE STATEMENT:  Ms Vogan enters clinic after taking medication about a 4/10 and walking very slowly with dependence on [facility]. Pt request TPDN for today. She states she had to pick up sticks in the back yard and tried to walk around the perimeter of the yard but she has had difficulty with moving since last Thursday.   EVAL-I had back surgery again in June and pain is not getting better. I have had 3 back surgeries now.  I have trouble walking and in my bil legs but I have R pain down into the bottom of my whole R foot.. Left leg pain also down to my feet. A neurologist Dr Allena Katz told me I have neuropathy. I have had fibromyalgia for about 15 years. My right leg makes wake up with pain at night. I try to sleep with legs between my  legs and one against my back for supports.  I wake up every 2-3 hours at night.  I walk up and down the hall.. I try to do Upper body exercise 2 x a week. I was a member of [employer]. I am unable to drive due to not being able to feel my legs  R worse than L  PERTINENT  HISTORY:  Spinal stenosis, neuropathy in bil feet, low kidney fx, Hypothyroidism HTN gout , Fibromyalgia, depression, OA. Osteopenia, lumbar fusion 2021, Laminectomy Bi PLIF L5/s1 08-10-22, carpal tunnel R 10-17-2018. Exploratory back surgery 07/23  PAIN:  Are you having pain? Yes: NPRS scale: at rest after a pain pill 1/10  but when moving or decrease pain  6/10    at very worst it would be 8-9/10/10 Pain location: Back pain on left is 6/10 and at worst 8-9/10  and down R leg   Pain description: Left back pain is stabbing.  Constantly  and   R leg pain wakes up at night with stabbing pain but during the day it is tingling Aggravating factors: walking, sleeping at night, sometimes pacing at night helps  but any moving  sitting max 30 min depending on chair.   Standing 10-15 min.  Walking  5 minutes Relieving factors: pacing sometimes for a short time  PRECAUTIONS: None  RED FLAGS: Bowel or bladder incontinence: No   WEIGHT BEARING RESTRICTIONS: No  FALLS:  Has patient fallen in last 6 months? No but pt reports feeling unsteady and stumbles at times  LIVING ENVIRONMENT: Lives with: lives with their spouse Lives in: House/apartment Stairs: Yes: External: 3 steps; on right going up Has following equipment at home: Single point cane grab bars  a 2 wheeled walker  OCCUPATION: retired  Customer service manager  PLOF: Independent with household mobility with device  PATIENT GOALS:  TPDN and I would like to be stronger and not feel feeble  NEXT MD VISIT: TBD  OBJECTIVE:   DIAGNOSTIC FINDINGS:  CLINICAL DATA:  Lumbar region back pain radiating to the right leg. Previous lumbosacral surgeries.   EXAM: CT LUMBAR SPINE WITHOUT CONTRAST   TECHNIQUE: Multidetector CT imaging of the lumbar spine was performed without intravenous contrast administration. Multiplanar CT image reconstructions were also generated.   RADIATION DOSE REDUCTION: This exam was performed according to the departmental  dose-optimization program which includes automated exposure control, adjustment of the mA and/or kV according to patient size and/or use of iterative reconstruction technique.   COMPARISON:  Radiography 09/05/2022.  CT 05/18/2022.   FINDINGS: Segmentation: 5 lumbar type vertebral bodies as numbered previously.   Alignment: Chronic fixed scoliotic  curvature convex to the right. 2 mm degenerative retrolisthesis at L1-2 is unchanged.   Vertebrae: No new vertebral finding. See below for details at each level.   Paraspinal and other soft tissues: No significant finding.   Disc levels:   T11-12 and T12-L1: Normal   L1-2: Chronic degenerative retrolisthesis of 2 mm. Disc degeneration with bulging of the disc worse on the left. Facet degeneration and hypertrophy. Stenosis of the lateral recesses and foramina, left worse than right. Similar appearance to the study of March.   L2-3 and L3-4: Previous posterior decompression, diskectomy and fusion. Solid union with sufficient patency of the canal and foramina.   L4-5: Previous posterior decompression, diskectomy and fusion. Solid union with sufficient patency of the canal and foramina.   L5-S1: Previous posterior decompression, diskectomy and fusion procedure. Continued evidence of nonunion with nitrogen gas in the disc space and lucency around the S1 screws, left more than right. Central canal as well decompressed. There is foraminal narrowing on the right as seen previously that could possibly affect the L5 nerve.   IMPRESSION: 1. No change since the study of March of this year. Previous posterior decompression, diskectomy and fusion procedures from L2-3 through L4-5 with solid union and sufficient patency of the canal and foramina. 2. Continued evidence of nonunion at the L5-S1 level with nitrogen gas in the disc space and lucency around the S1 screws, left more than right. No change in foraminal narrowing on the right at  L5-S1. 3. L1-2 degenerative disc disease with 2 mm degenerative retrolisthesis. Stenosis of the lateral recesses and foramina, left worse than right. Similar appearance to the study of March.     Electronically Signed   By: Paulina Fusi M.D.   On: 10/07/2022 13:55  PATIENT SURVEYS:  FOTE  36%  predicted 43%  SCREENING FOR RED FLAGS: Bowel or bladder incontinence: No  COGNITION: Overall cognitive status: Within functional limits for tasks assessed     SENSATION: Pt with bil neuropathy and avoids driving car due to not being able to feel the brake pedal  MUSCLE LENGTH: Hamstrings:  WFL   POSTURE: rounded shoulders, forward head, and left pelvic level higher  Left anterior innominate rotation  PALPATION: Pt with Left SI sensitivity and + sacral compression and distraction test.  Pt with decreased pain with R LAD for R radiculopathy.  Pt with pain with palpation over Right Lumbar l-3 to S1 TTP over Left SI jt LUMBAR ROM:   AROM eval  Flexion Fingertips to floor  Extension 20% L low back  Right lateral flexion Fingertips 2 inch below knee jt line pulling pain on left  Left lateral flexion Fingertips to knee jt line pain on L  Right rotation 50%  Left rotation 50%   (Blank rows = not tested)  LOWER EXTREMITY ROM:   All WNL unless other wise noted  Active  Right eval Left eval  Hip flexion Standing 55  Standing  60  Hip extension    Hip abduction    Hip adduction    Hip internal rotation 20   Hip external rotation    Knee flexion    Knee extension    Ankle dorsiflexion    Ankle plantarflexion    Ankle inversion    Ankle eversion     (Blank rows = not tested)  LOWER EXTREMITY MMT:    MMT Right eval Left eval  Hip flexion 3- 4-  Hip extension 3- 4-  Hip abduction 3- 4-  Hip  adduction    Hip internal rotation    Hip external rotation    Knee flexion 4 4  Knee extension 4 4  Ankle dorsiflexion    Ankle plantarflexion    Ankle inversion    Ankle  eversion     (Blank rows = not tested)  LUMBAR SPECIAL TESTS:  NT due to lumbar surgery in June 2024  FUNCTIONAL TESTS:  5 times sit to stand: 30.06 sec   21ft with SPC 11-12-22 526.8 ft (91290 ft - 1755 ft) GAIT: Distance walked: 150 feet  then 99 ft for Assistive device utilized: Single point cane Level of assistance: Modified independence Comments: Pt walks cautiously and very slowly and unsteady , not confident with gait           TODAY'S TREATMENT:  [facility] Adult PT Treatment:                                                DATE: 11-12-22 Therapeutic Exercise: Supine Pelvic Tilt   2 sets - 10 reps Supine SKTC  1 sets - 5 reps - 10 hold Supine Lower Trunk Rotation   5 x 20 sec R and L 526.8 ft (91290 ft - 1755 ft) Manual Therapy: STW of  left QL and over lumbar and sacral paraspinals Trigger Point Dry-Needling performed     by Garen Lah Treatment instructions: Expect mild to moderate muscle soreness. S/S of pneumothorax if dry needled over a lung field, and to seek immediate medical attention should they occur. Patient verbalized understanding of these instructions and education.  Patient Consent Given: Yes Education handout provided: Previously provided Muscles treated:  L  QL and  L lumbar/sacral paraspinals. L Gluteals Electrical stimulation performed: No Parameters: N/A Treatment response/outcome: twitch response noted, pt noted relief [facility] Adult PT Treatment:                                                DATE: 11-07-22 Pt enters building ambulating independently using a SPC to ambulate. Treatment took place in water 3.8 to  4 ft 8 in.feet deep depending upon activity.  Pt entered and exited the pool via stair and handrails independently. Pt initiated Rx with 6/10 pain with increased new pain down my left side and it just started last night. At end of session 4/10 Acclimating to water by walking back and forth and side stepping using rainbow DB  in bil UE for security and balance in water Aquatic Exercise:   Pt hip hinge using pool noodle  Gentle lumbar rotation with pool noodle yellow Thoracic AROM at edge of pool Kickboard side to side resistance Hip abduction/adduction x10 BIL Hip ext/flex with knee straight R x 10 Hip circles with CCW and CW   Water step ups on submerged step 10 x R and L  SL step in water without aquatic flotation to work on balance with turbulence in water  SL stand and kicking for balance without UE support Squats x 20    Bad Ragaz, Pt with lumbar belt around hips and nek doodle for neck support.  PT at torso and assisting with trunk left to right and vice versa to engage trunk muscles. PT then  rotated trunk in order to engage abdominal (internal and external obliques) Emphasis on breathing techniques to draw in abdominals for support.  Pt then utilizing posterior chain and engaging Hip extension and knee flexion with water resistance . LAD over BIL LE.     Pt requires the buoyancy of water for active assisted exercises with buoyancy supported for strengthening and AROM exercises. Hydrostatic pressure also supports joints by unweighting joint load by at least 50 % in 3-4 feet depth water. 80% in chest to neck deep water. Water will provide assistance with movement using the current and laminar flow while the buoyancy reduces weight bearing. Pt requires the viscosity of the water for resistance endurance  with strengthening exercises.         [facility] Adult PT Treatment:                                                DATE: 11-01-22 Therapeutic Exercise: STS 1 x 10 STS 2 x 10 with 6 lb weight  Step ups with 6 inch with CGA x 1  1 x 10 R and L Supine Pelvic Tilt   3 sets - 10 reps Supine SKTC  1 sets - 5 reps - 10 hold Supine Lower Trunk Rotation   5 x 20 sec R and L Manual Therapy: STW of  left QL and over lumbar and sacral paraspinals Trigger Point Dry-Needling performed     by Garen Lah Treatment instructions: Expect mild to moderate muscle soreness. S/S of pneumothorax if dry needled over a lung field, and to seek immediate medical attention should they occur. Patient verbalized understanding of these instructions and education.  Patient Consent Given: Yes Education handout provided: Previously provided Muscles treated:  L  QL and  L lumbar/sacral paraspinals L5/s1 Electrical stimulation performed: No Parameters: N/A Treatment response/outcome: twitch response noted, pt noted relief  [facility] Adult PT Treatment:                                                DATE: 10-24-22 Pt enters building ambulating independently. Treatment took place in water 3.8 to  4 ft 8 in.feet deep depending upon activity.  Pt entered and exited the pool via stair and handrails independently. Pt initiated Rx with 7/10 pain. At end of session 4/10  Acclimating to water by walking back and forth and side stepping using rainbow DB in bil UE for security and balance in water Ms.  Ihrig was educated on  beneficial therapeutic effects of water while ambulating to acclimate to water walking forward, backward and side stepping.  Pt educated on neutral posture and hip hinging in seated position with water at chest level x 10 with stretch to low back and then x 10 with back at pool wall at external cue, VC for neck tucked to prevent hyperextension.  Also reinforced with STS on submerge bench and with submerged step with 90/90 hip for greater depth of squat. Aquatic Exercise:   Pt hip hinge using pool noodle and then gentle lumbar rotataion Thoracic AROM at edge of pool Hip abduction/adduction x10 BIL Hip ext/flex with knee straight R x 20   Standing back stretch with  UE on edge of pool  KB  up and down press   Squats x 20 Heel and toe raises Toe yoga in pool Ambulating forward, backward and side stepping with CGA x 1 at beginning of session and then I with rainbow DB at end of session for 5 rounds  of 30 feet. each      Pt requires the buoyancy of water for active assisted exercises with buoyancy supported for strengthening and AROM exercises. Hydrostatic pressure also supports joints by unweighting joint load by at least 50 % in 3-4 feet depth water. 80% in chest to neck deep water. Water will provide assistance with movement using the current and laminar flow while the buoyancy reduces weight bearing. Pt requires the viscosity of the water for resistance endurance  with strengthening exercises.                                                                                                                              DATE: EVAL 10-23-22  issue HEP    PATIENT EDUCATION:  Education details: POC Explanation of findings, FOTO, issue HEP Person educated: Patient Education method: Explanation, Demonstration, Tactile cues, Verbal cues, and Handouts Education comprehension: verbalized understanding, returned demonstration, verbal cues required, tactile cues required, and needs further education  HOME EXERCISE PROGRAM: Access Code: [email or web address] URL: [email or web address] Date: 10/23/2022 Prepared by: Garen Lah  Exercises - Supine Pelvic Tilt  - 1 x daily - 7 x weekly - 3 sets - 10 reps - Supine Single Knee to Chest Stretch  - 2 x daily - 7 x weekly - 1 sets - 5 reps - 10 hold - Supine Lower Trunk Rotation  - 2 x daily - 7 x weekly - 1 sets - 5 reps - 20 hold - Sit to stand with sink support Movement snack  - 1 x daily - 7 x weekly - 3 sets - 10 reps Added 11-01-22 - Supine Bridge with Mini Swiss Ball Between Knees  - 1 x daily - 7 x weekly - 3 sets - 10 reps ASSESSMENT:  CLINICAL IMPRESSION: Session today focused on pain relief , endurance and essential initial HEP.  LTG # 1, 5 and 6 all achieved.  Pt was able to complete 6 MWT 526.8 ft (91290 ft - 1755 ft) and was able to complete 6 MWT today which was a great improvement from eval. Pt is still unsteady on feet  and we spoke of continuing aquatics and exercise at local community wellness center once she is more stable and she can gain adequate strength.        EVAL- Patient is a 77  y.o. female who was seen today for physical therapy evaluation and treatment for continuing pain post surgeries (2) and most recent . Laminectomy Bil PLIF L5/S1 08-10-22, Pt has difficulty walking and remains sedentary due to pain. She has hx of fibromyalgia and bil neuropathy with complaints of pain in bil bottom of feet.  She reports she does UE exercises  but she does not feel safe standing and walking. Ms Azoulay sleep is disturbed and she cannot sleep for longer than 2 hours at night.  Pt arrives at PT to try to get stronger and to improve her ability to move.  She seeks TPDN, HEP and aquatics to help her achieve her goals.  Pt also has some Positive signs of Left SI jt irritation. Pt will benefit from skilled PT to address her impairment and her immobility due to pain and weakness to return her to a more active lifestyle and continuance of healthy habits.  OBJECTIVE IMPAIRMENTS: decreased activity tolerance, decreased balance, decreased mobility, difficulty walking, decreased ROM, decreased strength, postural dysfunction, obesity, and pain.   ACTIVITY LIMITATIONS: bending, standing, squatting, sleeping, stairs, transfers, bed mobility, and locomotion level  PARTICIPATION LIMITATIONS: meal prep, cleaning, laundry, shopping, and community activity  PERSONAL FACTORS: Spinal stenosis, neuropathy in bil feet, low kidney fx, Hypothyroidism HTN gout , Fibromyalgia, depression, OA. Osteopenia, lumbar fusion 2021, Laminectomy Bi PLIF L5/s1 08-10-22, carpal tunnel R 10-17-2018. Exploratory back surgery 07/23 are also affecting patient's functional outcome.   REHAB POTENTIAL:Good but challenging due to bil neuropathy and multiple back surgeries  CLINICAL DECISION MAKING: Evolving/moderate complexity  EVALUATION COMPLEXITY:  Moderate   GOALS: Goals reviewed with patient? Yes  SHORT TERM GOALS: Target date: 11-20-22  Pt will be I with initial HEP Baseline:no knowledge Goal status:MET  2.  Pt pain will be decreased  by 25% Baseline: 9/10 at eval at worst. 11-12-22  at worst now still a 9/10 Goal status: ONGOING  3.  Pt will be able to sleep at night for 3 or more hours of consecutive sleep without awakening due to pain Baseline: wake every 2-3 hours 11-12-22 Pt had bad weekend and waking every 2 hours and went back to sleep for 3 hours Goal status: ONGOING  4.  Pt will be able to perform 6 MWT without fatigueing Baseline:  fatigued after 2 MWT  99 ft 11-12-22 526.8 ft (91290 ft - 1755 ft) Goal status:MET  5.  Pt will be able to be educated in aquatic therapy for exercise in environment with decreased joint loading.  Baseline: No knowledge about aquatics 11-12-22  has been to aquatics x 2 Goal status:MET   LONG TERM GOALS: Target date: 12-18-22  Pt will be able to be I with advanced HEP Baseline:  Goal status: INITIAL  2.  Pt pain will decrease for functional activities  and household chores by 50% Baseline: unable to stand for 5 min and cannot complete chores Goal status: INITIAL  3.  Pt will be able to perform 5 x STS with 15 sec or less to show increased LE strength Baseline: 30.06 sec Goal status: INITIAL  4.  will be able to lift 15# from the floor, not limited by pain, to allow completion of housework  Baseline: unable to lift items from floor Goal status: INITIAL  5.  Pt will report 4 or more hours of uninterrupted sleep due to pain Baseline: Awakes every 2-3 hours due to pain at night Goal status: INITIAL  6.  FOTO will improve from  36%  to 43%    indicating improved functional mobility.  Baseline: EVAL 36% Goal status: INITIAL  PLAN:  PT FREQUENCY: 1-2x/week  PT DURATION: 8 weeks  PLANNED INTERVENTIONS: Therapeutic exercises, Therapeutic activity, Neuromuscular  re-education, Balance training, Gait training, Patient/Family education, Self Care, Joint mobilization, Stair training, DME instructions, Aquatic Therapy, Dry Needling, Electrical stimulation, Spinal mobilization, Cryotherapy,  Moist heat, Taping, Manual therapy, and Re-evaluation.  PLAN FOR NEXT SESSION: TPDN. Aquatics  HEP  Left SI RX  Garen Lah, PT, ATRIC Certified Exercise Expert for the Aging Adult  11/12/22 1:26 PM Phone: [phone] Fax: [phone]

## 2022-11-13 NOTE — Therapy (Signed)
OUTPATIENT PHYSICAL THERAPY THORACOLUMBAR EVALUATION   Patient Name: Patricia Parks MRN: [id number] DOB:1945-04-19, 77 y.o., female Today's Date: 11/20/2022  END OF SESSION:  PT End of Session - 11/20/22 1156     Visit Number 6    Number of Visits 16    Date for PT Re-Evaluation 12/18/22    Authorization Type [employer] MCR    Progress Note Due on Visit 10    PT Start Time 1150    PT Stop Time 1230    PT Time Calculation (min) 40 min    Activity Tolerance Patient limited by pain;Patient limited by fatigue    Behavior During Therapy [facility] for tasks assessed/performed                  Past Medical History:  Diagnosis Date   Anxiety    Arthritis    Depression    Fatty liver    Fibromyalgia    Gout 04/2017   Hyperlipidemia    Hypertension    Hypothyroidism    pt no longer takes meds- just monitoring thyroid levels   Low kidney function    followed by Dr Nicholos Johns   Neuropathy    in both feet   Spinal stenosis    Past Surgical History:  Procedure Laterality Date   BACK SURGERY  09/13/2021   Carpel Tunnel Surgery Right 10/17/2018   both wrists   CATARACT EXTRACTION W/ INTRAOCULAR LENS IMPLANT Bilateral 10/25/2015   Right eye was first.  Left eye done 10/17.   CERVICAL BIOPSY  W/ LOOP ELECTRODE EXCISION  12/2008   hx CIN II   COLONOSCOPY     LAMINECTOMY WITH POSTERIOR LATERAL ARTHRODESIS LEVEL 1 Bilateral 08/10/2022   Procedure: Reexploration of Fusion Lumbar Five- Sacral One with Redo Postero-Lateral Fusion Replacement of Hardware and Posterio Lateral Arthodesis;  Surgeon: Donalee Citrin, MD;  Location: [facility] OR;  Service: Neurosurgery;  Laterality: Bilateral;   LUMBAR FUSION  2021   RADIOLOGY WITH ANESTHESIA N/A 07/26/2020   Procedure: MRI WITH ANESTHESIA  LUMBAR WITH AND WITHOUT CONTRAST;  Surgeon: Radiologist, Medication, MD;  Location: [facility] OR;  Service: Radiology;  Laterality: N/A;   Patient Active Problem List   Diagnosis Date Noted   Pseudoarthrosis of lumbar spine  08/10/2022   Mixed hyperlipidemia 10/20/2021   Palpitations 10/20/2021   Spinal stenosis of lumbar region 09/13/2021   Spondylolisthesis at L4-L5 level 01/13/2020   Pain in left knee 05/28/2017   Elevated lipids 12/30/2014   History of cervical dysplasia 12/30/2014   Essential hypertension 09/10/2013   Chest pain 08/28/2013    PCP: Darryll Capers, PA-C   REFERRING PROVIDER: Donalee Citrin, MD   REFERRING DIAG: S32.009K (ICD-10-CM) - Unspecified fracture of unspecified lumbar vertebra, subsequent encounter for fracture with nonunion  Lumbar Pseudoarthrosis  Rationale for Evaluation and Treatment: Rehabilitation  THERAPY DIAG:  Chronic bilateral low back pain with right-sided sciatica  Sacral back pain  Unsteadiness on feet  Muscle weakness (generalized)  ONSET DATE: 08-10-22 laminectomy  Bil PLIF L5 S1  SUBJECTIVE:  SUBJECTIVE STATEMENT:  Patricia Parks enters clinic  with 3/10 pain and walking very slowly with dependence on SPC. Session today concentrated on lifting principles, deadlifting and hip hinge and strengthening.  She states she had to pick up sticks in the back yard and tried to walk around the perimeter of the yard but she has had difficulty with moving since last Thursday. I had to skip cleaning the house last weekend because I was hurting so much  EVAL-I had back surgery again in June and pain is not getting better. I have had 3 back surgeries now.  I have trouble walking and in my bil legs but I have R pain down into the bottom of my whole R foot.. Left leg pain also down to my feet. A neurologist Dr Allena Katz told me I have neuropathy. I have had fibromyalgia for about 15 years. My right leg makes wake up with pain at night. I try to sleep with legs between my  legs and one against my back for  supports.  I wake up every 2-3 hours at night.  I walk up and down the hall.. I try to do Upper body exercise 2 x a week. I was a member of [employer]. I am unable to drive due to not being able to feel my legs  R worse than L  PERTINENT HISTORY:  Spinal stenosis, neuropathy in bil feet, low kidney fx, Hypothyroidism HTN gout , Fibromyalgia, depression, OA. Osteopenia, lumbar fusion 2021, Laminectomy Bi PLIF L5/s1 08-10-22, carpal tunnel R 10-17-2018. Exploratory back surgery 07/23  PAIN:  Are you having pain? Yes: NPRS scale: at rest after a pain pill 1/10  but when moving or decrease pain  6/10    at very worst it would be 8-9/10/10 Pain location: Back pain on left is 6/10 and at worst 8-9/10  and down R leg   Pain description: Left back pain is stabbing.  Constantly  and   R leg pain wakes up at night with stabbing pain but during the day it is tingling Aggravating factors: walking, sleeping at night, sometimes pacing at night helps  but any moving  sitting max 30 min depending on chair.   Standing 10-15 min.  Walking  5 minutes Relieving factors: pacing sometimes for a short time  PRECAUTIONS: None  RED FLAGS: Bowel or bladder incontinence: No   WEIGHT BEARING RESTRICTIONS: No  FALLS:  Has patient fallen in last 6 months? No but pt reports feeling unsteady and stumbles at times  LIVING ENVIRONMENT: Lives with: lives with their spouse Lives in: House/apartment Stairs: Yes: External: 3 steps; on right going up Has following equipment at home: Single point cane grab bars  a 2 wheeled walker  OCCUPATION: retired  Customer service manager  PLOF: Independent with household mobility with device  PATIENT GOALS:  TPDN and I would like to be stronger and not feel feeble  NEXT MD VISIT: TBD  OBJECTIVE:   DIAGNOSTIC FINDINGS:  CLINICAL DATA:  Lumbar region back pain radiating to the right leg. Previous lumbosacral surgeries.   EXAM: CT LUMBAR SPINE WITHOUT CONTRAST    TECHNIQUE: Multidetector CT imaging of the lumbar spine was performed without intravenous contrast administration. Multiplanar CT image reconstructions were also generated.   RADIATION DOSE REDUCTION: This exam was performed according to the departmental dose-optimization program which includes automated exposure control, adjustment of the mA and/or kV according to patient size and/or use of iterative reconstruction technique.   COMPARISON:  Radiography 09/05/2022.  CT 05/18/2022.   FINDINGS: Segmentation: 5 lumbar type vertebral bodies as numbered previously.   Alignment: Chronic fixed scoliotic curvature convex to the right. 2 mm degenerative retrolisthesis at L1-2 is unchanged.   Vertebrae: No new vertebral finding. See below for details at each level.   Paraspinal and other soft tissues: No significant finding.   Disc levels:   T11-12 and T12-L1: Normal   L1-2: Chronic degenerative retrolisthesis of 2 mm. Disc degeneration with bulging of the disc worse on the left. Facet degeneration and hypertrophy. Stenosis of the lateral recesses and foramina, left worse than right. Similar appearance to the study of March.   L2-3 and L3-4: Previous posterior decompression, diskectomy and fusion. Solid union with sufficient patency of the canal and foramina.   L4-5: Previous posterior decompression, diskectomy and fusion. Solid union with sufficient patency of the canal and foramina.   L5-S1: Previous posterior decompression, diskectomy and fusion procedure. Continued evidence of nonunion with nitrogen gas in the disc space and lucency around the S1 screws, left more than right. Central canal as well decompressed. There is foraminal narrowing on the right as seen previously that could possibly affect the L5 nerve.   IMPRESSION: 1. No change since the study of March of this year. Previous posterior decompression, diskectomy and fusion procedures from L2-3 through L4-5 with  solid union and sufficient patency of the canal and foramina. 2. Continued evidence of nonunion at the L5-S1 level with nitrogen gas in the disc space and lucency around the S1 screws, left more than right. No change in foraminal narrowing on the right at L5-S1. 3. L1-2 degenerative disc disease with 2 mm degenerative retrolisthesis. Stenosis of the lateral recesses and foramina, left worse than right. Similar appearance to the study of March.     Electronically Signed   By: Paulina Fusi M.D.   On: 10/07/2022 13:55  PATIENT SURVEYS:  FOTE  36%  predicted 43% 11-20-22  44% SCREENING FOR RED FLAGS: Bowel or bladder incontinence: No  COGNITION: Overall cognitive status: Within functional limits for tasks assessed     SENSATION: Pt with bil neuropathy and avoids driving car due to not being able to feel the brake pedal  MUSCLE LENGTH: Hamstrings:  WFL   POSTURE: rounded shoulders, forward head, and left pelvic level higher  Left anterior innominate rotation  PALPATION: Pt with Left SI sensitivity and + sacral compression and distraction test.  Pt with decreased pain with R LAD for R radiculopathy.  Pt with pain with palpation over Right Lumbar l-3 to S1 TTP over Left SI jt LUMBAR ROM:   AROM eval 11-20-22  Flexion Fingertips to floor Fingertips to floor  Extension 20% L low back 50% low back  Right lateral flexion Fingertips 2 inch below knee jt line pulling pain on left Fingertips 2 inch below knee jt line pulling pain on lef  Left lateral flexion Fingertips to knee jt line pain on L Fingertips 1 inch below knee jt line pulling pain on right  Right rotation 50% 60%  Left rotation 50% 60%   (Blank rows = not tested)  LOWER EXTREMITY ROM:   All WNL unless other wise noted  Active  Right eval Left eval   Hip flexion Standing 55  Standing  60   Hip extension     Hip abduction     Hip adduction     Hip internal rotation 20    Hip external rotation     Knee flexion  Knee extension     Ankle dorsiflexion     Ankle plantarflexion     Ankle inversion     Ankle eversion      (Blank rows = not tested)  LOWER EXTREMITY MMT:    MMT Right eval Left eval R/L 11-20-22  Hip flexion 3- 4- 4-/4-  Hip extension 3- 4- 4-/4-  Hip abduction 3- 4- 4-/4-  Hip adduction     Hip internal rotation     Hip external rotation     Knee flexion 4 4 4/4+  Knee extension 4 4 4/4  Ankle dorsiflexion     Ankle plantarflexion     Ankle inversion     Ankle eversion      (Blank rows = not tested)  LUMBAR SPECIAL TESTS:  NT due to lumbar surgery in June 2024  FUNCTIONAL TESTS:  5 times sit to stand: 30.06 sec   30ft with SPC 11-12-22 526.8 ft (91290 ft - 1755 ft) 11-20-22 5 x STS   18.47 ec GAIT: Distance walked: 150 feet  then 99 ft for Assistive device utilized: Single point cane Level of assistance: Modified independence Comments: Pt walks cautiously and very slowly and unsteady , not confident with gait           TODAY'S TREATMENT:  [facility] Adult PT Treatment:                                                DATE: 11-20-22 Therapeutic Exercise: STS with 10 lb  3 x 10 Knee ext Omega  2 x 10 bil Knee flexion Omega 2 x 10 Reverse lunge with UE on chair back and counter 1 x 10 on R and L Practicing hip hinge  against wall and with chair for safety  without making contact for tempo squats Deadlift  with 10 lb x 5 on 8 inch elevated box Hip hinge with back to 8 inch elevated box and VC and TC for proper hip hinge form Self Care: Sleep hygiene Lifting principles Deadlifting and protection of back  Endurance exercise education with plan for 15 min timed period [facility] Adult PT Treatment:                                                DATE: 11-12-22 Therapeutic Exercise: Supine Pelvic Tilt   2 sets - 10 reps Supine SKTC  1 sets - 5 reps - 10 hold Supine Lower Trunk Rotation   5 x 20 sec R and L 526.8 ft (91290 ft - 1755 ft) Manual  Therapy: STW of  left QL and over lumbar and sacral paraspinals Trigger Point Dry-Needling performed     by Garen Lah Treatment instructions: Expect mild to moderate muscle soreness. S/S of pneumothorax if dry needled over a lung field, and to seek immediate medical attention should they occur. Patient verbalized understanding of these instructions and education.  Patient Consent Given: Yes Education handout provided: Previously provided Muscles treated:  L  QL and  L lumbar/sacral paraspinals. L Gluteals Electrical stimulation performed: No Parameters: N/A Treatment response/outcome: twitch response noted, pt noted relief [facility] Adult PT Treatment:  DATE: 11-07-22 Pt enters building ambulating independently using a SPC to ambulate. Treatment took place in water 3.8 to  4 ft 8 in.feet deep depending upon activity.  Pt entered and exited the pool via stair and handrails independently. Pt initiated Rx with 6/10 pain with increased new pain down my left side and it just started last night. At end of session 4/10 Acclimating to water by walking back and forth and side stepping using rainbow DB in bil UE for security and balance in water Aquatic Exercise:   Pt hip hinge using pool noodle  Gentle lumbar rotation with pool noodle yellow Thoracic AROM at edge of pool Kickboard side to side resistance Hip abduction/adduction x10 BIL Hip ext/flex with knee straight R x 10 Hip circles with CCW and CW   Water step ups on submerged step 10 x R and L  SL step in water without aquatic flotation to work on balance with turbulence in water  SL stand and kicking for balance without UE support Squats x 20    Bad Ragaz, Pt with lumbar belt around hips and nek doodle for neck support.  PT at torso and assisting with trunk left to right and vice versa to engage trunk muscles. PT then rotated trunk in order to engage abdominal (internal and external obliques)  Emphasis on breathing techniques to draw in abdominals for support.  Pt then utilizing posterior chain and engaging Hip extension and knee flexion with water resistance . LAD over BIL LE.     Pt requires the buoyancy of water for active assisted exercises with buoyancy supported for strengthening and AROM exercises. Hydrostatic pressure also supports joints by unweighting joint load by at least 50 % in 3-4 feet depth water. 80% in chest to neck deep water. Water will provide assistance with movement using the current and laminar flow while the buoyancy reduces weight bearing. Pt requires the viscosity of the water for resistance endurance  with strengthening exercises.         [facility] Adult PT Treatment:                                                DATE: 11-01-22 Therapeutic Exercise: STS 1 x 10 STS 2 x 10 with 6 lb weight  Step ups with 6 inch with CGA x 1  1 x 10 R and L Supine Pelvic Tilt   3 sets - 10 reps Supine SKTC  1 sets - 5 reps - 10 hold Supine Lower Trunk Rotation   5 x 20 sec R and L Manual Therapy: STW of  left QL and over lumbar and sacral paraspinals Trigger Point Dry-Needling performed     by Garen Lah Treatment instructions: Expect mild to moderate muscle soreness. S/S of pneumothorax if dry needled over a lung field, and to seek immediate medical attention should they occur. Patient verbalized understanding of these instructions and education.  Patient Consent Given: Yes Education handout provided: Previously provided Muscles treated:  L  QL and  L lumbar/sacral paraspinals L5/s1 Electrical stimulation performed: No Parameters: N/A Treatment response/outcome: twitch response noted, pt noted relief  [facility] Adult PT Treatment:  DATE: 10-24-22 Pt enters building ambulating independently. Treatment took place in water 3.8 to  4 ft 8 in.feet deep depending upon activity.  Pt entered and exited the pool via stair and  handrails independently. Pt initiated Rx with 7/10 pain. At end of session 4/10  Acclimating to water by walking back and forth and side stepping using rainbow DB in bil UE for security and balance in water Patricia.  Ahlert was educated on  beneficial therapeutic effects of water while ambulating to acclimate to water walking forward, backward and side stepping.  Pt educated on neutral posture and hip hinging in seated position with water at chest level x 10 with stretch to low back and then x 10 with back at pool wall at external cue, VC for neck tucked to prevent hyperextension.  Also reinforced with STS on submerge bench and with submerged step with 90/90 hip for greater depth of squat. Aquatic Exercise:   Pt hip hinge using pool noodle and then gentle lumbar rotataion Thoracic AROM at edge of pool Hip abduction/adduction x10 BIL Hip ext/flex with knee straight R x 20   Standing back stretch with  UE on edge of pool  KB up and down press   Squats x 20 Heel and toe raises Toe yoga in pool Ambulating forward, backward and side stepping with CGA x 1 at beginning of session and then I with rainbow DB at end of session for 5 rounds of 30 feet. each      Pt requires the buoyancy of water for active assisted exercises with buoyancy supported for strengthening and AROM exercises. Hydrostatic pressure also supports joints by unweighting joint load by at least 50 % in 3-4 feet depth water. 80% in chest to neck deep water. Water will provide assistance with movement using the current and laminar flow while the buoyancy reduces weight bearing. Pt requires the viscosity of the water for resistance endurance  with strengthening exercises.                                                                                                                              DATE: EVAL 10-23-22  issue HEP    PATIENT EDUCATION:  Education details: POC Explanation of findings, FOTO, issue HEP Person educated:  Patient Education method: Explanation, Demonstration, Tactile cues, Verbal cues, and Handouts Education comprehension: verbalized understanding, returned demonstration, verbal cues required, tactile cues required, and needs further education  HOME EXERCISE PROGRAM: Access Code: [email or web address] URL: [email or web address] Date: 10/23/2022 Prepared by: Garen Lah  Exercises - Supine Pelvic Tilt  - 1 x daily - 7 x weekly - 3 sets - 10 reps - Supine Single Knee to Chest Stretch  - 2 x daily - 7 x weekly - 1 sets - 5 reps - 10 hold - Supine Lower Trunk Rotation  - 2 x daily - 7 x weekly - 1 sets - 5 reps - 20 hold - Sit to stand with sink  support Movement snack  - 1 x daily - 7 x weekly - 3 sets - 10 reps Added 11-01-22 - Supine Bridge with Mini Swiss Ball Between Knees  - 1 x daily - 7 x weekly - 3 sets - 10 reps ASSESSMENT:  CLINICAL IMPRESSION: Session today focused on FOTO  44% and completion of all STG and LTG # 6 for FOTO score,  Pt received education on sleep hygiene and lifting principles and endurance exercise walking, Pt seemed discouraged about progress but pt has much improved 5 x STS from 30.06 to 18.47 sec today.  Pt has trouble sleeping but did state she did some stretches and was able to go back to sleep.  Pt is very deconditioned and needs to increase progressive strengthening to decrease risk of fall and increase steadiness with ambulation.      EVAL- Patient is a 77  y.o. female who was seen today for physical therapy evaluation and treatment for continuing pain post surgeries (2) and most recent . Laminectomy Bil PLIF L5/S1 08-10-22, Pt has difficulty walking and remains sedentary due to pain. She has hx of fibromyalgia and bil neuropathy with complaints of pain in bil bottom of feet.  She reports she does UE exercises but she does not feel safe standing and walking. Patricia Snellen sleep is disturbed and she cannot sleep for longer than 2 hours at night.  Pt arrives at PT  to try to get stronger and to improve her ability to move.  She seeks TPDN, HEP and aquatics to help her achieve her goals.  Pt also has some Positive signs of Left SI jt irritation. Pt will benefit from skilled PT to address her impairment and her immobility due to pain and weakness to return her to a more active lifestyle and continuance of healthy habits.  OBJECTIVE IMPAIRMENTS: decreased activity tolerance, decreased balance, decreased mobility, difficulty walking, decreased ROM, decreased strength, postural dysfunction, obesity, and pain.   ACTIVITY LIMITATIONS: bending, standing, squatting, sleeping, stairs, transfers, bed mobility, and locomotion level  PARTICIPATION LIMITATIONS: meal prep, cleaning, laundry, shopping, and community activity  PERSONAL FACTORS: Spinal stenosis, neuropathy in bil feet, low kidney fx, Hypothyroidism HTN gout , Fibromyalgia, depression, OA. Osteopenia, lumbar fusion 2021, Laminectomy Bi PLIF L5/s1 08-10-22, carpal tunnel R 10-17-2018. Exploratory back surgery 07/23 are also affecting patient's functional outcome.   REHAB POTENTIAL:Good but challenging due to bil neuropathy and multiple back surgeries  CLINICAL DECISION MAKING: Evolving/moderate complexity  EVALUATION COMPLEXITY: Moderate   GOALS: Goals reviewed with patient? Yes  SHORT TERM GOALS: Target date: 11-20-22  Pt will be I with initial HEP Baseline:no knowledge Goal status:MET  2.  Pt pain will be decreased  by 25% Baseline: 9/10 at eval at worst. 11-12-22  at worst now still a 9/10 11-20-22  Pt 3/10 today  Goal status: MET  3.  Pt will be able to sleep at night for 3 or more hours of consecutive sleep without awakening due to pain Baseline: wake every 2-3 hours 11-12-22 Pt had bad weekend and waking every 2 hours and went back to sleep for 3 hours 11-20-22 my Right leg has pain I got up and did stretches and it helped  I sleep  3 hours Goal status:MET  4.  Pt will be able to perform 6 MWT  without fatigueing Baseline:  fatigued after 2 MWT  99 ft 11-12-22 526.8 ft (91290 ft - 1755 ft) Goal status:MET  5.  Pt will be able to be educated  in aquatic therapy for exercise in environment with decreased joint loading.  Baseline: No knowledge about aquatics 11-12-22  has been to aquatics x 2 Goal status:MET   LONG TERM GOALS: Target date: 12-18-22  Pt will be able to be I with advanced HEP Baseline:  Goal status: ONGOING  2.  Pt pain will decrease for functional activities  and household chores by 50% Baseline: unable to stand for 5 min and cannot complete chores Goal status: INITIAL  3.  Pt will be able to perform 5 x STS with 15 sec or less to show increased LE strength Baseline: 30.06 sec 11-20-22 5 x STS   18.47 ec Goal status: ONGOING  4.  will be able to lift 15# from the floor, not limited by pain, to allow completion of housework  Baseline: unable to lift items from floor Goal status: INITIAL  5.  Pt will report 4 or more hours of uninterrupted sleep due to pain Baseline: Awakes every 2-3 hours due to pain at night 11-20-22  3 hours Goal status:ONGOING  6.  FOTO will improve from  36%  to 43%    indicating improved functional mobility.  Baseline: EVAL 36% 11-20-22  44% Goal status: MET  PLAN:  PT FREQUENCY: 1-2x/week  PT DURATION: 8 weeks  PLANNED INTERVENTIONS: Therapeutic exercises, Therapeutic activity, Neuromuscular re-education, Balance training, Gait training, Patient/Family education, Self Care, Joint mobilization, Stair training, DME instructions, Aquatic Therapy, Dry Needling, Electrical stimulation, Spinal mobilization, Cryotherapy, Moist heat, Taping, Manual therapy, and Re-evaluation.  PLAN FOR NEXT SESSION: TPDN. Aquatics  HEP  Left SI RX  Garen Lah, PT, [facility] Certified Exercise Expert for the Aging Adult  11/20/22 12:54 PM Phone: [phone] Fax: [phone]

## 2022-11-14 ENCOUNTER — Ambulatory Visit: Payer: Medicare HMO | Admitting: Physical Therapy

## 2022-11-17 ENCOUNTER — Other Ambulatory Visit: Payer: Self-pay | Admitting: Internal Medicine

## 2022-11-20 ENCOUNTER — Ambulatory Visit: Payer: Medicare HMO | Admitting: Physical Therapy

## 2022-11-20 DIAGNOSIS — M6281 Muscle weakness (generalized): Secondary | ICD-10-CM

## 2022-11-20 DIAGNOSIS — R2681 Unsteadiness on feet: Secondary | ICD-10-CM

## 2022-11-20 DIAGNOSIS — M533 Sacrococcygeal disorders, not elsewhere classified: Secondary | ICD-10-CM

## 2022-11-20 DIAGNOSIS — G8929 Other chronic pain: Secondary | ICD-10-CM

## 2022-11-20 DIAGNOSIS — M5441 Lumbago with sciatica, right side: Secondary | ICD-10-CM | POA: Diagnosis not present

## 2022-11-20 NOTE — Patient Instructions (Signed)
Sleep Tips  from Blain Pais  Why we Sleep book   Keep a consistent sleep schedule. Get up at the same time every day, even on weekends or during vacations. Set a bedtime that is early enough for you to get at least 7 hours of sleep. Don't go to bed unless you are sleepy.  If you don't fall asleep after 20 minutes, get out of bed.  Establish a relaxing bedtime routine.  Use your bed only for sleep and sex.  Make your bedroom quiet and relaxing. Keep the room at a comfortable, cool temperature. 65 degrees Limit exposure to bright light in the evenings. Turn off electronic devices at least 30 minutes before bedtime. Don't eat a large meal before bedtime. If you are hungry at night, eat a light, healthy snack.  Exercise regularly and maintain a healthy diet.  Avoid consuming caffeine in the late afternoon or evening.  Avoid consuming alcohol before bedtime.  Reduce your fluid intake before bedtime.  Garen Lah, PT, [facility] Certified Exercise Expert for the Aging Adult  11/20/22 12:09 PM Phone: [phone] Fax: [phone]

## 2022-11-22 ENCOUNTER — Encounter: Payer: Self-pay | Admitting: Physical Therapy

## 2022-11-22 ENCOUNTER — Ambulatory Visit: Payer: Medicare HMO | Admitting: Physical Therapy

## 2022-11-22 DIAGNOSIS — R2681 Unsteadiness on feet: Secondary | ICD-10-CM

## 2022-11-22 DIAGNOSIS — M533 Sacrococcygeal disorders, not elsewhere classified: Secondary | ICD-10-CM

## 2022-11-22 DIAGNOSIS — M6281 Muscle weakness (generalized): Secondary | ICD-10-CM

## 2022-11-22 DIAGNOSIS — G8929 Other chronic pain: Secondary | ICD-10-CM

## 2022-11-22 DIAGNOSIS — M5441 Lumbago with sciatica, right side: Secondary | ICD-10-CM | POA: Diagnosis not present

## 2022-11-22 NOTE — Therapy (Signed)
OUTPATIENT PHYSICAL THERAPY THORACOLUMBAR EVALUATION   Patient Name: Patricia Parks MRN: [id number] DOB:[birth year], 77 y.o., female Today's Date: 11/22/2022  END OF SESSION:  PT End of Session - 11/22/22 1104     Visit Number 7    Number of Visits 16    Date for PT Re-Evaluation 12/18/22    Authorization Type [employer] MCR    PT Start Time 1104    PT Stop Time 1145    PT Time Calculation (min) 41 min    Activity Tolerance Patient limited by pain;Patient limited by fatigue    Behavior During Therapy [facility] for tasks assessed/performed                  Past Medical History:  Diagnosis Date   Anxiety    Arthritis    Depression    Fatty liver    Fibromyalgia    Gout 04/2017   Hyperlipidemia    Hypertension    Hypothyroidism    pt no longer takes meds- just monitoring thyroid levels   Low kidney function    followed by Dr Nicholos Johns   Neuropathy    in both feet   Spinal stenosis    Past Surgical History:  Procedure Laterality Date   BACK SURGERY  09/13/2021   Carpel Tunnel Surgery Right 10/17/2018   both wrists   CATARACT EXTRACTION W/ INTRAOCULAR LENS IMPLANT Bilateral 10/25/2015   Right eye was first.  Left eye done 10/17.   CERVICAL BIOPSY  W/ LOOP ELECTRODE EXCISION  12/2008   hx CIN II   COLONOSCOPY     LAMINECTOMY WITH POSTERIOR LATERAL ARTHRODESIS LEVEL 1 Bilateral 08/10/2022   Procedure: Reexploration of Fusion Lumbar Five- Sacral One with Redo Postero-Lateral Fusion Replacement of Hardware and Posterio Lateral Arthodesis;  Surgeon: Donalee Citrin, MD;  Location: [facility] OR;  Service: Neurosurgery;  Laterality: Bilateral;   LUMBAR FUSION  2021   RADIOLOGY WITH ANESTHESIA N/A 07/26/2020   Procedure: MRI WITH ANESTHESIA  LUMBAR WITH AND WITHOUT CONTRAST;  Surgeon: Radiologist, Medication, MD;  Location: [facility] OR;  Service: Radiology;  Laterality: N/A;   Patient Active Problem List   Diagnosis Date Noted   Pseudoarthrosis of lumbar spine 08/10/2022   Mixed hyperlipidemia  10/20/2021   Palpitations 10/20/2021   Spinal stenosis of lumbar region 09/13/2021   Spondylolisthesis at L4-L5 level 01/13/2020   Pain in left knee 05/28/2017   Elevated lipids 12/30/2014   History of cervical dysplasia 12/30/2014   Essential hypertension 09/10/2013   Chest pain 08/28/2013    PCP: Darryll Capers, PA-C   REFERRING PROVIDER: Donalee Citrin, MD   REFERRING DIAG: S32.009K (ICD-10-CM) - Unspecified fracture of unspecified lumbar vertebra, subsequent encounter for fracture with nonunion  Lumbar Pseudoarthrosis  Rationale for Evaluation and Treatment: Rehabilitation  THERAPY DIAG:  Chronic bilateral low back pain with right-sided sciatica  Sacral back pain  Unsteadiness on feet  Muscle weakness (generalized)  ONSET DATE: 08-10-22 laminectomy  Bil PLIF L5 S1  SUBJECTIVE:  SUBJECTIVE STATEMENT:  [name] enters clinic  with 4/10 pain and walking very slowly.  I stopped driving because I didn't feel safe. I did go out and celebrate the harvest moon and I had to come home and go to bed. I have not been feeling better since my surgery in June.  I think my numbness is worse in my right leg and I feel like my left leg is getting numb.  EVAL-I had back surgery again in June and pain is not getting better. I have had 3 back surgeries now.  I have trouble walking and in my bil legs but I have R pain down into the bottom of my whole R foot.. Left leg pain also down to my feet. A neurologist Dr Allena Katz told me I have neuropathy. I have had fibromyalgia for about 15 years. My right leg makes wake up with pain at night. I try to sleep with legs between my  legs and one against my back for supports.  I wake up every 2-3 hours at night.  I walk up and down the hall.. I try to do Upper body exercise 2 x a week. I  was a member of [employer]. I am unable to drive due to not being able to feel my legs  R worse than L  PERTINENT HISTORY:  Spinal stenosis, neuropathy in bil feet, low kidney fx, Hypothyroidism HTN gout , Fibromyalgia, depression, OA. Osteopenia, lumbar fusion 2021, Laminectomy Bi PLIF L5/s1 08-10-22, carpal tunnel R 10-17-2018. Exploratory back surgery 07/23  PAIN:  Are you having pain? Yes: NPRS scale: at rest after a pain pill 1/10  but when moving or decrease pain  6/10    at very worst it would be 8-9/10/10 Pain location: Back pain on left is 6/10 and at worst 8-9/10  and down R leg   Pain description: Left back pain is stabbing.  Constantly  and   R leg pain wakes up at night with stabbing pain but during the day it is tingling Aggravating factors: walking, sleeping at night, sometimes pacing at night helps  but any moving  sitting max 30 min depending on chair.   Standing 10-15 min.  Walking  5 minutes Relieving factors: pacing sometimes for a short time  PRECAUTIONS: None  RED FLAGS: Bowel or bladder incontinence: No   WEIGHT BEARING RESTRICTIONS: No  FALLS:  Has patient fallen in last 6 months? No but pt reports feeling unsteady and stumbles at times  LIVING ENVIRONMENT: Lives with: lives with their spouse Lives in: House/apartment Stairs: Yes: External: 3 steps; on right going up Has following equipment at home: Single point cane grab bars  a 2 wheeled walker  OCCUPATION: retired  Customer service manager  PLOF: Independent with household mobility with device  PATIENT GOALS:  TPDN and I would like to be stronger and not feel feeble  NEXT MD VISIT: TBD  OBJECTIVE:   DIAGNOSTIC FINDINGS:  CLINICAL DATA:  Lumbar region back pain radiating to the right leg. Previous lumbosacral surgeries.   EXAM: CT LUMBAR SPINE WITHOUT CONTRAST   TECHNIQUE: Multidetector CT imaging of the lumbar spine was performed without intravenous contrast administration. Multiplanar CT  image reconstructions were also generated.   RADIATION DOSE REDUCTION: This exam was performed according to the departmental dose-optimization program which includes automated exposure control, adjustment of the mA and/or kV according to patient size and/or use of iterative reconstruction technique.   COMPARISON:  Radiography 09/05/2022.  CT 05/18/2022.  FINDINGS: Segmentation: 5 lumbar type vertebral bodies as numbered previously.   Alignment: Chronic fixed scoliotic curvature convex to the right. 2 mm degenerative retrolisthesis at L1-2 is unchanged.   Vertebrae: No new vertebral finding. See below for details at each level.   Paraspinal and other soft tissues: No significant finding.   Disc levels:   T11-12 and T12-L1: Normal   L1-2: Chronic degenerative retrolisthesis of 2 mm. Disc degeneration with bulging of the disc worse on the left. Facet degeneration and hypertrophy. Stenosis of the lateral recesses and foramina, left worse than right. Similar appearance to the study of March.   L2-3 and L3-4: Previous posterior decompression, diskectomy and fusion. Solid union with sufficient patency of the canal and foramina.   L4-5: Previous posterior decompression, diskectomy and fusion. Solid union with sufficient patency of the canal and foramina.   L5-S1: Previous posterior decompression, diskectomy and fusion procedure. Continued evidence of nonunion with nitrogen gas in the disc space and lucency around the S1 screws, left more than right. Central canal as well decompressed. There is foraminal narrowing on the right as seen previously that could possibly affect the L5 nerve.   IMPRESSION: 1. No change since the study of March of this year. Previous posterior decompression, diskectomy and fusion procedures from L2-3 through L4-5 with solid union and sufficient patency of the canal and foramina. 2. Continued evidence of nonunion at the L5-S1 level with nitrogen gas  in the disc space and lucency around the S1 screws, left more than right. No change in foraminal narrowing on the right at L5-S1. 3. L1-2 degenerative disc disease with 2 mm degenerative retrolisthesis. Stenosis of the lateral recesses and foramina, left worse than right. Similar appearance to the study of March.     Electronically Signed   By: Paulina Fusi M.D.   On: 10/07/2022 13:55  PATIENT SURVEYS:  FOTE  36%  predicted 43% 11-20-22  44% SCREENING FOR RED FLAGS: Bowel or bladder incontinence: No  COGNITION: Overall cognitive status: Within functional limits for tasks assessed     SENSATION: Pt with bil neuropathy and avoids driving car due to not being able to feel the brake pedal  MUSCLE LENGTH: Hamstrings:  WFL   POSTURE: rounded shoulders, forward head, and left pelvic level higher  Left anterior innominate rotation  PALPATION: Pt with Left SI sensitivity and + sacral compression and distraction test.  Pt with decreased pain with R LAD for R radiculopathy.  Pt with pain with palpation over Right Lumbar l-3 to S1 TTP over Left SI jt LUMBAR ROM:   AROM eval 11-20-22  Flexion Fingertips to floor Fingertips to floor  Extension 20% L low back 50% low back  Right lateral flexion Fingertips 2 inch below knee jt line pulling pain on left Fingertips 2 inch below knee jt line pulling pain on lef  Left lateral flexion Fingertips to knee jt line pain on L Fingertips 1 inch below knee jt line pulling pain on right  Right rotation 50% 60%  Left rotation 50% 60%   (Blank rows = not tested)  LOWER EXTREMITY ROM:   All WNL unless other wise noted  Active  Right eval Left eval   Hip flexion Standing 55  Standing  60   Hip extension     Hip abduction     Hip adduction     Hip internal rotation 20    Hip external rotation     Knee flexion     Knee extension  Ankle dorsiflexion     Ankle plantarflexion     Ankle inversion     Ankle eversion      (Blank rows = not  tested)  LOWER EXTREMITY MMT:    MMT Right eval Left eval R/L 11-20-22  Hip flexion 3- 4- 4-/4-  Hip extension 3- 4- 4-/4-  Hip abduction 3- 4- 4-/4-  Hip adduction     Hip internal rotation     Hip external rotation     Knee flexion 4 4 4/4+  Knee extension 4 4 4/4  Ankle dorsiflexion     Ankle plantarflexion     Ankle inversion     Ankle eversion      (Blank rows = not tested)  LUMBAR SPECIAL TESTS:  NT due to lumbar surgery in June 2024  FUNCTIONAL TESTS:  5 times sit to stand: 30.06 sec   49ft with SPC 11-12-22 526.8 ft (1290 ft - 1755 ft) 11-22-22  6 MWT  65ft  ( norm 1290 ft - 1755 ft) 11-20-22 5 x STS   18.47 ec  GAIT: Distance walked: 150 feet  then 99 ft for Assistive device utilized: Single point cane Level of assistance: Modified independence Comments: Pt walks cautiously and very slowly and unsteady , not confident with gait           TODAY'S TREATMENT:  [facility] Adult PT Treatment:                                                DATE: 11-22-22 Therapeutic Exercise: 6 MWT  649ft  ( norm 1290 ft - 1755 ft) Prone hip extension 2 x 10 on R and L   L and R sidelying  2 x 10 hip abduction Bridging with pillow squeeze  4 sets of 5 to reduce hamstring cramping Manual Therapy: STW of  BIl QL and over lumbar and sacral paraspinals Trigger Point Dry-Needling performed     by Garen Lah Treatment instructions: Expect mild to moderate muscle soreness. S/S of pneumothorax if dry needled over a lung field, and to seek immediate medical attention should they occur. Patient verbalized understanding of these instructions and education.  Patient Consent Given: Yes Education handout provided: Previously provided Muscles treated:  Bil  QL and  Bil lumbar/sacral paraspinals. Bil Gluteals Electrical stimulation performed: No Parameters: N/A Treatment response/outcome: twitch response noted, pt noted relief  [facility] Adult PT Treatment:                                                 DATE: 11-20-22 Therapeutic Exercise: STS with 10 lb  3 x 10 Knee ext Omega  2 x 10 bil Knee flexion Omega 2 x 10 Reverse lunge with UE on chair back and counter 1 x 10 on R and L Practicing hip hinge  against wall and with chair for safety  without making contact for tempo squats Deadlift  with 10 lb x 5 on 8 inch elevated box Hip hinge with back to 8 inch elevated box and VC and TC for proper hip hinge form Self Care: Sleep hygiene Lifting principles Deadlifting and protection of back  Endurance exercise education with plan for 15 min timed period  [facility] Adult PT Treatment:                                                DATE: 11-12-22 Therapeutic Exercise: Supine Pelvic Tilt   2 sets - 10 reps Supine SKTC  1 sets - 5 reps - 10 hold Supine Lower Trunk Rotation   5 x 20 sec R and L 526.8 ft (91290 ft - 1755 ft) Manual Therapy: STW of  left QL and over lumbar and sacral paraspinals Trigger Point Dry-Needling performed     by Garen Lah Treatment instructions: Expect mild to moderate muscle soreness. S/S of pneumothorax if dry needled over a lung field, and to seek immediate medical attention should they occur. Patient verbalized understanding of these instructions and education.  Patient Consent Given: Yes Education handout provided: Previously provided Muscles treated:  L  QL and  L lumbar/sacral paraspinals. L Gluteals Electrical stimulation performed: No Parameters: N/A Treatment response/outcome: twitch response noted, pt noted relief [facility] Adult PT Treatment:                                                DATE: 11-07-22 Pt enters building ambulating independently using a SPC to ambulate. Treatment took place in water 3.8 to  4 ft 8 in.feet deep depending upon activity.  Pt entered and exited the pool via stair and handrails independently. Pt initiated Rx with 6/10 pain with increased new pain down my left side and it just started last night. At end of  session 4/10 Acclimating to water by walking back and forth and side stepping using rainbow DB in bil UE for security and balance in water Aquatic Exercise:   Pt hip hinge using pool noodle  Gentle lumbar rotation with pool noodle yellow Thoracic AROM at edge of pool Kickboard side to side resistance Hip abduction/adduction x10 BIL Hip ext/flex with knee straight R x 10 Hip circles with CCW and CW   Water step ups on submerged step 10 x R and L  SL step in water without aquatic flotation to work on balance with turbulence in water  SL stand and kicking for balance without UE support Squats x 20    Bad Ragaz, Pt with lumbar belt around hips and nek doodle for neck support.  PT at torso and assisting with trunk left to right and vice versa to engage trunk muscles. PT then rotated trunk in order to engage abdominal (internal and external obliques) Emphasis on breathing techniques to draw in abdominals for support.  Pt then utilizing posterior chain and engaging Hip extension and knee flexion with water resistance . LAD over BIL LE.     Pt requires the buoyancy of water for active assisted exercises with buoyancy supported for strengthening and AROM exercises. Hydrostatic pressure also supports joints by unweighting joint load by at least 50 % in 3-4 feet depth water. 80% in chest to neck deep water. Water will provide assistance with movement using the current and laminar flow while the buoyancy reduces weight bearing. Pt requires the viscosity of the water for resistance endurance  with strengthening exercises.         [facility] Adult PT Treatment:  DATE: 11-01-22 Therapeutic Exercise: STS 1 x 10 STS 2 x 10 with 6 lb weight  Step ups with 6 inch with CGA x 1  1 x 10 R and L Supine Pelvic Tilt   3 sets - 10 reps Supine SKTC  1 sets - 5 reps - 10 hold Supine Lower Trunk Rotation   5 x 20 sec R and L Manual Therapy: STW of  left QL and over  lumbar and sacral paraspinals Trigger Point Dry-Needling performed     by Garen Lah Treatment instructions: Expect mild to moderate muscle soreness. S/S of pneumothorax if dry needled over a lung field, and to seek immediate medical attention should they occur. Patient verbalized understanding of these instructions and education.  Patient Consent Given: Yes Education handout provided: Previously provided Muscles treated:  L  QL and  L lumbar/sacral paraspinals L5/s1 Electrical stimulation performed: No Parameters: N/A Treatment response/outcome: twitch response noted, pt noted relief  [facility] Adult PT Treatment:                                                DATE: 10-24-22 Pt enters building ambulating independently. Treatment took place in water 3.8 to  4 ft 8 in.feet deep depending upon activity.  Pt entered and exited the pool via stair and handrails independently. Pt initiated Rx with 7/10 pain. At end of session 4/10  Acclimating to water by walking back and forth and side stepping using rainbow DB in bil UE for security and balance in water Ms.  Lincecum was educated on  beneficial therapeutic effects of water while ambulating to acclimate to water walking forward, backward and side stepping.  Pt educated on neutral posture and hip hinging in seated position with water at chest level x 10 with stretch to low back and then x 10 with back at pool wall at external cue, VC for neck tucked to prevent hyperextension.  Also reinforced with STS on submerge bench and with submerged step with 90/90 hip for greater depth of squat. Aquatic Exercise:   Pt hip hinge using pool noodle and then gentle lumbar rotataion Thoracic AROM at edge of pool Hip abduction/adduction x10 BIL Hip ext/flex with knee straight R x 20   Standing back stretch with  UE on edge of pool  KB up and down press   Squats x 20 Heel and toe raises Toe yoga in pool Ambulating forward, backward and side stepping with CGA x 1  at beginning of session and then I with rainbow DB at end of session for 5 rounds of 30 feet. each      Pt requires the buoyancy of water for active assisted exercises with buoyancy supported for strengthening and AROM exercises. Hydrostatic pressure also supports joints by unweighting joint load by at least 50 % in 3-4 feet depth water. 80% in chest to neck deep water. Water will provide assistance with movement using the current and laminar flow while the buoyancy reduces weight bearing. Pt requires the viscosity of the water for resistance endurance  with strengthening exercises.  DATE: EVAL 10-23-22  issue HEP    PATIENT EDUCATION:  Education details: POC Explanation of findings, FOTO, issue HEP Person educated: Patient Education method: Explanation, Demonstration, Tactile cues, Verbal cues, and Handouts Education comprehension: verbalized understanding, returned demonstration, verbal cues required, tactile cues required, and needs further education  HOME EXERCISE PROGRAM: Access Code: [email or web address] URL: [email or web address] Date: 10/23/2022 Prepared by: Garen Lah  Exercises - Supine Pelvic Tilt  - 1 x daily - 7 x weekly - 3 sets - 10 reps - Supine Single Knee to Chest Stretch  - 2 x daily - 7 x weekly - 1 sets - 5 reps - 10 hold - Supine Lower Trunk Rotation  - 2 x daily - 7 x weekly - 1 sets - 5 reps - 20 hold - Sit to stand with sink support Movement snack  - 1 x daily - 7 x weekly - 3 sets - 10 reps Added 11-01-22 - Supine Bridge with Mini Swiss Ball Between Knees  - 1 x daily - 7 x weekly - 3 sets - 10 reps ASSESSMENT:  CLINICAL IMPRESSION: Session today focused  endurance, pain management and strengthening of LE. 6 MWT  642ft  ( norm 1290 ft - 1755 ft) improved from last week. FOTO improved to 44% past predicted 43%.  Pt is semi  compliant with exercise at home and fatigues easily.  Pt is discouraged that her pain has not greatly improved since surgery in June.  She was reminded that she has improved in her STS and 6 MWT but she now reports that she has tingling in her R LE that she would be better with surgery and since the aquatic pool therapy she has noted Left sided tingling in LE.  Pt was reminded to call MD if her symptoms worsen or if she has any unusual symptoms inability to control her bowels. Pt needs lots of encouragement and reminders of how she is improving in slow but significant ways.  Sessions will concentrate on progressive strengthening with remaining visits.  Pt left with all questions answered and no adverse effects post this session of TPDN being constantly monitored throughout session and with decreased muscle tension post TPDN. Will continue with POC and progressive strengthening.      EVAL- Patient is a 77  y.o. female who was seen today for physical therapy evaluation and treatment for continuing pain post surgeries (2) and most recent . Laminectomy Bil PLIF L5/S1 08-10-22, Pt has difficulty walking and remains sedentary due to pain. She has hx of fibromyalgia and bil neuropathy with complaints of pain in bil bottom of feet.  She reports she does UE exercises but she does not feel safe standing and walking. Ms Romig sleep is disturbed and she cannot sleep for longer than 2 hours at night.  Pt arrives at PT to try to get stronger and to improve her ability to move.  She seeks TPDN, HEP and aquatics to help her achieve her goals.  Pt also has some Positive signs of Left SI jt irritation. Pt will benefit from skilled PT to address her impairment and her immobility due to pain and weakness to return her to a more active lifestyle and continuance of healthy habits.  OBJECTIVE IMPAIRMENTS: decreased activity tolerance, decreased balance, decreased mobility, difficulty walking, decreased ROM, decreased strength,  postural dysfunction, obesity, and pain.   ACTIVITY LIMITATIONS: bending, standing, squatting, sleeping, stairs, transfers, bed mobility, and locomotion level  PARTICIPATION LIMITATIONS: meal prep, cleaning, laundry, shopping, and community activity  PERSONAL FACTORS: Spinal stenosis, neuropathy in bil feet, low kidney fx, Hypothyroidism HTN gout , Fibromyalgia, depression, OA. Osteopenia, lumbar fusion 2021, Laminectomy Bi PLIF L5/s1 08-10-22, carpal tunnel R 10-17-2018. Exploratory back surgery 07/23 are also affecting patient's functional outcome.   REHAB POTENTIAL:Good but challenging due to bil neuropathy and multiple back surgeries  CLINICAL DECISION MAKING: Evolving/moderate complexity  EVALUATION COMPLEXITY: Moderate   GOALS: Goals reviewed with patient? Yes  SHORT TERM GOALS: Target date: 11-20-22  Pt will be I with initial HEP Baseline:no knowledge Goal status:MET  2.  Pt pain will be decreased  by 25% Baseline: 9/10 at eval at worst. 11-12-22  at worst now still a 9/10 11-20-22  Pt 3/10 today  Goal status: MET  3.  Pt will be able to sleep at night for 3 or more hours of consecutive sleep without awakening due to pain Baseline: wake every 2-3 hours 11-12-22 Pt had bad weekend and waking every 2 hours and went back to sleep for 3 hours 11-20-22 my Right leg has pain I got up and did stretches and it helped  I sleep  3 hours Goal status:MET  4.  Pt will be able to perform 6 MWT without fatigueing Baseline:  fatigued after 2 MWT  99 ft 11-12-22 526.8 ft (91290 ft - 1755 ft) Goal status:MET  5.  Pt will be able to be educated in aquatic therapy for exercise in environment with decreased joint loading.  Baseline: No knowledge about aquatics 11-12-22  has been to aquatics x 2 Goal status:MET   LONG TERM GOALS: Target date: 12-18-22  Pt will be able to be I with advanced HEP Baseline:  Goal status: ONGOING  2.  Pt pain will decrease for functional activities  and  household chores by 50% Baseline: unable to stand for 5 min and cannot complete chores 11-22-22   Goal status: INITIAL  3.  Pt will be able to perform 5 x STS with 15 sec or less to show increased LE strength Baseline: 30.06 sec 11-20-22 5 x STS   18.47 ec Goal status: ONGOING  4.  will be able to lift 15# from the floor, not limited by pain, to allow completion of housework  Baseline: unable to lift items from floor Goal status: INITIAL  5.  Pt will report 4 or more hours of uninterrupted sleep due to pain Baseline: Awakes every 2-3 hours due to pain at night 11-20-22  3 hours Goal status:ONGOING  6.  FOTO will improve from  36%  to 43%    indicating improved functional mobility.  Baseline: EVAL 36% 11-20-22  44% Goal status: MET  PLAN:  PT FREQUENCY: 1-2x/week  PT DURATION: 8 weeks  PLANNED INTERVENTIONS: Therapeutic exercises, Therapeutic activity, Neuromuscular re-education, Balance training, Gait training, Patient/Family education, Self Care, Joint mobilization, Stair training, DME instructions, Aquatic Therapy, Dry Needling, Electrical stimulation, Spinal mobilization, Cryotherapy, Moist heat, Taping, Manual therapy, and Re-evaluation.  PLAN FOR NEXT SESSION: TPDN. Aquatics  HEP  Left SI RX  Garen Lah, PT, ATRIC Certified Exercise Expert for the Aging Adult  11/22/22 12:45 PM Phone: [phone] Fax: [phone]

## 2022-11-26 NOTE — Therapy (Signed)
OUTPATIENT PHYSICAL THERAPY THORACOLUMBAR TREATMENT   Patient Name: AVERILL GORRA MRN: [id number] DOB:06-03-1945, 77 y.o., female Today's Date: 11/27/2022  END OF SESSION:  PT End of Session - 11/27/22 1147     Visit Number 8    Number of Visits 16    Date for PT Re-Evaluation 12/18/22    Authorization Type [employer] MCR    PT Start Time 1146    PT Stop Time 1231    PT Time Calculation (min) 45 min    Activity Tolerance Patient limited by fatigue;Patient tolerated treatment well    Behavior During Therapy [facility] for tasks assessed/performed                   Past Medical History:  Diagnosis Date   Anxiety    Arthritis    Depression    Fatty liver    Fibromyalgia    Gout 04/2017   Hyperlipidemia    Hypertension    Hypothyroidism    pt no longer takes meds- just monitoring thyroid levels   Low kidney function    followed by Dr Nicholos Johns   Neuropathy    in both feet   Spinal stenosis    Past Surgical History:  Procedure Laterality Date   BACK SURGERY  09/13/2021   Carpel Tunnel Surgery Right 10/17/2018   both wrists   CATARACT EXTRACTION W/ INTRAOCULAR LENS IMPLANT Bilateral 10/25/2015   Right eye was first.  Left eye done 10/17.   CERVICAL BIOPSY  W/ LOOP ELECTRODE EXCISION  12/2008   hx CIN II   COLONOSCOPY     LAMINECTOMY WITH POSTERIOR LATERAL ARTHRODESIS LEVEL 1 Bilateral 08/10/2022   Procedure: Reexploration of Fusion Lumbar Five- Sacral One with Redo Postero-Lateral Fusion Replacement of Hardware and Posterio Lateral Arthodesis;  Surgeon: Donalee Citrin, MD;  Location: [facility] OR;  Service: Neurosurgery;  Laterality: Bilateral;   LUMBAR FUSION  2021   RADIOLOGY WITH ANESTHESIA N/A 07/26/2020   Procedure: MRI WITH ANESTHESIA  LUMBAR WITH AND WITHOUT CONTRAST;  Surgeon: Radiologist, Medication, MD;  Location: [facility] OR;  Service: Radiology;  Laterality: N/A;   Patient Active Problem List   Diagnosis Date Noted   Pseudoarthrosis of lumbar spine 08/10/2022   Mixed  hyperlipidemia 10/20/2021   Palpitations 10/20/2021   Spinal stenosis of lumbar region 09/13/2021   Spondylolisthesis at L4-L5 level 01/13/2020   Pain in left knee 05/28/2017   Elevated lipids 12/30/2014   History of cervical dysplasia 12/30/2014   Essential hypertension 09/10/2013   Chest pain 08/28/2013    PCP: Darryll Capers, PA-C   REFERRING PROVIDER: Donalee Citrin, MD   REFERRING DIAG: S32.009K (ICD-10-CM) - Unspecified fracture of unspecified lumbar vertebra, subsequent encounter for fracture with nonunion  Lumbar Pseudoarthrosis  Rationale for Evaluation and Treatment: Rehabilitation  THERAPY DIAG:  Chronic bilateral low back pain with right-sided sciatica  Sacral back pain  Unsteadiness on feet  Muscle weakness (generalized)  ONSET DATE: 08-10-22 laminectomy  Bil PLIF L5 S1  SUBJECTIVE:  SUBJECTIVE STATEMENT:  Ms Repp enters clinic  with 4/10 pain and walking very slowly. I used the stretches at night that I can do in bed.   I stopped driving because I didn't feel safe. I did go out and celebrate the harvest moon and I had to come home and go to bed. I have not been feeling better since my surgery in June.  I think my numbness is worse in my right leg and I feel like my left leg is getting numb.  EVAL-I had back surgery again in June and pain is not getting better. I have had 3 back surgeries now.  I have trouble walking and in my bil legs but I have R pain down into the bottom of my whole R foot.. Left leg pain also down to my feet. A neurologist Dr Allena Katz told me I have neuropathy. I have had fibromyalgia for about 15 years. My right leg makes wake up with pain at night. I try to sleep with legs between my  legs and one against my back for supports.  I wake up every 2-3 hours at night.  I walk  up and down the hall.. I try to do Upper body exercise 2 x a week. I was a member of [employer]. I am unable to drive due to not being able to feel my legs  R worse than L  PERTINENT HISTORY:  Spinal stenosis, neuropathy in bil feet, low kidney fx, Hypothyroidism HTN gout , Fibromyalgia, depression, OA. Osteopenia, lumbar fusion 2021, Laminectomy Bi PLIF L5/s1 08-10-22, carpal tunnel R 10-17-2018. Exploratory back surgery 07/23  PAIN:  Are you having pain? Yes: NPRS scale: at rest after a pain pill 1/10  but when moving or decrease pain  6/10    at very worst it would be 8-9/10/10 Pain location: Back pain on left is 6/10 and at worst 8-9/10  and down R leg   Pain description: Left back pain is stabbing.  Constantly  and   R leg pain wakes up at night with stabbing pain but during the day it is tingling Aggravating factors: walking, sleeping at night, sometimes pacing at night helps  but any moving  sitting max 30 min depending on chair.   Standing 10-15 min.  Walking  5 minutes Relieving factors: pacing sometimes for a short time  PRECAUTIONS: None  RED FLAGS: Bowel or bladder incontinence: No   WEIGHT BEARING RESTRICTIONS: No  FALLS:  Has patient fallen in last 6 months? No but pt reports feeling unsteady and stumbles at times  LIVING ENVIRONMENT: Lives with: lives with their spouse Lives in: House/apartment Stairs: Yes: External: 3 steps; on right going up Has following equipment at home: Single point cane grab bars  a 2 wheeled walker  OCCUPATION: retired  Customer service manager  PLOF: Independent with household mobility with device  PATIENT GOALS:  TPDN and I would like to be stronger and not feel feeble  NEXT MD VISIT: TBD  OBJECTIVE:   DIAGNOSTIC FINDINGS:  CLINICAL DATA:  Lumbar region back pain radiating to the right leg. Previous lumbosacral surgeries.   EXAM: CT LUMBAR SPINE WITHOUT CONTRAST   TECHNIQUE: Multidetector CT imaging of the lumbar spine was performed  without intravenous contrast administration. Multiplanar CT image reconstructions were also generated.   RADIATION DOSE REDUCTION: This exam was performed according to the departmental dose-optimization program which includes automated exposure control, adjustment of the mA and/or kV according to patient size and/or use of iterative  reconstruction technique.   COMPARISON:  Radiography 09/05/2022.  CT 05/18/2022.   FINDINGS: Segmentation: 5 lumbar type vertebral bodies as numbered previously.   Alignment: Chronic fixed scoliotic curvature convex to the right. 2 mm degenerative retrolisthesis at L1-2 is unchanged.   Vertebrae: No new vertebral finding. See below for details at each level.   Paraspinal and other soft tissues: No significant finding.   Disc levels:   T11-12 and T12-L1: Normal   L1-2: Chronic degenerative retrolisthesis of 2 mm. Disc degeneration with bulging of the disc worse on the left. Facet degeneration and hypertrophy. Stenosis of the lateral recesses and foramina, left worse than right. Similar appearance to the study of March.   L2-3 and L3-4: Previous posterior decompression, diskectomy and fusion. Solid union with sufficient patency of the canal and foramina.   L4-5: Previous posterior decompression, diskectomy and fusion. Solid union with sufficient patency of the canal and foramina.   L5-S1: Previous posterior decompression, diskectomy and fusion procedure. Continued evidence of nonunion with nitrogen gas in the disc space and lucency around the S1 screws, left more than right. Central canal as well decompressed. There is foraminal narrowing on the right as seen previously that could possibly affect the L5 nerve.   IMPRESSION: 1. No change since the study of March of this year. Previous posterior decompression, diskectomy and fusion procedures from L2-3 through L4-5 with solid union and sufficient patency of the canal and foramina. 2.  Continued evidence of nonunion at the L5-S1 level with nitrogen gas in the disc space and lucency around the S1 screws, left more than right. No change in foraminal narrowing on the right at L5-S1. 3. L1-2 degenerative disc disease with 2 mm degenerative retrolisthesis. Stenosis of the lateral recesses and foramina, left worse than right. Similar appearance to the study of March.     Electronically Signed   By: Paulina Fusi M.D.   On: 10/07/2022 13:55  PATIENT SURVEYS:  FOTE  36%  predicted 43% 11-20-22  44% SCREENING FOR RED FLAGS: Bowel or bladder incontinence: No  COGNITION: Overall cognitive status: Within functional limits for tasks assessed     SENSATION: Pt with bil neuropathy and avoids driving car due to not being able to feel the brake pedal  MUSCLE LENGTH: Hamstrings:  WFL   POSTURE: rounded shoulders, forward head, and left pelvic level higher  Left anterior innominate rotation  PALPATION: Pt with Left SI sensitivity and + sacral compression and distraction test.  Pt with decreased pain with R LAD for R radiculopathy.  Pt with pain with palpation over Right Lumbar l-3 to S1 TTP over Left SI jt LUMBAR ROM:   AROM eval 11-20-22  Flexion Fingertips to floor Fingertips to floor  Extension 20% L low back 50% low back  Right lateral flexion Fingertips 2 inch below knee jt line pulling pain on left Fingertips 2 inch below knee jt line pulling pain on lef  Left lateral flexion Fingertips to knee jt line pain on L Fingertips 1 inch below knee jt line pulling pain on right  Right rotation 50% 60%  Left rotation 50% 60%   (Blank rows = not tested)  LOWER EXTREMITY ROM:   All WNL unless other wise noted  Active  Right eval Left eval R/L 11-27-22  Hip flexion Standing 55  Standing  60 88/86  Hip extension     Hip abduction     Hip adduction     Hip internal rotation 20    Hip external  rotation     Knee flexion     Knee extension     Ankle dorsiflexion     Ankle  plantarflexion     Ankle inversion     Ankle eversion      (Blank rows = not tested)  LOWER EXTREMITY MMT:    MMT Right eval Left eval R/L 11-20-22  Hip flexion 3- 4- 4-/4-  Hip extension 3- 4- 4-/4-  Hip abduction 3- 4- 4-/4-  Hip adduction     Hip internal rotation     Hip external rotation     Knee flexion 4 4 4/4+  Knee extension 4 4 4/4  Ankle dorsiflexion     Ankle plantarflexion     Ankle inversion     Ankle eversion      (Blank rows = not tested)  LUMBAR SPECIAL TESTS:  NT due to lumbar surgery in June 2024  FUNCTIONAL TESTS:  5 times sit to stand: 30.06 sec   43ft with SPC 11-12-22 526.8 ft (1290 ft - 1755 ft) 11-22-22  6 MWT  628ft  ( norm 1290 ft - 1755 ft) 11-20-22 5 x STS   18.47 ec  GAIT: Distance walked: 150 feet  then 99 ft for Assistive device utilized: Single point cane Level of assistance: Modified independence Comments: Pt walks cautiously and very slowly and unsteady , not confident with gait           TODAY'S TREATMENT:  [facility] Adult PT Treatment:                                                DATE: 11-27-22 Therapeutic Exercise: Recumbent bike 7 min  level 2 Clamshell with Resistance  with RTB 3  - 10 reps STS with 15 # KB  1 x 10  then 25 #  2 x 5 with Close supervision by PT TA engagement x 10 Bridge with ball   1 x 10 Hip Abduction with RTB  2 x 10 R and L Hip Extension with RTB 2 x  10 R and L Forward Step Up with Counter Support 6 inch step 1 x 10  Self Care: Community resources for exercise post DC. Pt will join in next 3 weeks  Self care and prioritizing strengthening on land vs  water for muscle hypertrophy  [facility] Adult PT Treatment:                                                DATE: 11-22-22 Therapeutic Exercise: 6 MWT  651ft  ( norm 1290 ft - 1755 ft) Prone hip extension 2 x 10 on R and L   L and R sidelying  2 x 10 hip abduction Bridging with pillow squeeze  4 sets of 5 to reduce hamstring cramping Manual  Therapy: STW of  BIl QL and over lumbar and sacral paraspinals Trigger Point Dry-Needling performed     by Garen Lah Treatment instructions: Expect mild to moderate muscle soreness. S/S of pneumothorax if dry needled over a lung field, and to seek immediate medical attention should they occur. Patient verbalized understanding of these instructions and education.  Patient Consent Given: Yes Education handout provided: Previously provided Muscles treated:  Bil  QL and  Bil lumbar/sacral paraspinals. Bil Gluteals Electrical stimulation performed: No Parameters: N/A Treatment response/outcome: twitch response noted, pt noted relief  [facility] Adult PT Treatment:                                                DATE: 11-20-22 Therapeutic Exercise: STS with 10 lb  3 x 10 Knee ext Omega  2 x 10 bil Knee flexion Omega 2 x 10 Reverse lunge with UE on chair back and counter 1 x 10 on R and L Practicing hip hinge  against wall and with chair for safety  without making contact for tempo squats Deadlift  with 10 lb x 5 on 8 inch elevated box Hip hinge with back to 8 inch elevated box and VC and TC for proper hip hinge form Self Care: Sleep hygiene Lifting principles Deadlifting and protection of back  Endurance exercise education with plan for 15 min timed period [facility] Adult PT Treatment:                                                DATE: 11-12-22 Therapeutic Exercise: Supine Pelvic Tilt   2 sets - 10 reps Supine SKTC  1 sets - 5 reps - 10 hold Supine Lower Trunk Rotation   5 x 20 sec R and L 526.8 ft (91290 ft - 1755 ft) Manual Therapy: STW of  left QL and over lumbar and sacral paraspinals Trigger Point Dry-Needling performed     by Garen Lah Treatment instructions: Expect mild to moderate muscle soreness. S/S of pneumothorax if dry needled over a lung field, and to seek immediate medical attention should they occur. Patient verbalized understanding of these instructions and  education.  Patient Consent Given: Yes Education handout provided: Previously provided Muscles treated:  L  QL and  L lumbar/sacral paraspinals. L Gluteals Electrical stimulation performed: No Parameters: N/A Treatment response/outcome: twitch response noted, pt noted relief [facility] Adult PT Treatment:                                                DATE: 11-07-22 Pt enters building ambulating independently using a SPC to ambulate. Treatment took place in water 3.8 to  4 ft 8 in.feet deep depending upon activity.  Pt entered and exited the pool via stair and handrails independently. Pt initiated Rx with 6/10 pain with increased new pain down my left side and it just started last night. At end of session 4/10 Acclimating to water by walking back and forth and side stepping using rainbow DB in bil UE for security and balance in water Aquatic Exercise:   Pt hip hinge using pool noodle  Gentle lumbar rotation with pool noodle yellow Thoracic AROM at edge of pool Kickboard side to side resistance Hip abduction/adduction x10 BIL Hip ext/flex with knee straight R x 10 Hip circles with CCW and CW   Water step ups on submerged step 10 x R and L  SL step in water without aquatic flotation to work on balance with turbulence in water  SL stand  and kicking for balance without UE support Squats x 20    Bad Ragaz, Pt with lumbar belt around hips and nek doodle for neck support.  PT at torso and assisting with trunk left to right and vice versa to engage trunk muscles. PT then rotated trunk in order to engage abdominal (internal and external obliques) Emphasis on breathing techniques to draw in abdominals for support.  Pt then utilizing posterior chain and engaging Hip extension and knee flexion with water resistance . LAD over BIL LE.     Pt requires the buoyancy of water for active assisted exercises with buoyancy supported for strengthening and AROM exercises. Hydrostatic pressure also supports joints by  unweighting joint load by at least 50 % in 3-4 feet depth water. 80% in chest to neck deep water. Water will provide assistance with movement using the current and laminar flow while the buoyancy reduces weight bearing. Pt requires the viscosity of the water for resistance endurance  with strengthening exercises.         [facility] Adult PT Treatment:                                                DATE: 11-01-22 Therapeutic Exercise: STS 1 x 10 STS 2 x 10 with 6 lb weight  Step ups with 6 inch with CGA x 1  1 x 10 R and L Supine Pelvic Tilt   3 sets - 10 reps Supine SKTC  1 sets - 5 reps - 10 hold Supine Lower Trunk Rotation   5 x 20 sec R and L Manual Therapy: STW of  left QL and over lumbar and sacral paraspinals Trigger Point Dry-Needling performed     by Garen Lah Treatment instructions: Expect mild to moderate muscle soreness. S/S of pneumothorax if dry needled over a lung field, and to seek immediate medical attention should they occur. Patient verbalized understanding of these instructions and education.  Patient Consent Given: Yes Education handout provided: Previously provided Muscles treated:  L  QL and  L lumbar/sacral paraspinals L5/s1 Electrical stimulation performed: No Parameters: N/A Treatment response/outcome: twitch response noted, pt noted relief  [facility] Adult PT Treatment:                                                DATE: 10-24-22 Pt enters building ambulating independently. Treatment took place in water 3.8 to  4 ft 8 in.feet deep depending upon activity.  Pt entered and exited the pool via stair and handrails independently. Pt initiated Rx with 7/10 pain. At end of session 4/10  Acclimating to water by walking back and forth and side stepping using rainbow DB in bil UE for security and balance in water Ms.  Kano was educated on  beneficial therapeutic effects of water while ambulating to acclimate to water walking forward, backward and side stepping.  Pt  educated on neutral posture and hip hinging in seated position with water at chest level x 10 with stretch to low back and then x 10 with back at pool wall at external cue, VC for neck tucked to prevent hyperextension.  Also reinforced with STS on submerge bench and with submerged step with 90/90 hip for greater depth of  squat. Aquatic Exercise:   Pt hip hinge using pool noodle and then gentle lumbar rotataion Thoracic AROM at edge of pool Hip abduction/adduction x10 BIL Hip ext/flex with knee straight R x 20   Standing back stretch with  UE on edge of pool  KB up and down press   Squats x 20 Heel and toe raises Toe yoga in pool Ambulating forward, backward and side stepping with CGA x 1 at beginning of session and then I with rainbow DB at end of session for 5 rounds of 30 feet. each      Pt requires the buoyancy of water for active assisted exercises with buoyancy supported for strengthening and AROM exercises. Hydrostatic pressure also supports joints by unweighting joint load by at least 50 % in 3-4 feet depth water. 80% in chest to neck deep water. Water will provide assistance with movement using the current and laminar flow while the buoyancy reduces weight bearing. Pt requires the viscosity of the water for resistance endurance  with strengthening exercises.                                                                                                                              DATE: EVAL 10-23-22  issue HEP    PATIENT EDUCATION:  Education details: POC Explanation of findings, FOTO, issue HEP Person educated: Patient Education method: Explanation, Demonstration, Tactile cues, Verbal cues, and Handouts Education comprehension: verbalized understanding, returned demonstration, verbal cues required, tactile cues required, and needs further education  HOME EXERCISE PROGRAM: Access Code: [email or web address] URL: [email or web address] Date: 10/23/2022 Prepared by: Garen Lah  Exercises - Supine Pelvic Tilt  - 1 x daily - 7 x weekly - 3 sets - 10 reps - Supine Single Knee to Chest Stretch  - 2 x daily - 7 x weekly - 1 sets - 5 reps - 10 hold - Supine Lower Trunk Rotation  - 2 x daily - 7 x weekly - 1 sets - 5 reps - 20 hold - Sit to stand with sink support Movement snack  - 1 x daily - 7 x weekly - 3 sets - 10 reps Added 11-01-22  - Clamshell with Resistance  - 1 x daily - 7 x weekly - 3 sets - 10 reps - Hip Abduction with Resistance Loop  - 1 x daily - 7 x weekly - 3 sets - 10 reps - Hip Extension with Resistance Loop  - 1 x daily - 7 x weekly - 3 sets - 10 reps - Forward Step Up with Counter Support  - 1 x daily - 7 x weekly - 3 sets - 10 reps ASSESSMENT:  CLINICAL IMPRESSION: Session today focused  progressing.strengthening in closed chain and also more challenging supine exercises. Pt with HEP but not challenging herself for increased muscle strength for updating HEP.   Pt is semi compliant with exercise at home and fatigues easily.  Pt  continues to be discouraged that  her pain has not greatly improved since surgery in June.  Self care time concentrated on local community wellness and necessity to use land and aquatics for exercise for pain control but also to challenge for muscle hypertrophy.  Pt before next appt was challenged to join local community wellness and remainder of appt.with reinforce HEP  Pt left with all questions answered and no adverse effects post this session of TPDN being constantly monitored throughout session and with decreased muscle tension post TPDN. Will continue with POC and progressive strengthening.      EVAL- Patient is a 77  y.o. female who was seen today for physical therapy evaluation and treatment for continuing pain post surgeries (2) and most recent . Laminectomy Bil PLIF L5/S1 08-10-22, Pt has difficulty walking and remains sedentary due to pain. She has hx of fibromyalgia and bil neuropathy with complaints of pain in  bil bottom of feet.  She reports she does UE exercises but she does not feel safe standing and walking. Ms [place]beck sleep is disturbed and she cannot sleep for longer than 2 hours at night.  Pt arrives at PT to try to get stronger and to improve her ability to move.  She seeks TPDN, HEP and aquatics to help her achieve her goals.  Pt also has some Positive signs of Left SI jt irritation. Pt will benefit from skilled PT to address her impairment and her immobility due to pain and weakness to return her to a more active lifestyle and continuance of healthy habits.  OBJECTIVE IMPAIRMENTS: decreased activity tolerance, decreased balance, decreased mobility, difficulty walking, decreased ROM, decreased strength, postural dysfunction, obesity, and pain.   ACTIVITY LIMITATIONS: bending, standing, squatting, sleeping, stairs, transfers, bed mobility, and locomotion level  PARTICIPATION LIMITATIONS: meal prep, cleaning, laundry, shopping, and community activity  PERSONAL FACTORS: Spinal stenosis, neuropathy in bil feet, low kidney fx, Hypothyroidism HTN gout , Fibromyalgia, depression, OA. Osteopenia, lumbar fusion 2021, Laminectomy Bi PLIF L5/s1 08-10-22, carpal tunnel R 10-17-2018. Exploratory back surgery 07/23 are also affecting patient's functional outcome.   REHAB POTENTIAL:Good but challenging due to bil neuropathy and multiple back surgeries  CLINICAL DECISION MAKING: Evolving/moderate complexity  EVALUATION COMPLEXITY: Moderate   GOALS: Goals reviewed with patient? Yes  SHORT TERM GOALS: Target date: 11-20-22  Pt will be I with initial HEP Baseline:no knowledge Goal status:MET  2.  Pt pain will be decreased  by 25% Baseline: 9/10 at eval at worst. 11-12-22  at worst now still a 9/10 11-20-22  Pt 3/10 today  Goal status: MET  3.  Pt will be able to sleep at night for 3 or more hours of consecutive sleep without awakening due to pain Baseline: wake every 2-3 hours 11-12-22 Pt had bad weekend  and waking every 2 hours and went back to sleep for 3 hours 11-20-22 my Right leg has pain I got up and did stretches and it helped  I sleep  3 hours Goal status:MET  4.  Pt will be able to perform 6 MWT without fatigueing Baseline:  fatigued after 2 MWT  99 ft 11-12-22 526.8 ft (91290 ft - 1755 ft) Goal status:MET  5.  Pt will be able to be educated in aquatic therapy for exercise in environment with decreased joint loading.  Baseline: No knowledge about aquatics 11-12-22  has been to aquatics x 2 Goal status:MET   LONG TERM GOALS: Target date: 12-18-22  Pt will be able to be I with advanced HEP Baseline:  Goal status:  ONGOING  2.  Pt pain will decrease for functional activities  and household chores by 50% Baseline: unable to stand for 5 min and cannot complete chores 11-22-22   Goal status: INITIAL  3.  Pt will be able to perform 5 x STS with 15 sec or less to show increased LE strength Baseline: 30.06 sec 11-20-22 5 x STS   18.47 ec Goal status: ONGOING  4.  will be able to lift 15# from the floor, not limited by pain, to allow completion of housework  Baseline: unable to lift items from floor 11-27-22   using 15# for STS and 25# for STS for 5x to progress Goal status:ONGOING  5.  Pt will report 4 or more hours of uninterrupted sleep due to pain Baseline: Awakes every 2-3 hours due to pain at night 11-20-22  3 hours Goal status:ONGOING  6.  FOTO will improve from  36%  to 43%    indicating improved functional mobility.  Baseline: EVAL 36% 11-20-22  44% Goal status: MET  PLAN:  PT FREQUENCY: 1-2x/week  PT DURATION: 8 weeks  PLANNED INTERVENTIONS: Therapeutic exercises, Therapeutic activity, Neuromuscular re-education, Balance training, Gait training, Patient/Family education, Self Care, Joint mobilization, Stair training, DME instructions, Aquatic Therapy, Dry Needling, Electrical stimulation, Spinal mobilization, Cryotherapy, Moist heat, Taping, Manual therapy, and  Re-evaluation.  PLAN FOR NEXT SESSION: TPDN. Aquatics  HEP  Left SI RX  Garen Lah, PT, [facility] Certified Exercise Expert for the Aging Adult  11/27/22 1:04 PM Phone: [phone] Fax: [phone]

## 2022-11-27 ENCOUNTER — Ambulatory Visit: Payer: Medicare HMO | Admitting: Physical Therapy

## 2022-11-27 ENCOUNTER — Encounter: Payer: Self-pay | Admitting: Physical Therapy

## 2022-11-27 DIAGNOSIS — R2681 Unsteadiness on feet: Secondary | ICD-10-CM

## 2022-11-27 DIAGNOSIS — M6281 Muscle weakness (generalized): Secondary | ICD-10-CM

## 2022-11-27 DIAGNOSIS — M5441 Lumbago with sciatica, right side: Secondary | ICD-10-CM | POA: Diagnosis not present

## 2022-11-27 DIAGNOSIS — M533 Sacrococcygeal disorders, not elsewhere classified: Secondary | ICD-10-CM

## 2022-11-27 DIAGNOSIS — G8929 Other chronic pain: Secondary | ICD-10-CM

## 2022-11-29 ENCOUNTER — Ambulatory Visit: Payer: Medicare HMO | Admitting: Physical Therapy

## 2022-12-04 ENCOUNTER — Ambulatory Visit: Payer: Medicare HMO | Admitting: Physical Therapy

## 2022-12-06 ENCOUNTER — Encounter: Payer: Self-pay | Admitting: Physical Therapy

## 2022-12-06 ENCOUNTER — Ambulatory Visit: Payer: Medicare HMO | Attending: Neurosurgery | Admitting: Physical Therapy

## 2022-12-06 DIAGNOSIS — M6281 Muscle weakness (generalized): Secondary | ICD-10-CM

## 2022-12-06 DIAGNOSIS — R2681 Unsteadiness on feet: Secondary | ICD-10-CM | POA: Diagnosis present

## 2022-12-06 DIAGNOSIS — M5441 Lumbago with sciatica, right side: Secondary | ICD-10-CM | POA: Insufficient documentation

## 2022-12-06 DIAGNOSIS — G8929 Other chronic pain: Secondary | ICD-10-CM | POA: Diagnosis present

## 2022-12-06 DIAGNOSIS — M533 Sacrococcygeal disorders, not elsewhere classified: Secondary | ICD-10-CM | POA: Diagnosis present

## 2022-12-06 NOTE — Patient Instructions (Signed)
WALKING  Walking is a great form of exercise to increase your strength, endurance and overall fitness.  A walking program can help you start slowly and gradually build endurance as you go.  Everyone's ability is different, so each person's starting point will be different.  You do not have to follow them exactly.  The are just samples. You should simply find out what's right for you and stick to that program.   In the beginning, you'll start off walking 2-3 times a day for short distances.  As you get stronger, you'll be walking further at just 1-2 times per day. Najai  you can begin to do 10 min continuous walking 2 -3 times a day and then move to 15 min 2 x a day  A. You Can Walk For A Certain Length Of Time Each Day    Walk 5 minutes 3 times per day.  Increase 2 minutes every 2 days (3 times per day).  Work up to 25-30 minutes (1-2 times per day).   Example:   Day 1-2 5 minutes 3 times per day   Day 7-8 12 minutes 2-3 times per day   Day 13-14 25 minutes 1-2 times per day  B. You Can Walk For a Certain Distance Each Day     Distance can be substituted for time.    Example:   3 trips to mailbox (at road)   3 trips to corner of block   3 trips around the block  C. Go to local [employer] and use the track.    Walk for distance ____ around track  Or time ____ minutes  D. Walk __x__ Jog ____ Run ___  Please only do the exercises that your therapist has initialed and dated   Garen Lah, PT, [facility] Certified Exercise Expert for the Aging Adult  12/06/22 11:32 AM Phone: [phone] Fax: [phone]

## 2022-12-06 NOTE — Therapy (Signed)
OUTPATIENT PHYSICAL THERAPY THORACOLUMBAR TREATMENT   Patient Name: Patricia Parks MRN: [id number] DOB:[birth year], 77 y.o., female Today's Date: 12/06/2022  END OF SESSION:  PT End of Session - 12/06/22 1103     Visit Number 9    Number of Visits 16    Date for PT Re-Evaluation 12/18/22    Authorization Type [employer] MCR    Progress Note Due on Visit 10    PT Start Time 1103    PT Stop Time 1145    PT Time Calculation (min) 42 min    Activity Tolerance Patient limited by fatigue;Patient tolerated treatment well    Behavior During Therapy [facility] for tasks assessed/performed                    Past Medical History:  Diagnosis Date   Anxiety    Arthritis    Depression    Fatty liver    Fibromyalgia    Gout 04/2017   Hyperlipidemia    Hypertension    Hypothyroidism    pt no longer takes meds- just monitoring thyroid levels   Low kidney function    followed by Dr Nicholos Johns   Neuropathy    in both feet   Spinal stenosis    Past Surgical History:  Procedure Laterality Date   BACK SURGERY  09/13/2021   Carpel Tunnel Surgery Right 10/17/2018   both wrists   CATARACT EXTRACTION W/ INTRAOCULAR LENS IMPLANT Bilateral 10/25/2015   Right eye was first.  Left eye done 10/17.   CERVICAL BIOPSY  W/ LOOP ELECTRODE EXCISION  12/2008   hx CIN II   COLONOSCOPY     LAMINECTOMY WITH POSTERIOR LATERAL ARTHRODESIS LEVEL 1 Bilateral 08/10/2022   Procedure: Reexploration of Fusion Lumbar Five- Sacral One with Redo Postero-Lateral Fusion Replacement of Hardware and Posterio Lateral Arthodesis;  Surgeon: Donalee Citrin, MD;  Location: [facility] OR;  Service: Neurosurgery;  Laterality: Bilateral;   LUMBAR FUSION  2021   RADIOLOGY WITH ANESTHESIA N/A 07/26/2020   Procedure: MRI WITH ANESTHESIA  LUMBAR WITH AND WITHOUT CONTRAST;  Surgeon: Radiologist, Medication, MD;  Location: [facility] OR;  Service: Radiology;  Laterality: N/A;   Patient Active Problem List   Diagnosis Date Noted   Pseudoarthrosis of  lumbar spine 08/10/2022   Mixed hyperlipidemia 10/20/2021   Palpitations 10/20/2021   Spinal stenosis of lumbar region 09/13/2021   Spondylolisthesis at L4-L5 level 01/13/2020   Pain in left knee 05/28/2017   Elevated lipids 12/30/2014   History of cervical dysplasia 12/30/2014   Essential hypertension 09/10/2013   Chest pain 08/28/2013    PCP: Darryll Capers, PA-C   REFERRING PROVIDER: Donalee Citrin, MD   REFERRING DIAG: S32.009K (ICD-10-CM) - Unspecified fracture of unspecified lumbar vertebra, subsequent encounter for fracture with nonunion  Lumbar Pseudoarthrosis  Rationale for Evaluation and Treatment: Rehabilitation  THERAPY DIAG:  Chronic bilateral low back pain with right-sided sciatica  Sacral back pain  Unsteadiness on feet  Muscle weakness (generalized)  ONSET DATE: 08-10-22 laminectomy  Bil PLIF L5 S1  SUBJECTIVE:  SUBJECTIVE STATEMENT:  [name] enters clinic  with 4/10 pain, but the night before was very painful with 6-710 and I usually walked up and down in the hallway and sometimes I use the stretches.. I used the stretches at night that I can do in bed.   I stopped driving because I didn't feel safe. I did go out and celebrate the harvest moon and I had to come home and go to bed. I have not been feeling better since my surgery in June.  I think my numbness is worse in my right leg and I feel like my left leg is getting numb.  EVAL-I had back surgery again in June and pain is not getting better. I have had 3 back surgeries now.  I have trouble walking and in my bil legs but I have R pain down into the bottom of my whole R foot.. Left leg pain also down to my feet. A neurologist Dr Allena Katz told me I have neuropathy. I have had fibromyalgia for about 15 years. My right leg makes wake up  with pain at night. I try to sleep with legs between my  legs and one against my back for supports.  I wake up every 2-3 hours at night.  I walk up and down the hall.. I try to do Upper body exercise 2 x a week. I was a member of [employer]. I am unable to drive due to not being able to feel my legs  R worse than L  PERTINENT HISTORY:  Spinal stenosis, neuropathy in bil feet, low kidney fx, Hypothyroidism HTN gout , Fibromyalgia, depression, OA. Osteopenia, lumbar fusion 2021, Laminectomy Bi PLIF L5/s1 08-10-22, carpal tunnel R 10-17-2018. Exploratory back surgery 07/23  PAIN:  Are you having pain? Yes: NPRS scale: at rest after a pain pill 1/10  but when moving or decrease pain  6/10    at very worst it would be 8-9/10/10 Pain location: Back pain on left is 6/10 and at worst 8-9/10  and down R leg   Pain description: Left back pain is stabbing.  Constantly  and   R leg pain wakes up at night with stabbing pain but during the day it is tingling Aggravating factors: walking, sleeping at night, sometimes pacing at night helps  but any moving  sitting max 30 min depending on chair.   Standing 10-15 min.  Walking  5 minutes Relieving factors: pacing sometimes for a short time  PRECAUTIONS: None  RED FLAGS: Bowel or bladder incontinence: No   WEIGHT BEARING RESTRICTIONS: No  FALLS:  Has patient fallen in last 6 months? No but pt reports feeling unsteady and stumbles at times  LIVING ENVIRONMENT: Lives with: lives with their spouse Lives in: House/apartment Stairs: Yes: External: 3 steps; on right going up Has following equipment at home: Single point cane grab bars  a 2 wheeled walker  OCCUPATION: retired  Customer service manager  PLOF: Independent with household mobility with device  PATIENT GOALS:  TPDN and I would like to be stronger and not feel feeble  NEXT MD VISIT: TBD  OBJECTIVE:   DIAGNOSTIC FINDINGS:  CLINICAL DATA:  Lumbar region back pain radiating to the right leg. Previous  lumbosacral surgeries.   EXAM: CT LUMBAR SPINE WITHOUT CONTRAST   TECHNIQUE: Multidetector CT imaging of the lumbar spine was performed without intravenous contrast administration. Multiplanar CT image reconstructions were also generated.   RADIATION DOSE REDUCTION: This exam was performed according to the departmental  dose-optimization program which includes automated exposure control, adjustment of the mA and/or kV according to patient size and/or use of iterative reconstruction technique.   COMPARISON:  Radiography 09/05/2022.  CT 05/18/2022.   FINDINGS: Segmentation: 5 lumbar type vertebral bodies as numbered previously.   Alignment: Chronic fixed scoliotic curvature convex to the right. 2 mm degenerative retrolisthesis at L1-2 is unchanged.   Vertebrae: No new vertebral finding. See below for details at each level.   Paraspinal and other soft tissues: No significant finding.   Disc levels:   T11-12 and T12-L1: Normal   L1-2: Chronic degenerative retrolisthesis of 2 mm. Disc degeneration with bulging of the disc worse on the left. Facet degeneration and hypertrophy. Stenosis of the lateral recesses and foramina, left worse than right. Similar appearance to the study of March.   L2-3 and L3-4: Previous posterior decompression, diskectomy and fusion. Solid union with sufficient patency of the canal and foramina.   L4-5: Previous posterior decompression, diskectomy and fusion. Solid union with sufficient patency of the canal and foramina.   L5-S1: Previous posterior decompression, diskectomy and fusion procedure. Continued evidence of nonunion with nitrogen gas in the disc space and lucency around the S1 screws, left more than right. Central canal as well decompressed. There is foraminal narrowing on the right as seen previously that could possibly affect the L5 nerve.   IMPRESSION: 1. No change since the study of March of this year. Previous posterior  decompression, diskectomy and fusion procedures from L2-3 through L4-5 with solid union and sufficient patency of the canal and foramina. 2. Continued evidence of nonunion at the L5-S1 level with nitrogen gas in the disc space and lucency around the S1 screws, left more than right. No change in foraminal narrowing on the right at L5-S1. 3. L1-2 degenerative disc disease with 2 mm degenerative retrolisthesis. Stenosis of the lateral recesses and foramina, left worse than right. Similar appearance to the study of March.     Electronically Signed   By: Paulina Fusi M.D.   On: 10/07/2022 13:55  PATIENT SURVEYS:  FOTE  36%  predicted 43% 11-20-22  44% SCREENING FOR RED FLAGS: Bowel or bladder incontinence: No  COGNITION: Overall cognitive status: Within functional limits for tasks assessed     SENSATION: Pt with bil neuropathy and avoids driving car due to not being able to feel the brake pedal  MUSCLE LENGTH: Hamstrings:  WFL   POSTURE: rounded shoulders, forward head, and left pelvic level higher  Left anterior innominate rotation  PALPATION: Pt with Left SI sensitivity and + sacral compression and distraction test.  Pt with decreased pain with R LAD for R radiculopathy.  Pt with pain with palpation over Right Lumbar l-3 to S1 TTP over Left SI jt LUMBAR ROM:   AROM eval 11-20-22  Flexion Fingertips to floor Fingertips to floor  Extension 20% L low back 50% low back  Right lateral flexion Fingertips 2 inch below knee jt line pulling pain on left Fingertips 2 inch below knee jt line pulling pain on lef  Left lateral flexion Fingertips to knee jt line pain on L Fingertips 1 inch below knee jt line pulling pain on right  Right rotation 50% 60%  Left rotation 50% 60%   (Blank rows = not tested)  LOWER EXTREMITY ROM:   All WNL unless other wise noted  Active  Right eval Left eval R/L 11-27-22  Hip flexion Standing 55  Standing  60 88/86  Hip extension  Hip abduction      Hip adduction     Hip internal rotation 20    Hip external rotation     Knee flexion     Knee extension     Ankle dorsiflexion     Ankle plantarflexion     Ankle inversion     Ankle eversion      (Blank rows = not tested)  LOWER EXTREMITY MMT:    MMT Right eval Left eval R/L 11-20-22  Hip flexion 3- 4- 4-/4-  Hip extension 3- 4- 4-/4-  Hip abduction 3- 4- 4-/4-  Hip adduction     Hip internal rotation     Hip external rotation     Knee flexion 4 4 4/4+  Knee extension 4 4 4/4  Ankle dorsiflexion     Ankle plantarflexion     Ankle inversion     Ankle eversion      (Blank rows = not tested)  LUMBAR SPECIAL TESTS:  NT due to lumbar surgery in June 2024  FUNCTIONAL TESTS:  5 times sit to stand: 30.06 sec   52ft with SPC 11-12-22 526.8 ft (1290 ft - 1755 ft) 11-22-22  6 MWT  649ft  ( norm 1290 ft - 1755 ft) 11-20-22 5 x STS   18.47 ec  GAIT: Distance walked: 150 feet  then 99 ft for Assistive device utilized: Single point cane Level of assistance: Modified independence Comments: Pt walks cautiously and very slowly and unsteady , not confident with gait           TODAY'S TREATMENT:   [facility] Adult PT Treatment:                                                DATE: 12-06-22 Therapeutic Exercise: STS with no UE support 10 x STS with 15 # KB 2 x 10 Bridge with ball   4 sets of  5 Hip Abduction with RTB  2 x 10 R and L Hip Extension with RTB 2 x  10 R and L Table top marching 10 Single limb cone tapping with Supervision for safety  Manual Therapy: STW of  BIl QL and over lumbar and sacral paraspinals Use of tennis ball over left piriformis  Left iliacus MET for Left SI Trigger Point Dry-Needling performed     by Garen Lah Treatment instructions: Expect mild to moderate muscle soreness. S/S of pneumothorax if dry needled over a lung field, and to seek immediate medical attention should they occur. Patient verbalized understanding of these  instructions and education.  Patient Consent Given: Yes Education handout provided: Previously provided Muscles treated:  Bil  QL and  Bil lumbar/sacral paraspinals. Bil Gluteals and Left piriformis Electrical stimulation performed: No Parameters: N/A Treatment response/outcome: twitch response noted, pt noted relief  Self Care: Walking program Explanation for need for endurance with walking  10 min 2 - 3 x a day to decreased sedentary behavior [facility] Adult PT Treatment:                                                DATE: 11-27-22 Therapeutic Exercise: Recumbent bike 7 min  level 2 Clamshell with Resistance  with RTB 3  -  10 reps STS with 15 # KB  1 x 10  then 25 #  2 x 5 with Close supervision by PT TA engagement x 10 Bridge with ball   1 x 10 Hip Abduction with RTB  2 x 10 R and L Hip Extension with RTB 2 x  10 R and L Forward Step Up with Counter Support 6 inch step 1 x 10  Self Care: Community resources for exercise post DC. Pt will join in next 3 weeks  Self care and prioritizing strengthening on land vs  water for muscle hypertrophy  [facility] Adult PT Treatment:                                                DATE: 11-22-22 Therapeutic Exercise: 6 MWT  644ft  ( norm 1290 ft - 1755 ft) Prone hip extension 2 x 10 on R and L   L and R sidelying  2 x 10 hip abduction Bridging with pillow squeeze  4 sets of 5 to reduce hamstring cramping Manual Therapy: STW of  BIl QL and over lumbar and sacral paraspinals Trigger Point Dry-Needling performed     by Garen Lah Treatment instructions: Expect mild to moderate muscle soreness. S/S of pneumothorax if dry needled over a lung field, and to seek immediate medical attention should they occur. Patient verbalized understanding of these instructions and education.  Patient Consent Given: Yes Education handout provided: Previously provided Muscles treated:  Bil  QL and  Bil lumbar/sacral paraspinals. Bil Gluteals Electrical stimulation  performed: No Parameters: N/A Treatment response/outcome: twitch response noted, pt noted relief  [facility] Adult PT Treatment:                                                DATE: 11-20-22 Therapeutic Exercise: STS with 10 lb  3 x 10 Knee ext Omega  2 x 10 bil Knee flexion Omega 2 x 10 Reverse lunge with UE on chair back and counter 1 x 10 on R and L Practicing hip hinge  against wall and with chair for safety  without making contact for tempo squats Deadlift  with 10 lb x 5 on 8 inch elevated box Hip hinge with back to 8 inch elevated box and VC and TC for proper hip hinge form Self Care: Sleep hygiene Lifting principles Deadlifting and protection of back  Endurance exercise education with plan for 15 min timed period [facility] Adult PT Treatment:                                                DATE: 11-12-22 Therapeutic Exercise: Supine Pelvic Tilt   2 sets - 10 reps Supine SKTC  1 sets - 5 reps - 10 hold Supine Lower Trunk Rotation   5 x 20 sec R and L 526.8 ft (91290 ft - 1755 ft) Manual Therapy: STW of  left QL and over lumbar and sacral paraspinals Trigger Point Dry-Needling performed     by Garen Lah Treatment instructions: Expect mild to moderate muscle soreness. S/S of pneumothorax if dry needled over  a lung field, and to seek immediate medical attention should they occur. Patient verbalized understanding of these instructions and education.  Patient Consent Given: Yes Education handout provided: Previously provided Muscles treated:  L  QL and  L lumbar/sacral paraspinals. L Gluteals Electrical stimulation performed: No Parameters: N/A Treatment response/outcome: twitch response noted, pt noted relief [facility] Adult PT Treatment:                                                DATE: 11-07-22 Pt enters building ambulating independently using a SPC to ambulate. Treatment took place in water 3.8 to  4 ft 8 in.feet deep depending upon activity.  Pt entered and exited the pool via  stair and handrails independently. Pt initiated Rx with 6/10 pain with increased new pain down my left side and it just started last night. At end of session 4/10 Acclimating to water by walking back and forth and side stepping using rainbow DB in bil UE for security and balance in water Aquatic Exercise:   Pt hip hinge using pool noodle  Gentle lumbar rotation with pool noodle yellow Thoracic AROM at edge of pool Kickboard side to side resistance Hip abduction/adduction x10 BIL Hip ext/flex with knee straight R x 10 Hip circles with CCW and CW   Water step ups on submerged step 10 x R and L  SL step in water without aquatic flotation to work on balance with turbulence in water  SL stand and kicking for balance without UE support Squats x 20    Bad Ragaz, Pt with lumbar belt around hips and nek doodle for neck support.  PT at torso and assisting with trunk left to right and vice versa to engage trunk muscles. PT then rotated trunk in order to engage abdominal (internal and external obliques) Emphasis on breathing techniques to draw in abdominals for support.  Pt then utilizing posterior chain and engaging Hip extension and knee flexion with water resistance . LAD over BIL LE.     Pt requires the buoyancy of water for active assisted exercises with buoyancy supported for strengthening and AROM exercises. Hydrostatic pressure also supports joints by unweighting joint load by at least 50 % in 3-4 feet depth water. 80% in chest to neck deep water. Water will provide assistance with movement using the current and laminar flow while the buoyancy reduces weight bearing. Pt requires the viscosity of the water for resistance endurance  with strengthening exercises.         [facility] Adult PT Treatment:                                                DATE: 11-01-22 Therapeutic Exercise: STS 1 x 10 STS 2 x 10 with 6 lb weight  Step ups with 6 inch with CGA x 1  1 x 10 R and L Supine Pelvic Tilt   3  sets - 10 reps Supine SKTC  1 sets - 5 reps - 10 hold Supine Lower Trunk Rotation   5 x 20 sec R and L Manual Therapy: STW of  left QL and over lumbar and sacral paraspinals Trigger Point Dry-Needling performed     by Garen Lah Treatment instructions: Expect mild  to moderate muscle soreness. S/S of pneumothorax if dry needled over a lung field, and to seek immediate medical attention should they occur. Patient verbalized understanding of these instructions and education.  Patient Consent Given: Yes Education handout provided: Previously provided Muscles treated:  L  QL and  L lumbar/sacral paraspinals L5/s1 Electrical stimulation performed: No Parameters: N/A Treatment response/outcome: twitch response noted, pt noted relief  [facility] Adult PT Treatment:                                                DATE: 10-24-22 Pt enters building ambulating independently. Treatment took place in water 3.8 to  4 ft 8 in.feet deep depending upon activity.  Pt entered and exited the pool via stair and handrails independently. Pt initiated Rx with 7/10 pain. At end of session 4/10  Acclimating to water by walking back and forth and side stepping using rainbow DB in bil UE for security and balance in water Ms.  Mortland was educated on  beneficial therapeutic effects of water while ambulating to acclimate to water walking forward, backward and side stepping.  Pt educated on neutral posture and hip hinging in seated position with water at chest level x 10 with stretch to low back and then x 10 with back at pool wall at external cue, VC for neck tucked to prevent hyperextension.  Also reinforced with STS on submerge bench and with submerged step with 90/90 hip for greater depth of squat. Aquatic Exercise:   Pt hip hinge using pool noodle and then gentle lumbar rotataion Thoracic AROM at edge of pool Hip abduction/adduction x10 BIL Hip ext/flex with knee straight R x 20   Standing back stretch with  UE on  edge of pool  KB up and down press   Squats x 20 Heel and toe raises Toe yoga in pool Ambulating forward, backward and side stepping with CGA x 1 at beginning of session and then I with rainbow DB at end of session for 5 rounds of 30 feet. each      Pt requires the buoyancy of water for active assisted exercises with buoyancy supported for strengthening and AROM exercises. Hydrostatic pressure also supports joints by unweighting joint load by at least 50 % in 3-4 feet depth water. 80% in chest to neck deep water. Water will provide assistance with movement using the current and laminar flow while the buoyancy reduces weight bearing. Pt requires the viscosity of the water for resistance endurance  with strengthening exercises.                                                                                                                              DATE: EVAL 10-23-22  issue HEP    PATIENT EDUCATION:  Education details: POC Explanation of findings, FOTO, issue HEP Person  educated: Patient Education method: Explanation, Demonstration, Tactile cues, Verbal cues, and Handouts Education comprehension: verbalized understanding, returned demonstration, verbal cues required, tactile cues required, and needs further education  HOME EXERCISE PROGRAM: Access Code: [email or web address] URL: [email or web address] Date: 10/23/2022 Prepared by: Garen Lah  Exercises - Supine Pelvic Tilt  - 1 x daily - 7 x weekly - 3 sets - 10 reps - Supine Single Knee to Chest Stretch  - 2 x daily - 7 x weekly - 1 sets - 5 reps - 10 hold - Supine Lower Trunk Rotation  - 2 x daily - 7 x weekly - 1 sets - 5 reps - 20 hold - Sit to stand with sink support Movement snack  - 1 x daily - 7 x weekly - 3 sets - 10 reps Added 11-01-22  - Clamshell with Resistance  - 1 x daily - 7 x weekly - 3 sets - 10 reps - Hip Abduction with Resistance Loop  - 1 x daily - 7 x weekly - 3 sets - 10 reps - Hip Extension with  Resistance Loop  - 1 x daily - 7 x weekly - 3 sets - 10 reps - Forward Step Up with Counter Support  - 1 x daily - 7 x weekly - 3 sets - 10 reps ASSESSMENT:  CLINICAL IMPRESSION: Session today focused pain management of bil QL and left SI pain with manual and TPDN. Pt consents to TPDN and is closely monitored throughout session.  Pt needs to improve walking endurance and given handout for techniques for incorporating walking. Pt consented to TPDN and was closely monitored throughout session.  Pt with HEP but not challenging herself for increased muscle strength at home.  Pt was encouraged to join gym and to have accountability when she DC's from PT.    Pt is semi compliant with exercise at home and fatigues easily.  Pt  continues to be discouraged that her pain has not greatly improved since surgery in June and she has consulted her family MD for depression care.  Pt before next appt was challenged to join local community wellness and remainder of appt.with reinforce HEP  Pt left with all questions answered and no adverse effects post this session of TPDN being constantly monitored throughout session and with decreased muscle tension post TPDN. Will continue with POC and progressive strengthening.      EVAL- Patient is a 77  y.o. female who was seen today for physical therapy evaluation and treatment for continuing pain post surgeries (2) and most recent . Laminectomy Bil PLIF L5/S1 08-10-22, Pt has difficulty walking and remains sedentary due to pain. She has hx of fibromyalgia and bil neuropathy with complaints of pain in bil bottom of feet.  She reports she does UE exercises but she does not feel safe standing and walking. Ms Brannam sleep is disturbed and she cannot sleep for longer than 2 hours at night.  Pt arrives at PT to try to get stronger and to improve her ability to move.  She seeks TPDN, HEP and aquatics to help her achieve her goals.  Pt also has some Positive signs of Left SI jt irritation. Pt  will benefit from skilled PT to address her impairment and her immobility due to pain and weakness to return her to a more active lifestyle and continuance of healthy habits.  OBJECTIVE IMPAIRMENTS: decreased activity tolerance, decreased balance, decreased mobility, difficulty walking, decreased ROM, decreased strength, postural dysfunction, obesity, and pain.   ACTIVITY LIMITATIONS: bending,  standing, squatting, sleeping, stairs, transfers, bed mobility, and locomotion level  PARTICIPATION LIMITATIONS: meal prep, cleaning, laundry, shopping, and community activity  PERSONAL FACTORS: Spinal stenosis, neuropathy in bil feet, low kidney fx, Hypothyroidism HTN gout , Fibromyalgia, depression, OA. Osteopenia, lumbar fusion 2021, Laminectomy Bi PLIF L5/s1 08-10-22, carpal tunnel R 10-17-2018. Exploratory back surgery 07/23 are also affecting patient's functional outcome.   REHAB POTENTIAL:Good but challenging due to bil neuropathy and multiple back surgeries  CLINICAL DECISION MAKING: Evolving/moderate complexity  EVALUATION COMPLEXITY: Moderate   GOALS: Goals reviewed with patient? Yes  SHORT TERM GOALS: Target date: 11-20-22  Pt will be I with initial HEP Baseline:no knowledge Goal status:MET  2.  Pt pain will be decreased  by 25% Baseline: 9/10 at eval at worst. 11-12-22  at worst now still a 9/10 11-20-22  Pt 3/10 today  Goal status: MET  3.  Pt will be able to sleep at night for 3 or more hours of consecutive sleep without awakening due to pain Baseline: wake every 2-3 hours 11-12-22 Pt had bad weekend and waking every 2 hours and went back to sleep for 3 hours 11-20-22 my Right leg has pain I got up and did stretches and it helped  I sleep  3 hours Goal status:MET  4.  Pt will be able to perform 6 MWT without fatigueing Baseline:  fatigued after 2 MWT  99 ft 11-12-22 526.8 ft (91290 ft - 1755 ft) Goal status:MET  5.  Pt will be able to be educated in aquatic therapy for  exercise in environment with decreased joint loading.  Baseline: No knowledge about aquatics 11-12-22  has been to aquatics x 2 Goal status:MET   LONG TERM GOALS: Target date: 12-18-22  Pt will be able to be I with advanced HEP Baseline:  Goal status: ONGOING  2.  Pt pain will decrease for functional activities  and household chores by 50% Baseline: unable to stand for 5 min and cannot complete chores 11-22-22   Goal status: INITIAL  3.  Pt will be able to perform 5 x STS with 15 sec or less to show increased LE strength Baseline: 30.06 sec 11-20-22 5 x STS   18.47 ec Goal status: ONGOING  4.  will be able to lift 15# from the floor, not limited by pain, to allow completion of housework  Baseline: unable to lift items from floor 11-27-22   using 15# for STS and 25# for STS for 5x to progress Goal status:ONGOING  5.  Pt will report 4 or more hours of uninterrupted sleep due to pain Baseline: Awakes every 2-3 hours due to pain at night 11-20-22  3 hours Goal status:ONGOING  6.  FOTO will improve from  36%  to 43%    indicating improved functional mobility.  Baseline: EVAL 36% 11-20-22  44% Goal status: MET  PLAN:  PT FREQUENCY: 1-2x/week  PT DURATION: 8 weeks  PLANNED INTERVENTIONS: Therapeutic exercises, Therapeutic activity, Neuromuscular re-education, Balance training, Gait training, Patient/Family education, Self Care, Joint mobilization, Stair training, DME instructions, Aquatic Therapy, Dry Needling, Electrical stimulation, Spinal mobilization, Cryotherapy, Moist heat, Taping, Manual therapy, and Re-evaluation.  PLAN FOR NEXT SESSION: TPDN. Aquatics  HEP  Left SI RX  Garen Lah, PT, ATR[facility] Certified Exercise Expert for the Aging Adult  12/06/22 12:46 PM Phone: [phone] Fax: [phone]

## 2022-12-08 ENCOUNTER — Other Ambulatory Visit: Payer: Self-pay | Admitting: Cardiology

## 2022-12-08 ENCOUNTER — Other Ambulatory Visit: Payer: Self-pay | Admitting: Internal Medicine

## 2022-12-08 DIAGNOSIS — E782 Mixed hyperlipidemia: Secondary | ICD-10-CM

## 2022-12-11 NOTE — Therapy (Signed)
OUTPATIENT PHYSICAL THERAPY THORACOLUMBAR TREATMENT/Progress note Progress Note Reporting Period 10-23-22 to 12-12-22  See note below for Objective Data and Assessment of Progress/Goals.       Patient Name: Patricia Parks MRN: [id number] DOB:[birth year], 77 y.o., female Today's Date: 12/12/2022  END OF SESSION:  PT End of Session - 12/12/22 1235     Visit Number 10    Number of Visits 16    Date for PT Re-Evaluation 12/18/22    Authorization Type [employer] MCR    Progress Note Due on Visit 10    PT Start Time 1230    PT Stop Time 1319    PT Time Calculation (min) 49 min    Activity Tolerance Patient tolerated treatment well    Behavior During Therapy WFL for tasks assessed/performed                     Past Medical History:  Diagnosis Date   Anxiety    Arthritis    Depression    Fatty liver    Fibromyalgia    Gout 04/2017   Hyperlipidemia    Hypertension    Hypothyroidism    pt no longer takes meds- just monitoring thyroid levels   Low kidney function    followed by Dr Nicholos Johns   Neuropathy    in both feet   Spinal stenosis    Past Surgical History:  Procedure Laterality Date   BACK SURGERY  09/13/2021   Carpel Tunnel Surgery Right 10/17/2018   both wrists   CATARACT EXTRACTION W/ INTRAOCULAR LENS IMPLANT Bilateral 10/25/2015   Right eye was first.  Left eye done 10/17.   CERVICAL BIOPSY  W/ LOOP ELECTRODE EXCISION  12/2008   hx CIN II   COLONOSCOPY     LAMINECTOMY WITH POSTERIOR LATERAL ARTHRODESIS LEVEL 1 Bilateral 08/10/2022   Procedure: Reexploration of Fusion Lumbar Five- Sacral One with Redo Postero-Lateral Fusion Replacement of Hardware and Posterio Lateral Arthodesis;  Surgeon: Donalee Citrin, MD;  Location: [facility] OR;  Service: Neurosurgery;  Laterality: Bilateral;   LUMBAR FUSION  2021   RADIOLOGY WITH ANESTHESIA N/A 07/26/2020   Procedure: MRI WITH ANESTHESIA  LUMBAR WITH AND WITHOUT CONTRAST;  Surgeon: Radiologist, Medication, MD;  Location: [facility] OR;   Service: Radiology;  Laterality: N/A;   Patient Active Problem List   Diagnosis Date Noted   Pseudoarthrosis of lumbar spine 08/10/2022   Mixed hyperlipidemia 10/20/2021   Palpitations 10/20/2021   Spinal stenosis of lumbar region 09/13/2021   Spondylolisthesis at L4-L5 level 01/13/2020   Pain in left knee 05/28/2017   Elevated lipids 12/30/2014   History of cervical dysplasia 12/30/2014   Essential hypertension 09/10/2013   Chest pain 08/28/2013    PCP: Darryll Capers, PA-C   REFERRING PROVIDER: Donalee Citrin, MD   REFERRING DIAG: S32.009K (ICD-10-CM) - Unspecified fracture of unspecified lumbar vertebra, subsequent encounter for fracture with nonunion  Lumbar Pseudoarthrosis  Rationale for Evaluation and Treatment: Rehabilitation  THERAPY DIAG:  Chronic bilateral low back pain with right-sided sciatica  Sacral back pain  Unsteadiness on feet  Muscle weakness (generalized)  ONSET DATE: 08-10-22 laminectomy  Bil PLIF L5 S1  SUBJECTIVE:  SUBJECTIVE STATEMENT:  Ms Sonnek enters  pool with 3/10 pain and stating she feels better than she has been.  Pt has begun antidepressants and also seen a personal trainer Beatriz Chancellor for transition to community wellness upon DC    EVAL-I had back surgery again in June and pain is not getting better. I have had 3 back surgeries now.  I have trouble walking and in my bil legs but I have R pain down into the bottom of my whole R foot.. Left leg pain also down to my feet. A neurologist Dr Allena Katz told me I have neuropathy. I have had fibromyalgia for about 15 years. My right leg makes wake up with pain at night. I try to sleep with legs between my  legs and one against my back for supports.  I wake up every 2-3 hours at night.  I walk up and down the hall.. I try to do  Upper body exercise 2 x a week. I was a member of [employer]. I am unable to drive due to not being able to feel my legs  R worse than L  PERTINENT HISTORY:  Spinal stenosis, neuropathy in bil feet, low kidney fx, Hypothyroidism HTN gout , Fibromyalgia, depression, OA. Osteopenia, lumbar fusion 2021, Laminectomy Bi PLIF L5/s1 08-10-22, carpal tunnel R 10-17-2018. Exploratory back surgery 07/23  PAIN:  Are you having pain? Yes: NPRS scale: at rest after a pain pill 1/10  but when moving or decrease pain  6/10    at very worst it would be 8-9/10/10 Pain location: Back pain on left is 6/10 and at worst 8-9/10  and down R leg   Pain description: Left back pain is stabbing.  Constantly  and   R leg pain wakes up at night with stabbing pain but during the day it is tingling Aggravating factors: walking, sleeping at night, sometimes pacing at night helps  but any moving  sitting max 30 min depending on chair.   Standing 10-15 min.  Walking  5 minutes Relieving factors: pacing sometimes for a short time  PRECAUTIONS: None  RED FLAGS: Bowel or bladder incontinence: No   WEIGHT BEARING RESTRICTIONS: No  FALLS:  Has patient fallen in last 6 months? No but pt reports feeling unsteady and stumbles at times  LIVING ENVIRONMENT: Lives with: lives with their spouse Lives in: House/apartment Stairs: Yes: External: 3 steps; on right going up Has following equipment at home: Single point cane grab bars  a 2 wheeled walker  OCCUPATION: retired  Customer service manager  PLOF: Independent with household mobility with device  PATIENT GOALS:  TPDN and I would like to be stronger and not feel feeble  NEXT MD VISIT: TBD  OBJECTIVE:   DIAGNOSTIC FINDINGS:  CLINICAL DATA:  Lumbar region back pain radiating to the right leg. Previous lumbosacral surgeries.   EXAM: CT LUMBAR SPINE WITHOUT CONTRAST   TECHNIQUE: Multidetector CT imaging of the lumbar spine was performed without intravenous contrast  administration. Multiplanar CT image reconstructions were also generated.   RADIATION DOSE REDUCTION: This exam was performed according to the departmental dose-optimization program which includes automated exposure control, adjustment of the mA and/or kV according to patient size and/or use of iterative reconstruction technique.   COMPARISON:  Radiography 09/05/2022.  CT 05/18/2022.   FINDINGS: Segmentation: 5 lumbar type vertebral bodies as numbered previously.   Alignment: Chronic fixed scoliotic curvature convex to the right. 2 mm degenerative retrolisthesis at L1-2 is unchanged.   Vertebrae: No  new vertebral finding. See below for details at each level.   Paraspinal and other soft tissues: No significant finding.   Disc levels:   T11-12 and T12-L1: Normal   L1-2: Chronic degenerative retrolisthesis of 2 mm. Disc degeneration with bulging of the disc worse on the left. Facet degeneration and hypertrophy. Stenosis of the lateral recesses and foramina, left worse than right. Similar appearance to the study of March.   L2-3 and L3-4: Previous posterior decompression, diskectomy and fusion. Solid union with sufficient patency of the canal and foramina.   L4-5: Previous posterior decompression, diskectomy and fusion. Solid union with sufficient patency of the canal and foramina.   L5-S1: Previous posterior decompression, diskectomy and fusion procedure. Continued evidence of nonunion with nitrogen gas in the disc space and lucency around the S1 screws, left more than right. Central canal as well decompressed. There is foraminal narrowing on the right as seen previously that could possibly affect the L5 nerve.   IMPRESSION: 1. No change since the study of March of this year. Previous posterior decompression, diskectomy and fusion procedures from L2-3 through L4-5 with solid union and sufficient patency of the canal and foramina. 2. Continued evidence of nonunion at the  L5-S1 level with nitrogen gas in the disc space and lucency around the S1 screws, left more than right. No change in foraminal narrowing on the right at L5-S1. 3. L1-2 degenerative disc disease with 2 mm degenerative retrolisthesis. Stenosis of the lateral recesses and foramina, left worse than right. Similar appearance to the study of March.     Electronically Signed   By: Paulina Fusi M.D.   On: 10/07/2022 13:55  PATIENT SURVEYS:  FOTE  36%  predicted 43% 11-20-22  44% SCREENING FOR RED FLAGS: Bowel or bladder incontinence: No  COGNITION: Overall cognitive status: Within functional limits for tasks assessed     SENSATION: Pt with bil neuropathy and avoids driving car due to not being able to feel the brake pedal  MUSCLE LENGTH: Hamstrings:  WFL   POSTURE: rounded shoulders, forward head, and left pelvic level higher  Left anterior innominate rotation  PALPATION: Pt with Left SI sensitivity and + sacral compression and distraction test.  Pt with decreased pain with R LAD for R radiculopathy.  Pt with pain with palpation over Right Lumbar l-3 to S1 TTP over Left SI jt LUMBAR ROM:   AROM eval 11-20-22  Flexion Fingertips to floor Fingertips to floor  Extension 20% L low back 50% low back  Right lateral flexion Fingertips 2 inch below knee jt line pulling pain on left Fingertips 2 inch below knee jt line pulling pain on lef  Left lateral flexion Fingertips to knee jt line pain on L Fingertips 1 inch below knee jt line pulling pain on right  Right rotation 50% 60%  Left rotation 50% 60%   (Blank rows = not tested)  LOWER EXTREMITY ROM:   All WNL unless other wise noted  Active  Right eval Left eval R/L 11-27-22 R/L 12-12-22  Hip flexion Standing 55  Standing  60 88/86 94/90  Hip extension      Hip abduction      Hip adduction      Hip internal rotation 20   25/33  Hip external rotation      Knee flexion      Knee extension      Ankle dorsiflexion      Ankle  plantarflexion      Ankle inversion  Ankle eversion       (Blank rows = not tested)  LOWER EXTREMITY MMT:    MMT Right eval Left eval R/L 11-20-22 R/L 12-11-22  Hip flexion 3- 4- 4-/4- 4/4  Hip extension 3- 4- 4-/4- 4/4  Hip abduction 3- 4- 4-/4- 4/4  Hip adduction      Hip internal rotation      Hip external rotation      Knee flexion 4 4 4/4+ 4+/4+  Knee extension 4 4 4/4 4/4+  Ankle dorsiflexion      Ankle plantarflexion      Ankle inversion      Ankle eversion       (Blank rows = not tested)  LUMBAR SPECIAL TESTS:  NT due to lumbar surgery in June 2024  FUNCTIONAL TESTS:  5 times sit to stand: 30.06 sec   27ft with SPC 11-12-22 526.8 ft (1290 ft - 1755 ft) 11-22-22  6 MWT  620ft  ( norm 1290 ft - 1755 ft) 11-20-22 5 x STS   18.47 ec  GAIT: Distance walked: 150 feet  then 99 ft for Assistive device utilized: Single point cane Level of assistance: Modified independence Comments: Pt walks cautiously and very slowly and unsteady , not confident with gait           TODAY'S TREATMENT:  [facility] Adult PT Treatment:                                                DATE: 12-12-22 Pt enters building ambulating independently using a SPC to ambulate. Treatment took place in water 3.8 to  4 ft 8 in.feet deep depending upon activity.  Pt entered and exited the pool via stair and handrails independently. Pt initiated Rx with 3/10 pain  R SI down leg. At end of session 1/10 Acclimating to water by walking back and forth and side stepping using rainbow DB in bil UE for security and balance in water Aquatic Exercise:   Hip extension stomp on foot with yellow noodle on R and L 2 x 10 each Hip extension with yellow noodle at knee shorter lever arm 1 x 10 R and L Pt hip hinge using pool noodle  Gentle lumbar rotation with pool noodle yellow Back lengthening using yellow pool noodle Thoracic AROM at edge of pool Hip  flexion using strider band for resistance  1 x 10  on R and L Hip  abduction using strider band for resistance  1 x 10 on R and L Hip  adduction using strider band for resistance  1 x 10 on R and L Hip  extesion using strider band for resistance  1 x 10 on R and L Hip circles with CCW and CW   Water step ups on submerged step 10 x R and L  SL step in water without aquatic flotation to work on balance with turbulence in water  SL stand and kicking for balance without UE support Squats x 20    Bad Ragaz, Pt with lumbar belt around hips and nek doodle for neck support.  PT at torso and assisting with trunk left to right and vice versa to engage trunk muscles. PT then rotated trunk in order to engage abdominal (internal and external obliques) Emphasis on breathing techniques to draw in abdominals for support.  Pt then utilizing  posterior chain and engaging Hip extension and knee flexion with water resistance . LAD over BIL LE.     Pt requires the buoyancy of water for active assisted exercises with buoyancy supported for strengthening and AROM exercises. Hydrostatic pressure also supports joints by unweighting joint load by at least 50 % in 3-4 feet depth water. 80% in chest to neck deep water. Water will provide assistance with movement using the current and laminar flow while the buoyancy reduces weight bearing. Pt requires the viscosity of the water for resistance endurance  with strengthening exercises.     [facility] Adult PT Treatment:                                                DATE: 12-06-22 Therapeutic Exercise: STS with no UE support 10 x STS with 15 # KB 2 x 10 Bridge with ball   4 sets of  5 Hip Abduction with RTB  2 x 10 R and L Hip Extension with RTB 2 x  10 R and L Table top marching 10 Single limb cone tapping with Supervision for safety  Manual Therapy: STW of  BIl QL and over lumbar and sacral paraspinals Use of tennis ball over left piriformis  Left iliacus MET for Left SI Trigger Point Dry-Needling performed     by  Garen Lah Treatment instructions: Expect mild to moderate muscle soreness. S/S of pneumothorax if dry needled over a lung field, and to seek immediate medical attention should they occur. Patient verbalized understanding of these instructions and education.  Patient Consent Given: Yes Education handout provided: Previously provided Muscles treated:  Bil  QL and  Bil lumbar/sacral paraspinals. Bil Gluteals and Left piriformis Electrical stimulation performed: No Parameters: N/A Treatment response/outcome: twitch response noted, pt noted relief  Self Care: Walking program Explanation for need for endurance with walking  10 min 2 - 3 x a day to decreased sedentary behavior [facility] Adult PT Treatment:                                                DATE: 11-27-22 Therapeutic Exercise: Recumbent bike 7 min  level 2 Clamshell with Resistance  with RTB 3  - 10 reps STS with 15 # KB  1 x 10  then 25 #  2 x 5 with Close supervision by PT TA engagement x 10 Bridge with ball   1 x 10 Hip Abduction with RTB  2 x 10 R and L Hip Extension with RTB 2 x  10 R and L Forward Step Up with Counter Support 6 inch step 1 x 10  Self Care: Community resources for exercise post DC. Pt will join in next 3 weeks  Self care and prioritizing strengthening on land vs  water for muscle hypertrophy  [facility] Adult PT Treatment:                                                DATE: 11-22-22 Therapeutic Exercise: 6 MWT  642ft  ( norm 1290 ft - 1755 ft) Prone hip extension 2 x 10  on R and L   L and R sidelying  2 x 10 hip abduction Bridging with pillow squeeze  4 sets of 5 to reduce hamstring cramping Manual Therapy: STW of  BIl QL and over lumbar and sacral paraspinals Trigger Point Dry-Needling performed     by Garen Lah Treatment instructions: Expect mild to moderate muscle soreness. S/S of pneumothorax if dry needled over a lung field, and to seek immediate medical attention should they occur. Patient  verbalized understanding of these instructions and education.  Patient Consent Given: Yes Education handout provided: Previously provided Muscles treated:  Bil  QL and  Bil lumbar/sacral paraspinals. Bil Gluteals Electrical stimulation performed: No Parameters: N/A Treatment response/outcome: twitch response noted, pt noted relief  [facility] Adult PT Treatment:                                                DATE: 11-20-22 Therapeutic Exercise: STS with 10 lb  3 x 10 Knee ext Omega  2 x 10 bil Knee flexion Omega 2 x 10 Reverse lunge with UE on chair back and counter 1 x 10 on R and L Practicing hip hinge  against wall and with chair for safety  without making contact for tempo squats Deadlift  with 10 lb x 5 on 8 inch elevated box Hip hinge with back to 8 inch elevated box and VC and TC for proper hip hinge form Self Care: Sleep hygiene Lifting principles Deadlifting and protection of back  Endurance exercise education with plan for 15 min timed period [facility] Adult PT Treatment:                                                DATE: 11-12-22 Therapeutic Exercise: Supine Pelvic Tilt   2 sets - 10 reps Supine SKTC  1 sets - 5 reps - 10 hold Supine Lower Trunk Rotation   5 x 20 sec R and L 526.8 ft (91290 ft - 1755 ft) Manual Therapy: STW of  left QL and over lumbar and sacral paraspinals Trigger Point Dry-Needling performed     by Garen Lah Treatment instructions: Expect mild to moderate muscle soreness. S/S of pneumothorax if dry needled over a lung field, and to seek immediate medical attention should they occur. Patient verbalized understanding of these instructions and education.  Patient Consent Given: Yes Education handout provided: Previously provided Muscles treated:  L  QL and  L lumbar/sacral paraspinals. L Gluteals Electrical stimulation performed: No Parameters: N/A Treatment response/outcome: twitch response noted, pt noted relief [facility] Adult PT Treatment:                                                 DATE: 11-07-22 Pt enters building ambulating independently using a SPC to ambulate. Treatment took place in water 3.8 to  4 ft 8 in.feet deep depending upon activity.  Pt entered and exited the pool via stair and handrails independently. Pt initiated Rx with 6/10 pain with increased new pain down my left side and it just started last night. At end of session 4/10  Acclimating to water by walking back and forth and side stepping using rainbow DB in bil UE for security and balance in water Aquatic Exercise:   Pt hip hinge using pool noodle  Gentle lumbar rotation with pool noodle yellow Thoracic AROM at edge of pool Kickboard side to side resistance Hip abduction/adduction x10 BIL Hip ext/flex with knee straight R x 10 Hip circles with CCW and CW   Water step ups on submerged step 10 x R and L  SL step in water without aquatic flotation to work on balance with turbulence in water  SL stand and kicking for balance without UE support Squats x 20    Bad Ragaz, Pt with lumbar belt around hips and nek doodle for neck support.  PT at torso and assisting with trunk left to right and vice versa to engage trunk muscles. PT then rotated trunk in order to engage abdominal (internal and external obliques) Emphasis on breathing techniques to draw in abdominals for support.  Pt then utilizing posterior chain and engaging Hip extension and knee flexion with water resistance . LAD over BIL LE.     Pt requires the buoyancy of water for active assisted exercises with buoyancy supported for strengthening and AROM exercises. Hydrostatic pressure also supports joints by unweighting joint load by at least 50 % in 3-4 feet depth water. 80% in chest to neck deep water. Water will provide assistance with movement using the current and laminar flow while the buoyancy reduces weight bearing. Pt requires the viscosity of the water for resistance endurance  with strengthening  exercises.         [facility] Adult PT Treatment:                                                DATE: 11-01-22 Therapeutic Exercise: STS 1 x 10 STS 2 x 10 with 6 lb weight  Step ups with 6 inch with CGA x 1  1 x 10 R and L Supine Pelvic Tilt   3 sets - 10 reps Supine SKTC  1 sets - 5 reps - 10 hold Supine Lower Trunk Rotation   5 x 20 sec R and L Manual Therapy: STW of  left QL and over lumbar and sacral paraspinals Trigger Point Dry-Needling performed     by Garen Lah Treatment instructions: Expect mild to moderate muscle soreness. S/S of pneumothorax if dry needled over a lung field, and to seek immediate medical attention should they occur. Patient verbalized understanding of these instructions and education.  Patient Consent Given: Yes Education handout provided: Previously provided Muscles treated:  L  QL and  L lumbar/sacral paraspinals L5/s1 Electrical stimulation performed: No Parameters: N/A Treatment response/outcome: twitch response noted, pt noted relief  [facility] Adult PT Treatment:                                                DATE: 10-24-22 Pt enters building ambulating independently. Treatment took place in water 3.8 to  4 ft 8 in.feet deep depending upon activity.  Pt entered and exited the pool via stair and handrails independently. Pt initiated Rx with 7/10 pain. At end of session 4/10  Acclimating to water by walking back and forth  and side stepping using rainbow DB in bil UE for security and balance in water Ms.  Riddley was educated on  beneficial therapeutic effects of water while ambulating to acclimate to water walking forward, backward and side stepping.  Pt educated on neutral posture and hip hinging in seated position with water at chest level x 10 with stretch to low back and then x 10 with back at pool wall at external cue, VC for neck tucked to prevent hyperextension.  Also reinforced with STS on submerge bench and with submerged step with 90/90 hip for  greater depth of squat. Aquatic Exercise:   Pt hip hinge using pool noodle and then gentle lumbar rotataion Thoracic AROM at edge of pool Hip abduction/adduction x10 BIL Hip ext/flex with knee straight R x 20   Standing back stretch with  UE on edge of pool  KB up and down press   Squats x 20 Heel and toe raises Toe yoga in pool Ambulating forward, backward and side stepping with CGA x 1 at beginning of session and then I with rainbow DB at end of session for 5 rounds of 30 feet. each      Pt requires the buoyancy of water for active assisted exercises with buoyancy supported for strengthening and AROM exercises. Hydrostatic pressure also supports joints by unweighting joint load by at least 50 % in 3-4 feet depth water. 80% in chest to neck deep water. Water will provide assistance with movement using the current and laminar flow while the buoyancy reduces weight bearing. Pt requires the viscosity of the water for resistance endurance  with strengthening exercises.                                                                                                                              DATE: EVAL 10-23-22  issue HEP    PATIENT EDUCATION:  Education details: POC Explanation of findings, FOTO, issue HEP Person educated: Patient Education method: Explanation, Demonstration, Tactile cues, Verbal cues, and Handouts Education comprehension: verbalized understanding, returned demonstration, verbal cues required, tactile cues required, and needs further education  HOME EXERCISE PROGRAM: Access Code: [email or web address] URL: [email or web address] Date: 10/23/2022 Prepared by: Garen Lah  Exercises - Supine Pelvic Tilt  - 1 x daily - 7 x weekly - 3 sets - 10 reps - Supine Single Knee to Chest Stretch  - 2 x daily - 7 x weekly - 1 sets - 5 reps - 10 hold - Supine Lower Trunk Rotation  - 2 x daily - 7 x weekly - 1 sets - 5 reps - 20 hold - Sit to stand with sink support Movement  snack  - 1 x daily - 7 x weekly - 3 sets - 10 reps Added 11-01-22  - Clamshell with Resistance  - 1 x daily - 7 x weekly - 3 sets - 10 reps - Hip Abduction with Resistance Loop  - 1 x daily - 7 x weekly -  3 sets - 10 reps - Hip Extension with Resistance Loop  - 1 x daily - 7 x weekly - 3 sets - 10 reps - Forward Step Up with Counter Support  - 1 x daily - 7 x weekly - 3 sets - 10 reps ASSESSMENT:  CLINICAL IMPRESSION:  Progress note today for 10th visit.Session today focused  on reinforcing HEP and giving handout as well as progressing with resistance in aquatics using strider resistance bands.  Pt reports feeling better with 3/10 pain in R SI and reduced to 1/10 at end of session. Pt required the buoyancy of water for active assisted exercises with buoyancy supported for strengthening and AROM exercises Pt also reports that she has met with a personal trainer to continue strengthening post DC from PT with Beatriz Chancellor.  Pt 5 xSTS improved to 18.47 sec and her MMT and AROM improved from evaluation.  Pt continues to fatigue easily on land clinic visits but enjoys the water.  Her new gym will incorporate both.  We were able to progress aquatic exercise today to include more dynamic core stabilization and higher load hip ext.  Patient was able to tolerate all prescribed exercises in the aquatic environment with no adverse effects and reports 5/10 pain at the end of the session Pt left with all questions answered and no adverse effects. Pt will continue exercises on land only and given aquatic HEP      EVAL- Patient is a 77  y.o. female who was seen today for physical therapy evaluation and treatment for continuing pain post surgeries (2) and most recent . Laminectomy Bil PLIF L5/S1 08-10-22, Pt has difficulty walking and remains sedentary due to pain. She has hx of fibromyalgia and bil neuropathy with complaints of pain in bil bottom of feet.  She reports she does UE exercises but she does not feel safe  standing and walking. Ms Vanosten sleep is disturbed and she cannot sleep for longer than 2 hours at night.  Pt arrives at PT to try to get stronger and to improve her ability to move.  She seeks TPDN, HEP and aquatics to help her achieve her goals.  Pt also has some Positive signs of Left SI jt irritation. Pt will benefit from skilled PT to address her impairment and her immobility due to pain and weakness to return her to a more active lifestyle and continuance of healthy habits.  OBJECTIVE IMPAIRMENTS: decreased activity tolerance, decreased balance, decreased mobility, difficulty walking, decreased ROM, decreased strength, postural dysfunction, obesity, and pain.   ACTIVITY LIMITATIONS: bending, standing, squatting, sleeping, stairs, transfers, bed mobility, and locomotion level  PARTICIPATION LIMITATIONS: meal prep, cleaning, laundry, shopping, and community activity  PERSONAL FACTORS: Spinal stenosis, neuropathy in bil feet, low kidney fx, Hypothyroidism HTN gout , Fibromyalgia, depression, OA. Osteopenia, lumbar fusion 2021, Laminectomy Bi PLIF L5/s1 08-10-22, carpal tunnel R 10-17-2018. Exploratory back surgery 07/23 are also affecting patient's functional outcome.   REHAB POTENTIAL:Good but challenging due to bil neuropathy and multiple back surgeries  CLINICAL DECISION MAKING: Evolving/moderate complexity  EVALUATION COMPLEXITY: Moderate   GOALS: Goals reviewed with patient? Yes  SHORT TERM GOALS: Target date: 11-20-22  Pt will be I with initial HEP Baseline:no knowledge Goal status:MET  2.  Pt pain will be decreased  by 25% Baseline: 9/10 at eval at worst. 11-12-22  at worst now still a 9/10 11-20-22  Pt 3/10 today  Goal status: MET  3.  Pt will be able to sleep at night for  3 or more hours of consecutive sleep without awakening due to pain Baseline: wake every 2-3 hours 11-12-22 Pt had bad weekend and waking every 2 hours and went back to sleep for 3 hours 11-20-22 my Right leg  has pain I got up and did stretches and it helped  I sleep  3 hours Goal status:MET  4.  Pt will be able to perform 6 MWT without fatigueing Baseline:  fatigued after 2 MWT  99 ft 11-12-22 526.8 ft (91290 ft - 1755 ft) Goal status:MET  5.  Pt will be able to be educated in aquatic therapy for exercise in environment with decreased joint loading.  Baseline: No knowledge about aquatics 11-12-22  has been to aquatics x 2 Goal status:MET   LONG TERM GOALS: Target date: 12-18-22  Pt will be able to be I with advanced HEP Baseline:  Goal status: ONGOING  2.  Pt pain will decrease for functional activities  and household chores by 50% Baseline: unable to stand for 5 min and cannot complete chores 11-22-22   Goal status: INITIAL  3.  Pt will be able to perform 5 x STS with 15 sec or less to show increased LE strength Baseline: 30.06 sec 11-20-22 5 x STS   18.47 ec Goal status: ONGOING  4.  will be able to lift 15# from the floor, not limited by pain, to allow completion of housework  Baseline: unable to lift items from floor 11-27-22   using 15# for STS and 25# for STS for 5x to progress Goal status:ONGOING  5.  Pt will report 4 or more hours of uninterrupted sleep due to pain Baseline: Awakes every 2-3 hours due to pain at night 11-20-22  3 hours Goal status:ONGOING  6.  FOTO will improve from  36%  to 43%    indicating improved functional mobility.  Baseline: EVAL 36% 11-20-22  44% Goal status: MET  PLAN:  PT FREQUENCY: 1-2x/week  PT DURATION: 8 weeks  PLANNED INTERVENTIONS: Therapeutic exercises, Therapeutic activity, Neuromuscular re-education, Balance training, Gait training, Patient/Family education, Self Care, Joint mobilization, Stair training, DME instructions, Aquatic Therapy, Dry Needling, Electrical stimulation, Spinal mobilization, Cryotherapy, Moist heat, Taping, Manual therapy, and Re-evaluation.  PLAN FOR NEXT SESSION: TPDN. Aquatics  HEP  Left SI RX  Garen Lah, PT, ATRIC Certified Exercise Expert for the Aging Adult  12/12/22 1:46 PM Phone: [phone] Fax: [phone]

## 2022-12-12 ENCOUNTER — Ambulatory Visit: Payer: Medicare HMO | Admitting: Physical Therapy

## 2022-12-12 ENCOUNTER — Encounter: Payer: Self-pay | Admitting: Physical Therapy

## 2022-12-12 DIAGNOSIS — M533 Sacrococcygeal disorders, not elsewhere classified: Secondary | ICD-10-CM

## 2022-12-12 DIAGNOSIS — M5441 Lumbago with sciatica, right side: Secondary | ICD-10-CM | POA: Diagnosis not present

## 2022-12-12 DIAGNOSIS — G8929 Other chronic pain: Secondary | ICD-10-CM

## 2022-12-12 DIAGNOSIS — M6281 Muscle weakness (generalized): Secondary | ICD-10-CM

## 2022-12-12 DIAGNOSIS — R2681 Unsteadiness on feet: Secondary | ICD-10-CM

## 2022-12-13 NOTE — Therapy (Signed)
OUTPATIENT PHYSICAL THERAPY THORACOLUMBAR TREATMENT/ERO/Recertificaton      Patient Name: Patricia Parks MRN: [id number] DOB:03-18-1945, 77 y.o., female Today's Date: 12/18/2022  END OF SESSION:  PT End of Session - 12/18/22 1553     Visit Number 11    Number of Visits 16    Date for PT Re-Evaluation 01/24/23    Authorization Type [employer] MCR    Progress Note Due on Visit 10    PT Start Time 1545    PT Stop Time 1630    PT Time Calculation (min) 45 min    Activity Tolerance Patient tolerated treatment well    Behavior During Therapy WFL for tasks assessed/performed                      Past Medical History:  Diagnosis Date   Anxiety    Arthritis    Depression    Fatty liver    Fibromyalgia    Gout 04/2017   Hyperlipidemia    Hypertension    Hypothyroidism    pt no longer takes meds- just monitoring thyroid levels   Low kidney function    followed by Dr Nicholos Johns   Neuropathy    in both feet   Spinal stenosis    Past Surgical History:  Procedure Laterality Date   BACK SURGERY  09/13/2021   Carpel Tunnel Surgery Right 10/17/2018   both wrists   CATARACT EXTRACTION W/ INTRAOCULAR LENS IMPLANT Bilateral 10/25/2015   Right eye was first.  Left eye done 10/17.   CERVICAL BIOPSY  W/ LOOP ELECTRODE EXCISION  12/2008   hx CIN II   COLONOSCOPY     LAMINECTOMY WITH POSTERIOR LATERAL ARTHRODESIS LEVEL 1 Bilateral 08/10/2022   Procedure: Reexploration of Fusion Lumbar Five- Sacral One with Redo Postero-Lateral Fusion Replacement of Hardware and Posterio Lateral Arthodesis;  Surgeon: Donalee Citrin, MD;  Location: [facility] OR;  Service: Neurosurgery;  Laterality: Bilateral;   LUMBAR FUSION  2021   RADIOLOGY WITH ANESTHESIA N/A 07/26/2020   Procedure: MRI WITH ANESTHESIA  LUMBAR WITH AND WITHOUT CONTRAST;  Surgeon: Radiologist, Medication, MD;  Location: [facility] OR;  Service: Radiology;  Laterality: N/A;   Patient Active Problem List   Diagnosis Date Noted   Pseudoarthrosis of  lumbar spine 08/10/2022   Mixed hyperlipidemia 10/20/2021   Palpitations 10/20/2021   Spinal stenosis of lumbar region 09/13/2021   Spondylolisthesis at L4-L5 level 01/13/2020   Pain in left knee 05/28/2017   Elevated lipids 12/30/2014   History of cervical dysplasia 12/30/2014   Essential hypertension 09/10/2013   Chest pain 08/28/2013    PCP: Darryll Capers, PA-C   REFERRING PROVIDER: Donalee Citrin, MD   REFERRING DIAG: S32.009K (ICD-10-CM) - Unspecified fracture of unspecified lumbar vertebra, subsequent encounter for fracture with nonunion  Lumbar Pseudoarthrosis  Rationale for Evaluation and Treatment: Rehabilitation  THERAPY DIAG:  Chronic bilateral low back pain with right-sided sciatica - Plan: PT plan of care cert/re-cert  Sacral back pain - Plan: PT plan of care cert/re-cert  Unsteadiness on feet - Plan: PT plan of care cert/re-cert  Muscle weakness (generalized) - Plan: PT plan of care cert/re-cert  ONSET DATE: 08-10-22 laminectomy  Bil PLIF L5 S1  SUBJECTIVE:  SUBJECTIVE STATEMENT:  Ms Sollis enters  pool with 3/10 pain localized at R SI  Pt has begun antidepressants and also seen a personal trainer Beatriz Chancellor for transition to community wellness upon DC    EVAL-I had back surgery again in June and pain is not getting better. I have had 3 back surgeries now.  I have trouble walking and in my bil legs but I have R pain down into the bottom of my whole R foot.. Left leg pain also down to my feet. A neurologist Dr Allena Katz told me I have neuropathy. I have had fibromyalgia for about 15 years. My right leg makes wake up with pain at night. I try to sleep with legs between my  legs and one against my back for supports.  I wake up every 2-3 hours at night.  I walk up and down the hall.. I try to do  Upper body exercise 2 x a week. I was a member of [employer]. I am unable to drive due to not being able to feel my legs  R worse than L  PERTINENT HISTORY:  Spinal stenosis, neuropathy in bil feet, low kidney fx, Hypothyroidism HTN gout , Fibromyalgia, depression, OA. Osteopenia, lumbar fusion 2021, Laminectomy Bi PLIF L5/s1 08-10-22, carpal tunnel R 10-17-2018. Exploratory back surgery 07/23  PAIN:  Are you having pain? Yes: NPRS scale: at rest after a pain pill 1/10  but when moving or decrease pain  6/10    at very worst it would be 8-9/10/10 Pain location: Back pain on left is 6/10 and at worst 8-9/10  and down R leg   Pain description: Left back pain is stabbing.  Constantly  and   R leg pain wakes up at night with stabbing pain but during the day it is tingling Aggravating factors: walking, sleeping at night, sometimes pacing at night helps  but any moving  sitting max 30 min depending on chair.   Standing 10-15 min.  Walking  5 minutes Relieving factors: pacing sometimes for a short time  PRECAUTIONS: None  RED FLAGS: Bowel or bladder incontinence: No   WEIGHT BEARING RESTRICTIONS: No  FALLS:  Has patient fallen in last 6 months? No but pt reports feeling unsteady and stumbles at times  LIVING ENVIRONMENT: Lives with: lives with their spouse Lives in: House/apartment Stairs: Yes: External: 3 steps; on right going up Has following equipment at home: Single point cane grab bars  a 2 wheeled walker  OCCUPATION: retired  Customer service manager  PLOF: Independent with household mobility with device  PATIENT GOALS:  TPDN and I would like to be stronger and not feel feeble  NEXT MD VISIT: TBD  OBJECTIVE:   DIAGNOSTIC FINDINGS:  CLINICAL DATA:  Lumbar region back pain radiating to the right leg. Previous lumbosacral surgeries.   EXAM: CT LUMBAR SPINE WITHOUT CONTRAST   TECHNIQUE: Multidetector CT imaging of the lumbar spine was performed without intravenous contrast  administration. Multiplanar CT image reconstructions were also generated.   RADIATION DOSE REDUCTION: This exam was performed according to the departmental dose-optimization program which includes automated exposure control, adjustment of the mA and/or kV according to patient size and/or use of iterative reconstruction technique.   COMPARISON:  Radiography 09/05/2022.  CT 05/18/2022.   FINDINGS: Segmentation: 5 lumbar type vertebral bodies as numbered previously.   Alignment: Chronic fixed scoliotic curvature convex to the right. 2 mm degenerative retrolisthesis at L1-2 is unchanged.   Vertebrae: No new vertebral finding. See below  for details at each level.   Paraspinal and other soft tissues: No significant finding.   Disc levels:   T11-12 and T12-L1: Normal   L1-2: Chronic degenerative retrolisthesis of 2 mm. Disc degeneration with bulging of the disc worse on the left. Facet degeneration and hypertrophy. Stenosis of the lateral recesses and foramina, left worse than right. Similar appearance to the study of March.   L2-3 and L3-4: Previous posterior decompression, diskectomy and fusion. Solid union with sufficient patency of the canal and foramina.   L4-5: Previous posterior decompression, diskectomy and fusion. Solid union with sufficient patency of the canal and foramina.   L5-S1: Previous posterior decompression, diskectomy and fusion procedure. Continued evidence of nonunion with nitrogen gas in the disc space and lucency around the S1 screws, left more than right. Central canal as well decompressed. There is foraminal narrowing on the right as seen previously that could possibly affect the L5 nerve.   IMPRESSION: 1. No change since the study of March of this year. Previous posterior decompression, diskectomy and fusion procedures from L2-3 through L4-5 with solid union and sufficient patency of the canal and foramina. 2. Continued evidence of nonunion at the  L5-S1 level with nitrogen gas in the disc space and lucency around the S1 screws, left more than right. No change in foraminal narrowing on the right at L5-S1. 3. L1-2 degenerative disc disease with 2 mm degenerative retrolisthesis. Stenosis of the lateral recesses and foramina, left worse than right. Similar appearance to the study of March.     Electronically Signed   By: Paulina Fusi M.D.   On: 10/07/2022 13:55  PATIENT SURVEYS:  FOTE  36%  predicted 43% 11-20-22  44% SCREENING FOR RED FLAGS: Bowel or bladder incontinence: No  COGNITION: Overall cognitive status: Within functional limits for tasks assessed     SENSATION: Pt with bil neuropathy and avoids driving car due to not being able to feel the brake pedal  MUSCLE LENGTH: Hamstrings:  WFL   POSTURE: rounded shoulders, forward head, and left pelvic level higher  Left anterior innominate rotation  PALPATION: Pt with Left SI sensitivity and + sacral compression and distraction test.  Pt with decreased pain with R LAD for R radiculopathy.  Pt with pain with palpation over Right Lumbar l-3 to S1 TTP over Left SI jt LUMBAR ROM:   AROM eval 11-20-22  Flexion Fingertips to floor Fingertips to floor  Extension 20% L low back 50% low back  Right lateral flexion Fingertips 2 inch below knee jt line pulling pain on left Fingertips 2 inch below knee jt line pulling pain on lef  Left lateral flexion Fingertips to knee jt line pain on L Fingertips 1 inch below knee jt line pulling pain on right  Right rotation 50% 60%  Left rotation 50% 60%   (Blank rows = not tested)  LOWER EXTREMITY ROM:   All WNL unless other wise noted  Active  Right eval Left eval R/L 11-27-22 R/L 12-12-22 R/L 12-18-22  Hip flexion Standing 55  Standing  60 88/86 94/90 94/90   Hip extension       Hip abduction       Hip adduction       Hip internal rotation 20   25/33 25/33  Hip external rotation       Knee flexion       Knee extension        Ankle dorsiflexion       Ankle plantarflexion  Ankle inversion       Ankle eversion        (Blank rows = not tested)  LOWER EXTREMITY MMT:    MMT Right eval Left eval R/L 11-20-22 R/L 12-11-22  Hip flexion 3- 4- 4-/4- 4/4  Hip extension 3- 4- 4-/4- 4/4  Hip abduction 3- 4- 4-/4- 4/4  Hip adduction      Hip internal rotation      Hip external rotation      Knee flexion 4 4 4/4+ 4+/4+  Knee extension 4 4 4/4 4/4+  Ankle dorsiflexion      Ankle plantarflexion      Ankle inversion      Ankle eversion       (Blank rows = not tested)  LUMBAR SPECIAL TESTS:  NT due to lumbar surgery in June 2024  FUNCTIONAL TESTS:  5 times sit to stand: 30.06 sec   36ft with SPC 11-12-22 526.8 ft (1290 ft - 1755 ft) 11-22-22  6 MWT  642ft  ( norm 1290 ft - 1755 ft) 11-20-22 5 x STS   18.47 ec 12-18-22 5 x STS  14.36 sec 12-18-22   6 MWT 957 ft  (1290 ft - 1755 ft)  GAIT: Distance walked: 150 feet  then 99 ft for Assistive device utilized: Single point cane Level of assistance: Modified independence Comments: Pt walks cautiously and very slowly and unsteady , not confident with gait    TODAY'S TREATMENT:   [facility] Adult PT Treatment:                                                DATE: 12-18-22 Therapeutic Exercise: Bridging with ball 3 x 10 Bridge with leg extended R and L Bear plank 5 x 10 sec hold Abduction sidelying 2 x 10 on R and L Manual Therapy: STW of  left QL and SI/ left piriformis MET for Left SI Trigger Point Dry-Needling performed     by Garen Lah Treatment instructions: Expect mild to moderate muscle soreness. S/S of pneumothorax if dry needled over a lung field, and to seek immediate medical attention should they occur. Patient verbalized understanding of these instructions and education.  Patient Consent Given: Yes Education handout provided: Previously provided Muscles treated:  Left  QL and  left lumbar/sacral paraspinals. Left  piriformis Electrical stimulation performed: No Parameters: N/A Treatment response/outcome: twitch response noted, pt noted relief  Self Care: Pt with handwritten instructions for self mobilization and return demo for using at night for pain in left SI  Pt introduced to stretch out strap and use of ball for adductor squeeze at home and places to purchase items for home use.  [facility] Adult PT Treatment:                                                DATE: 12-12-22 Pt enters building ambulating independently using a SPC to ambulate. Treatment took place in water 3.8 to  4 ft 8 in.feet deep depending upon activity.  Pt entered and exited the pool via stair and handrails independently. Pt initiated Rx with 3/10 pain  R SI down leg. At end of session 1/10 Acclimating to water by walking back and  forth and side stepping using rainbow DB in bil UE for security and balance in water Aquatic Exercise:   Hip extension stomp on foot with yellow noodle on R and L 2 x 10 each Hip extension with yellow noodle at knee shorter lever arm 1 x 10 R and L Pt hip hinge using pool noodle  Gentle lumbar rotation with pool noodle yellow Back lengthening using yellow pool noodle Thoracic AROM at edge of pool Hip  flexion using strider band for resistance  1 x 10 on R and L Hip  abduction using strider band for resistance  1 x 10 on R and L Hip  adduction using strider band for resistance  1 x 10 on R and L Hip  extesion using strider band for resistance  1 x 10 on R and L Hip circles with CCW and CW   Water step ups on submerged step 10 x R and L  SL step in water without aquatic flotation to work on balance with turbulence in water  SL stand and kicking for balance without UE support Squats x 20    Bad Ragaz, Pt with lumbar belt around hips and nek doodle for neck support.  PT at torso and assisting with trunk left to right and vice versa to engage trunk muscles. PT then rotated trunk in order to engage abdominal  (internal and external obliques) Emphasis on breathing techniques to draw in abdominals for support.  Pt then utilizing posterior chain and engaging Hip extension and knee flexion with water resistance . LAD over BIL LE.     Pt requires the buoyancy of water for active assisted exercises with buoyancy supported for strengthening and AROM exercises. Hydrostatic pressure also supports joints by unweighting joint load by at least 50 % in 3-4 feet depth water. 80% in chest to neck deep water. Water will provide assistance with movement using the current and laminar flow while the buoyancy reduces weight bearing. Pt requires the viscosity of the water for resistance endurance  with strengthening exercises.     [facility] Adult PT Treatment:                                                DATE: 12-06-22 Therapeutic Exercise: STS with no UE support 10 x STS with 15 # KB 2 x 10 Bridge with ball   4 sets of  5 Hip Abduction with RTB  2 x 10 R and L Hip Extension with RTB 2 x  10 R and L Table top marching 10 Single limb cone tapping with Supervision for safety  Manual Therapy: STW of  BIl QL and over lumbar and sacral paraspinals Use of tennis ball over left piriformis  Left iliacus MET for Left SI Trigger Point Dry-Needling performed     by Garen Lah Treatment instructions: Expect mild to moderate muscle soreness. S/S of pneumothorax if dry needled over a lung field, and to seek immediate medical attention should they occur. Patient verbalized understanding of these instructions and education.  Patient Consent Given: Yes Education handout provided: Previously provided Muscles treated:  Bil  QL and  Bil lumbar/sacral paraspinals. Bil Gluteals and Left piriformis Electrical stimulation performed: No Parameters: N/A Treatment response/outcome: twitch response noted, pt noted relief  Self Care: Walking program Explanation for need for endurance with walking  10 min 2 - 3  x a day to decreased  sedentary behavior [facility] Adult PT Treatment:                                                DATE: 11-27-22 Therapeutic Exercise: Recumbent bike 7 min  level 2 Clamshell with Resistance  with RTB 3  - 10 reps STS with 15 # KB  1 x 10  then 25 #  2 x 5 with Close supervision by PT TA engagement x 10 Bridge with ball   1 x 10 Hip Abduction with RTB  2 x 10 R and L Hip Extension with RTB 2 x  10 R and L Forward Step Up with Counter Support 6 inch step 1 x 10  Self Care: Community resources for exercise post DC. Pt will join in next 3 weeks  Self care and prioritizing strengthening on land vs  water for muscle hypertrophy                                                                                                                               DATE: EVAL 10-23-22  issue HEP    PATIENT EDUCATION:  Education details: POC Explanation of findings, FOTO, issue HEP Person educated: Patient Education method: Explanation, Demonstration, Tactile cues, Verbal cues, and Handouts Education comprehension: verbalized understanding, returned demonstration, verbal cues required, tactile cues required, and needs further education  HOME EXERCISE PROGRAM: Access Code: [email or web address] URL: [email or web address] Date: 10/23/2022 Prepared by: Garen Lah  Exercises - Supine Pelvic Tilt  - 1 x daily - 7 x weekly - 3 sets - 10 reps - Supine Single Knee to Chest Stretch  - 2 x daily - 7 x weekly - 1 sets - 5 reps - 10 hold - Supine Lower Trunk Rotation  - 2 x daily - 7 x weekly - 1 sets - 5 reps - 20 hold - Sit to stand with sink support Movement snack  - 1 x daily - 7 x weekly - 3 sets - 10 reps Added 11-01-22  - Clamshell with Resistance  - 1 x daily - 7 x weekly - 3 sets - 10 reps - Hip Abduction with Resistance Loop  - 1 x daily - 7 x weekly - 3 sets - 10 reps - Hip Extension with Resistance Loop  - 1 x daily - 7 x weekly - 3 sets - 10 reps - Forward Step Up with Counter Support  - 1 x  daily - 7 x weekly - 3 sets - 10 reps Added 12-18-22  - Sidelying Hip Abduction  - 1 x daily - 7 x weekly - 3 sets - 10 reps - Bear Plank from [employer]  - 1 x daily - 7 x weekly - 1 sets - 5 reps - 5-15 sec hold ASSESSMENT:  CLINICAL IMPRESSION:  Ms Brandstetter enters clinic with 3/10 pain in Left SI.  Pt has begun sessions for personal trainer but still needs additional visits to reinforce pain management and lumbar stabilization exercises.  6 MWT improved 957 ft  (1290 ft - 1755 ft) Session focused on pt learning hot to self mobilize Left SI joint. And to utilize equipment to purchase as pt desires. Pt will benefit from reinforcement of lumbar stabilization reinforcement and LE strength to help pt be more stable and increase ambulation stability/balance as well as manage pain.   Pt consents to TPDN and is closely monitored throughout session.  Pt with decreased pain after Rx session.  Pt will benefit form additional weekly session for 5 weeks to complete and achieve LTG for maximizing functional mobility.        EVAL- Patient is a 77  y.o. female who was seen today for physical therapy evaluation and treatment for continuing pain post surgeries (2) and most recent . Laminectomy Bil PLIF L5/S1 08-10-22, Pt has difficulty walking and remains sedentary due to pain. She has hx of fibromyalgia and bil neuropathy with complaints of pain in bil bottom of feet.  She reports she does UE exercises but she does not feel safe standing and walking. Ms Wilhelm sleep is disturbed and she cannot sleep for longer than 2 hours at night.  Pt arrives at PT to try to get stronger and to improve her ability to move.  She seeks TPDN, HEP and aquatics to help her achieve her goals.  Pt also has some Positive signs of Left SI jt irritation. Pt will benefit from skilled PT to address her impairment and her immobility due to pain and weakness to return her to a more active lifestyle and continuance of healthy habits.  OBJECTIVE  IMPAIRMENTS: decreased activity tolerance, decreased balance, decreased mobility, difficulty walking, decreased ROM, decreased strength, postural dysfunction, obesity, and pain.   ACTIVITY LIMITATIONS: bending, standing, squatting, sleeping, stairs, transfers, bed mobility, and locomotion level  PARTICIPATION LIMITATIONS: meal prep, cleaning, laundry, shopping, and community activity  PERSONAL FACTORS: Spinal stenosis, neuropathy in bil feet, low kidney fx, Hypothyroidism HTN gout , Fibromyalgia, depression, OA. Osteopenia, lumbar fusion 2021, Laminectomy Bi PLIF L5/s1 08-10-22, carpal tunnel R 10-17-2018. Exploratory back surgery 07/23 are also affecting patient's functional outcome.   REHAB POTENTIAL:Good but challenging due to bil neuropathy and multiple back surgeries  CLINICAL DECISION MAKING: Evolving/moderate complexity  EVALUATION COMPLEXITY: Moderate   GOALS: Goals reviewed with patient? Yes  SHORT TERM GOALS: Target date: 11-20-22  Pt will be I with initial HEP Baseline:no knowledge Goal status:MET  2.  Pt pain will be decreased  by 25% Baseline: 9/10 at eval at worst. 11-12-22  at worst now still a 9/10 11-20-22  Pt 3/10 today  Goal status: MET  3.  Pt will be able to sleep at night for 3 or more hours of consecutive sleep without awakening due to pain Baseline: wake every 2-3 hours 11-12-22 Pt had bad weekend and waking every 2 hours and went back to sleep for 3 hours 11-20-22 my Right leg has pain I got up and did stretches and it helped  I sleep  3 hours Goal status:MET  4.  Pt will be able to perform 6 MWT without fatigueing Baseline:  fatigued after 2 MWT  99 ft 11-12-22 526.8 ft (91290 ft - 1755 ft) Goal status:MET  5.  Pt will be able to be educated in aquatic therapy for exercise in environment with decreased  joint loading.  Baseline: No knowledge about aquatics 11-12-22  has been to aquatics x 2 Goal status:MET   LONG TERM GOALS: Target date: 12-18-22   revised 01-24-23  Pt will be able to be I with advanced HEP Baseline:  Goal status: ONGOING  2.  Pt pain will decrease for functional activities  and household chores by 50% Baseline: unable to stand for 5 min and cannot complete chores 11-22-22   Goal status: INITIAL  3.  Pt will be able to perform 5 x STS with 15 sec or less to show increased LE strength Baseline: 30.06 sec 11-20-22 5 x STS   18.47 ec 12-18-22  5 x STS  14.36 sec Goal status: MET  4.   Pt will be able to lift 20 #  KB from the floor, not limited by pain, to allow completion of housework  Baseline: unable to lift items from floor 11-27-22   using 15# for STS and 25# for STS for 5x to progress 12-18-22  limited  by pain with 15 # but getting better,  15 -20 min and then rest  Goal status: REVISED  5.  Pt will report 4 or more hours of uninterrupted sleep due to pain Baseline: Awakes every 2-3 hours due to pain at night 11-20-22  3 hours Goal status:ONGOING  6.  FOTO will improve from  36%  to 43%    indicating improved functional mobility.  Baseline: EVAL 36% 11-20-22  44% Goal status: MET  7. Pt will demonstrate self mobilization of R SI to insure better sleep and pain modulation in order to continue functional movement  Baseline  Pt needs PT to direct   Goal status INITIAL  8. Pt will improve 6 MWT to at least 1000 ft or more  Baseline EVal 99 ft in 2 MWT and then 11-12-22 526.8 ft   Goal  STatus  INITIAL  PLAN:  PT FREQUENCY: 1-2x/week  PT DURATION: 8 weeks  5 weeks extension  PLANNED INTERVENTIONS: Therapeutic exercises, Therapeutic activity, Neuromuscular re-education, Balance training, Gait training, Patient/Family education, Self Care, Joint mobilization, Stair training, DME instructions, Aquatic Therapy, Dry Needling, Electrical stimulation, Spinal mobilization, Cryotherapy, Moist heat, Taping, Manual therapy, and Re-evaluation.  PLAN FOR NEXT SESSION: TPDN. Aquatics  HEP  Left SI RX  Garen Lah, PT, ATRIC Certified Exercise Expert for the Aging Adult  12/18/22 4:55 PM Phone: [phone] Fax: [phone]

## 2022-12-18 ENCOUNTER — Ambulatory Visit: Payer: Medicare HMO | Admitting: Physical Therapy

## 2022-12-18 ENCOUNTER — Encounter: Payer: Self-pay | Admitting: Physical Therapy

## 2022-12-18 DIAGNOSIS — M533 Sacrococcygeal disorders, not elsewhere classified: Secondary | ICD-10-CM

## 2022-12-18 DIAGNOSIS — M6281 Muscle weakness (generalized): Secondary | ICD-10-CM

## 2022-12-18 DIAGNOSIS — G8929 Other chronic pain: Secondary | ICD-10-CM

## 2022-12-18 DIAGNOSIS — M5441 Lumbago with sciatica, right side: Secondary | ICD-10-CM | POA: Diagnosis not present

## 2022-12-18 DIAGNOSIS — R2681 Unsteadiness on feet: Secondary | ICD-10-CM

## 2022-12-24 ENCOUNTER — Other Ambulatory Visit: Payer: Self-pay

## 2022-12-24 DIAGNOSIS — E782 Mixed hyperlipidemia: Secondary | ICD-10-CM

## 2022-12-24 MED ORDER — ROSUVASTATIN CALCIUM 10 MG PO TABS
ORAL_TABLET | ORAL | 2 refills | Status: DC
Start: 2022-12-24 — End: 2023-03-18

## 2023-01-01 ENCOUNTER — Ambulatory Visit: Payer: Medicare HMO | Admitting: Physical Therapy

## 2023-01-02 ENCOUNTER — Encounter: Payer: Self-pay | Admitting: Physical Therapy

## 2023-01-02 ENCOUNTER — Ambulatory Visit: Payer: Medicare HMO | Admitting: Physical Therapy

## 2023-01-02 DIAGNOSIS — R2681 Unsteadiness on feet: Secondary | ICD-10-CM

## 2023-01-02 DIAGNOSIS — M6281 Muscle weakness (generalized): Secondary | ICD-10-CM

## 2023-01-02 DIAGNOSIS — M533 Sacrococcygeal disorders, not elsewhere classified: Secondary | ICD-10-CM

## 2023-01-02 DIAGNOSIS — M5441 Lumbago with sciatica, right side: Secondary | ICD-10-CM | POA: Diagnosis not present

## 2023-01-02 DIAGNOSIS — G8929 Other chronic pain: Secondary | ICD-10-CM

## 2023-01-07 ENCOUNTER — Telehealth: Payer: Self-pay | Admitting: Cardiology

## 2023-01-07 MED ORDER — METOPROLOL SUCCINATE ER 50 MG PO TB24: 50.0000 mg | ORAL_TABLET | Freq: Every day | ORAL | 2 refills | Status: DC

## 2023-01-07 NOTE — Telephone Encounter (Signed)
*  STAT* If patient is at the pharmacy, call can be transferred to refill team.   1. Which medications need to be refilled? (please list name of each medication and dose if known)   metoprolol succinate (TOPROL-XL) 50 MG 24 hr tablet   2. Would you like to learn more about the convenience, safety, & potential cost savings by using the [facility]?   3. Are you open to using the [facility] (Type [facility]. ).  4. Which pharmacy/location (including street and city if local pharmacy) is medication to be sent to?  [id number] - [place], [place] - [address] AT [facility] [address]   5. Do they need a 30 day or 90 day supply?   90 day  Patient stated she has 3-4 tablets left.

## 2023-01-08 ENCOUNTER — Ambulatory Visit: Payer: Medicare HMO | Admitting: Physical Therapy

## 2023-01-15 ENCOUNTER — Ambulatory Visit: Payer: Medicare HMO | Admitting: Physical Therapy

## 2023-01-22 ENCOUNTER — Encounter: Payer: Medicare HMO | Admitting: Physical Therapy

## 2023-01-26 ENCOUNTER — Other Ambulatory Visit: Payer: Self-pay | Admitting: Cardiology

## 2023-01-26 DIAGNOSIS — I10 Essential (primary) hypertension: Secondary | ICD-10-CM

## 2023-02-11 ENCOUNTER — Other Ambulatory Visit: Payer: Self-pay | Admitting: Internal Medicine

## 2023-02-11 DIAGNOSIS — I10 Essential (primary) hypertension: Secondary | ICD-10-CM

## 2023-02-25 ENCOUNTER — Other Ambulatory Visit: Payer: Self-pay

## 2023-02-25 DIAGNOSIS — I10 Essential (primary) hypertension: Secondary | ICD-10-CM

## 2023-02-25 MED ORDER — LOSARTAN POTASSIUM 100 MG PO TABS
100.0000 mg | ORAL_TABLET | Freq: Every day | ORAL | 2 refills | Status: DC
Start: 2023-02-25 — End: 2024-01-22

## 2023-03-07 ENCOUNTER — Other Ambulatory Visit: Payer: Self-pay | Admitting: Endocrinology

## 2023-03-07 DIAGNOSIS — E039 Hypothyroidism, unspecified: Secondary | ICD-10-CM

## 2023-03-11 ENCOUNTER — Ambulatory Visit
Admission: RE | Admit: 2023-03-11 | Discharge: 2023-03-11 | Disposition: A | Discharge status: Home / Self Care | Payer: Medicare HMO | Source: Ambulatory Visit | Attending: Endocrinology | Admitting: Endocrinology

## 2023-03-11 ENCOUNTER — Other Ambulatory Visit: Payer: Medicare HMO

## 2023-03-11 DIAGNOSIS — E039 Hypothyroidism, unspecified: Secondary | ICD-10-CM

## 2023-03-18 ENCOUNTER — Telehealth: Payer: Self-pay | Admitting: Cardiology

## 2023-03-18 DIAGNOSIS — E782 Mixed hyperlipidemia: Secondary | ICD-10-CM

## 2023-03-18 MED ORDER — ROSUVASTATIN CALCIUM 10 MG PO TABS
ORAL_TABLET | ORAL | 2 refills | Status: DC
Start: 2023-03-18 — End: 2023-12-18

## 2023-03-18 NOTE — Telephone Encounter (Signed)
*  STAT* If patient is at the pharmacy, call can be transferred to refill team.   1. Which medications need to be refilled? (please list name of each medication and dose if known)   rosuvastatin  (CRESTOR ) 10 MG tablet     2. Would you like to learn more about the convenience, safety, & potential cost savings by using the [facility]? No   3. Are you open to using the [facility] (Type [facility]. ) No   4. Which pharmacy/location (including street and city if local pharmacy) is medication to be sent to?  [id number] - [place], [place] - [address] Phone: [phone]  Fax: [phone]       5. Do they need a 30 day or 90 day supply? 90 day

## 2023-07-17 ENCOUNTER — Telehealth: Payer: Self-pay | Admitting: Cardiology

## 2023-07-17 NOTE — Telephone Encounter (Signed)
 Spoke with pt, she had lightheadedness and felt tired last night. She saw her medical doctor today and her bp and heart rate was low. Her medical doctor told her to call us . She still has some general lightheadedness today that does not change with lying down or turning her head. She reports she is drinking plenty of fluids. She feels she maybe on too many medications. Aware will forward for dr Berry Bristol to review.

## 2023-07-17 NOTE — Telephone Encounter (Signed)
 STAT if HR is under 50 or over 120  (normal HR is 60-100 beats per minute)  What is your heart rate?  Saw PCP this morning and HR was 50, BP was 104/60. Patient has not checked her HR at home. She says she doesn't know how to.  Do you have a log of your heart rate readings (document readings)?   Do you have any other symptoms?   Lightheadedness and tiredness since yesterday.

## 2023-07-18 ENCOUNTER — Other Ambulatory Visit (HOSPITAL_COMMUNITY): Payer: Self-pay

## 2023-07-18 MED ORDER — METOPROLOL SUCCINATE ER 50 MG PO TB24
25.0000 mg | ORAL_TABLET | Freq: Every day | ORAL | 2 refills | Status: DC
  Filled 2023-07-18: qty 45, 90d supply, fill #0

## 2023-07-18 NOTE — Telephone Encounter (Signed)
 She can reduce Metoprolol  succinate 50 mg to 1/2 tab daily

## 2023-07-18 NOTE — Telephone Encounter (Signed)
 Pt advised and verbalized understanding.

## 2023-07-26 NOTE — Telephone Encounter (Signed)
 Patient called to report she has been taking half tablet of metoprolol  succinate (TOPROL -XL) 50 MG 24 hr tablet and her HR has increased to the 60's.

## 2023-07-30 ENCOUNTER — Other Ambulatory Visit: Payer: Self-pay | Admitting: Obstetrics & Gynecology

## 2023-07-30 DIAGNOSIS — Z1231 Encounter for screening mammogram for malignant neoplasm of breast: Secondary | ICD-10-CM

## 2023-08-26 ENCOUNTER — Ambulatory Visit
Admission: RE | Admit: 2023-08-26 | Discharge: 2023-08-26 | Discharge status: Home / Self Care | Source: Ambulatory Visit | Attending: Obstetrics & Gynecology | Admitting: Obstetrics & Gynecology

## 2023-08-26 DIAGNOSIS — Z1231 Encounter for screening mammogram for malignant neoplasm of breast: Secondary | ICD-10-CM

## 2023-10-04 ENCOUNTER — Other Ambulatory Visit: Payer: Self-pay

## 2023-10-04 MED ORDER — METOPROLOL SUCCINATE ER 50 MG PO TB24: 25.0000 mg | ORAL_TABLET | Freq: Every day | ORAL | 0 refills | Status: DC

## 2023-10-29 ENCOUNTER — Other Ambulatory Visit: Payer: Self-pay | Admitting: Cardiology

## 2023-10-29 DIAGNOSIS — I10 Essential (primary) hypertension: Secondary | ICD-10-CM

## 2023-11-05 ENCOUNTER — Other Ambulatory Visit: Payer: Self-pay

## 2023-11-06 MED ORDER — METOPROLOL SUCCINATE ER 50 MG PO TB24: 25.0000 mg | ORAL_TABLET | Freq: Every day | ORAL | 0 refills | Status: DC

## 2023-11-13 ENCOUNTER — Other Ambulatory Visit: Payer: Self-pay | Admitting: Neurosurgery

## 2023-11-13 ENCOUNTER — Encounter: Payer: Self-pay | Admitting: Neurosurgery

## 2023-11-13 DIAGNOSIS — M5440 Lumbago with sciatica, unspecified side: Secondary | ICD-10-CM

## 2023-11-15 ENCOUNTER — Inpatient Hospital Stay
Admission: RE | Admit: 2023-11-15 | Discharge: 2023-11-15 | Discharge status: Home / Self Care | Source: Ambulatory Visit | Attending: Neurosurgery | Admitting: Neurosurgery

## 2023-11-15 DIAGNOSIS — M5440 Lumbago with sciatica, unspecified side: Secondary | ICD-10-CM

## 2023-12-09 ENCOUNTER — Other Ambulatory Visit: Payer: Self-pay

## 2023-12-09 DIAGNOSIS — E782 Mixed hyperlipidemia: Secondary | ICD-10-CM

## 2023-12-16 ENCOUNTER — Other Ambulatory Visit: Payer: Self-pay | Admitting: Cardiology

## 2023-12-16 DIAGNOSIS — E782 Mixed hyperlipidemia: Secondary | ICD-10-CM

## 2023-12-18 ENCOUNTER — Telehealth: Payer: Self-pay | Admitting: Cardiology

## 2023-12-18 DIAGNOSIS — E782 Mixed hyperlipidemia: Secondary | ICD-10-CM

## 2023-12-18 MED ORDER — ROSUVASTATIN CALCIUM 10 MG PO TABS
ORAL_TABLET | ORAL | 0 refills | Status: DC
Start: 2023-12-18 — End: 2023-12-24

## 2023-12-18 NOTE — Telephone Encounter (Signed)
 RX sent in

## 2023-12-18 NOTE — Telephone Encounter (Signed)
*  STAT* If patient is at the pharmacy, call can be transferred to refill team.   1. Which medications need to be refilled? (please list name of each medication and dose if known)   rosuvastatin  (CRESTOR ) 10 MG tablet    2. Which pharmacy/location (including street and city if local pharmacy) is medication to be sent to?  [id number] - [place], [place] - [address]     3. Do they need a 30 day or 90 day supply? 90 day

## 2023-12-23 ENCOUNTER — Other Ambulatory Visit: Payer: Self-pay

## 2023-12-24 ENCOUNTER — Encounter: Payer: Self-pay | Admitting: Physician Assistant

## 2023-12-24 ENCOUNTER — Ambulatory Visit: Attending: Physician Assistant | Admitting: Physician Assistant

## 2023-12-24 VITALS — BP 138/70 | HR 59 | Resp 16 | Ht 65.0 in | Wt 189.8 lb

## 2023-12-24 DIAGNOSIS — R002 Palpitations: Secondary | ICD-10-CM | POA: Diagnosis not present

## 2023-12-24 DIAGNOSIS — E782 Mixed hyperlipidemia: Secondary | ICD-10-CM | POA: Diagnosis not present

## 2023-12-24 DIAGNOSIS — I10 Essential (primary) hypertension: Secondary | ICD-10-CM

## 2023-12-24 MED ORDER — ROSUVASTATIN CALCIUM 10 MG PO TABS: ORAL_TABLET | ORAL | 3 refills | Status: AC

## 2023-12-24 MED ORDER — METOPROLOL SUCCINATE ER 25 MG PO TB24: 25.0000 mg | ORAL_TABLET | Freq: Every day | ORAL | 3 refills | Status: AC

## 2023-12-24 NOTE — Progress Notes (Signed)
 Cardiology Office Note   Date:  12/24/2023  ID:  SAKARI RAISANEN, DOB 1945-07-23, MRN [id number] PCP: Melba Lamarr BRAVO, NP  [facility] Providers Cardiologist:  None   History of Present Illness Patricia Parks is a 78 y.o. female with a past medical history of fibromyalgia, anxiety and depression, hypercholesteremia and hypertension as well as palpitations here for follow-up appointment.  She was last seen in August 2024 and at that time he recently had back surgery and was still continuing to have severe pain and also had been sick.  On Percocet almost every 4 hours due to severe pain.  Denied any palpitations chest pain dyspnea or leg edema.   Patricia Parks is a 78 year old female presents for medication refills and management of hypertension and palpitations.  She experiences muscle cramps when she runs out of her statin medication, Crestor , 10 mg daily. Her last refill was only for 15 tablets, which she found inconvenient.  Her antihypertensive regimen includes chlorthalidone  12.5 mg daily, amlodipine  5 mg daily, losartan  100 mg daily, and metoprolol  25 mg daily. Her blood pressure tends to rise with anxiety, particularly when leaving the house or visiting the doctor. Her metoprolol  dose was reduced to 25 mg due to low blood pressure, but she has difficulty cutting the tablets as they crumble.  She experiences palpitations mostly when leaving the house, attributed to anxiety from her inability to drive due to lack of feeling in her right leg. This dependency on others for transportation contributes to her anxiety, especially during medical appointments.  Reports no shortness of breath nor dyspnea on exertion. Reports no chest pain, pressure, or tightness. No edema, orthopnea, PND.  Discussed the use of AI scribe software for clinical note transcription with the patient, who gave verbal consent to proceed.  ROS: Pertinent ROS in HPI  Studies Reviewed EKG  Interpretation Date/Time:  Tuesday December 24 2023 14:22:03 EDT Ventricular Rate:  61 PR Interval:  162 QRS Duration:  88 QT Interval:  418 QTC Calculation: 420 R Axis:   -39  Text Interpretation: Normal sinus rhythm Left axis deviation Cannot rule out Anterior infarct , age undetermined When compared with ECG of 07-Aug-2022 15:16, Premature ventricular complexes are no longer Present Nonspecific T wave abnormality, worse in Lateral leads Confirmed by Lucien Blanc [phone]) on 12/24/2023 2:48:24 PM    PCV ECHOCARDIOGRAM COMPLETE 07/11/2020   Narrative Echocardiogram 07/11/2020: Left ventricle cavity is normal in size. Mild concentric hypertrophy of the left ventricle. Normal global wall motion. Normal LV systolic function with EF 59%. Doppler evidence of grade I (impaired) diastolic dysfunction, normal LAP. Mild pulmonic regurgitation. No evidence of pulmonary hypertension.    Lexiscan Myoview Stress Test 08/06/2018: Nondiagnostic ECG stress due to pharmacologic stress protocol. Normal myocardial perfusion.  Stress LV EF: 72%.  Low risk study.   Carotid artery duplex 06/14/2021: No evidence of significant stenosis in the right carotid vessels. Duplex suggests stenosis in the left internal carotid artery (16-49%). There is no significant plaque burden. This study may represent FMD (mild). Antegrade right vertebral artery flow. Antegrade left vertebral artery flow. No significant change 06/07/2020.       Physical Exam VS:  BP 138/70 (BP Location: Left Arm, Patient Position: Sitting, Cuff Size: Large)   Pulse (!) 59   Resp 16   Ht 5' 5 (1.651 m)   Wt 189 lb 12.8 oz (86.1 kg)   LMP 03/05/2000   SpO2 97%   BMI 31.58 kg/m  Wt Readings from Last 3 Encounters:  12/24/23 189 lb 12.8 oz (86.1 kg)  10/31/22 194 lb 6.4 oz (88.2 kg)  09/05/22 195 lb 1.7 oz (88.5 kg)    GEN: Well nourished, well developed in no acute distress NECK: No JVD; No carotid bruits CARDIAC: RRR, no  murmurs, rubs, gallops RESPIRATORY:  Clear to auscultation without rales, wheezing or rhonchi  ABDOMEN: Soft, non-tender, non-distended EXTREMITIES:  No edema; No deformity   ASSESSMENT AND PLAN  Essential hypertension Blood pressure controlled at 138/70 mmHg. Anxiety-induced spikes noted. - Increase chlorthalidone  to 25 mg on days of increased anxiety or before procedures.  -Otherwise, continue chlorthalidone  12.5 mg daily, amlodipine  5 mg daily, metoprolol  succinate 25 mg daily  Palpitations Palpitations linked to anxiety. Heart rate controlled at 61 bpm. EKG normal sinus rhythm. - Continue current medication regimen. - Prescribed metoprolol  25 mg tablets for accurate dosing.      Dispo: She can follow-up with Dr. Ladona in a year  Signed, Orren LOISE Fabry, PA-C

## 2023-12-24 NOTE — Patient Instructions (Signed)
 Medication Instructions:  Your physician recommends that you continue on your current medications as directed. Please refer to the Current Medication list given to you today. *If you need a refill on your cardiac medications before your next appointment, please call your pharmacy*  Lab Work: None ordered If you have labs (blood work) drawn today and your tests are completely normal, you will receive your results only by: MyChart Message (if you have MyChart) OR A paper copy in the mail If you have any lab test that is abnormal or we need to change your treatment, we will call you to review the results.  Testing/Procedures: None ordered  Follow-Up: At [facility], you and your health needs are our priority.  As part of our continuing mission to provide you with exceptional heart care, our providers are all part of one team.  This team includes your primary Cardiologist (physician) and Advanced Practice Providers or APPs (Physician Assistants and Nurse Practitioners) who all work together to provide you with the care you need, when you need it.  Your next appointment:   12 month(s)  Provider:   Gordy Bergamo, MD    We recommend signing up for the patient portal called MyChart.  Sign up information is provided on this After Visit Summary.  MyChart is used to connect with patients for Virtual Visits (Telemedicine).  Patients are able to view lab/test results, encounter notes, upcoming appointments, etc.  Non-urgent messages can be sent to your provider as well.   To learn more about what you can do with MyChart, go to [email or web address].   Other Instructions

## 2023-12-30 ENCOUNTER — Other Ambulatory Visit: Payer: Self-pay

## 2023-12-30 DIAGNOSIS — E782 Mixed hyperlipidemia: Secondary | ICD-10-CM

## 2024-01-20 ENCOUNTER — Other Ambulatory Visit: Payer: Self-pay | Admitting: Cardiology

## 2024-01-20 DIAGNOSIS — I10 Essential (primary) hypertension: Secondary | ICD-10-CM

## 2024-01-30 ENCOUNTER — Other Ambulatory Visit: Payer: Self-pay | Admitting: Cardiology

## 2024-04-02 ENCOUNTER — Telehealth (HOSPITAL_BASED_OUTPATIENT_CLINIC_OR_DEPARTMENT_OTHER): Payer: Self-pay | Admitting: Obstetrics & Gynecology

## 2024-04-02 DIAGNOSIS — M8580 Other specified disorders of bone density and structure, unspecified site: Secondary | ICD-10-CM

## 2024-04-02 NOTE — Telephone Encounter (Signed)
 Spoke with patient. Patient would like a referral to imaging department downstairs for bone density imaging. Order for bone density placed. Patient verbalizes understanding and is aware they will contact her to schedule or she can reach out to them at [phone] at her convenience.

## 2024-04-02 NOTE — Telephone Encounter (Signed)
 Patient would ike Dr. Cleotilde to put in a referral for a Bone Density Test.

## 2024-09-01 ENCOUNTER — Ambulatory Visit (HOSPITAL_BASED_OUTPATIENT_CLINIC_OR_DEPARTMENT_OTHER): Admitting: Obstetrics & Gynecology
